# Patient Record
Sex: Male | Born: 1940 | Race: Black or African American | Hispanic: No | Marital: Married | Smoking: Former smoker
Health system: Southern US, Community
[De-identification: ages and names within clinical notes are randomized; demographics above are authoritative.]

## PROBLEM LIST (undated history)

## (undated) DIAGNOSIS — G8929 Other chronic pain: Secondary | ICD-10-CM

## (undated) DIAGNOSIS — Z972 Presence of dental prosthetic device (complete) (partial): Secondary | ICD-10-CM

## (undated) DIAGNOSIS — R42 Dizziness and giddiness: Secondary | ICD-10-CM

## (undated) DIAGNOSIS — N529 Male erectile dysfunction, unspecified: Secondary | ICD-10-CM

## (undated) DIAGNOSIS — M549 Dorsalgia, unspecified: Secondary | ICD-10-CM

## (undated) DIAGNOSIS — H409 Unspecified glaucoma: Secondary | ICD-10-CM

## (undated) DIAGNOSIS — Z905 Acquired absence of kidney: Secondary | ICD-10-CM

## (undated) DIAGNOSIS — C679 Malignant neoplasm of bladder, unspecified: Secondary | ICD-10-CM

## (undated) DIAGNOSIS — I10 Essential (primary) hypertension: Secondary | ICD-10-CM

## (undated) DIAGNOSIS — J309 Allergic rhinitis, unspecified: Secondary | ICD-10-CM

## (undated) DIAGNOSIS — M48061 Spinal stenosis, lumbar region without neurogenic claudication: Secondary | ICD-10-CM

## (undated) DIAGNOSIS — S36113A Laceration of liver, unspecified degree, initial encounter: Secondary | ICD-10-CM

## (undated) DIAGNOSIS — C61 Malignant neoplasm of prostate: Secondary | ICD-10-CM

## (undated) DIAGNOSIS — N400 Enlarged prostate without lower urinary tract symptoms: Secondary | ICD-10-CM

## (undated) HISTORY — PX: TRANSURETHRAL RESECTION OF BLADDER TUMOR WITH GYRUS (TURBT-GYRUS): SHX6458

## (undated) HISTORY — PX: BACK SURGERY: SHX140

## (undated) HISTORY — PX: COLONOSCOPY: SHX174

## (undated) HISTORY — PX: HERNIA REPAIR: SHX51

---

## 1976-11-16 HISTORY — PX: NEPHRECTOMY: SHX65

## 2004-09-05 ENCOUNTER — Other Ambulatory Visit: Payer: Self-pay

## 2004-09-10 ENCOUNTER — Ambulatory Visit: Payer: Self-pay | Admitting: Surgery

## 2005-02-14 DIAGNOSIS — Z86010 Personal history of colonic polyps: Secondary | ICD-10-CM | POA: Insufficient documentation

## 2005-03-13 ENCOUNTER — Ambulatory Visit: Payer: Self-pay | Admitting: Gastroenterology

## 2006-03-01 ENCOUNTER — Ambulatory Visit: Payer: Self-pay | Admitting: Urology

## 2006-03-01 ENCOUNTER — Other Ambulatory Visit: Payer: Self-pay

## 2006-03-10 ENCOUNTER — Ambulatory Visit: Payer: Self-pay | Admitting: Urology

## 2007-09-12 ENCOUNTER — Ambulatory Visit: Payer: Self-pay | Admitting: Urology

## 2008-03-27 ENCOUNTER — Other Ambulatory Visit: Payer: Self-pay

## 2008-03-27 ENCOUNTER — Ambulatory Visit: Payer: Self-pay | Admitting: Urology

## 2008-04-10 ENCOUNTER — Ambulatory Visit: Payer: Self-pay | Admitting: Urology

## 2008-08-01 ENCOUNTER — Ambulatory Visit: Payer: Self-pay | Admitting: Unknown Physician Specialty

## 2009-04-16 ENCOUNTER — Ambulatory Visit: Payer: Self-pay | Admitting: Radiation Oncology

## 2009-05-06 ENCOUNTER — Ambulatory Visit: Payer: Self-pay | Admitting: Urology

## 2009-05-08 ENCOUNTER — Ambulatory Visit: Payer: Self-pay | Admitting: Radiation Oncology

## 2009-05-16 ENCOUNTER — Ambulatory Visit: Payer: Self-pay | Admitting: Radiation Oncology

## 2009-05-22 ENCOUNTER — Ambulatory Visit: Payer: Self-pay | Admitting: Radiation Oncology

## 2009-06-16 ENCOUNTER — Ambulatory Visit: Payer: Self-pay | Admitting: Radiation Oncology

## 2009-07-17 ENCOUNTER — Ambulatory Visit: Payer: Self-pay | Admitting: Radiation Oncology

## 2009-08-16 ENCOUNTER — Ambulatory Visit: Payer: Self-pay | Admitting: Radiation Oncology

## 2009-09-16 ENCOUNTER — Ambulatory Visit: Payer: Self-pay | Admitting: Radiation Oncology

## 2009-12-17 ENCOUNTER — Ambulatory Visit: Payer: Self-pay | Admitting: Radiation Oncology

## 2010-01-13 ENCOUNTER — Ambulatory Visit: Payer: Self-pay | Admitting: Radiation Oncology

## 2010-01-14 ENCOUNTER — Ambulatory Visit: Payer: Self-pay | Admitting: Radiation Oncology

## 2010-03-16 ENCOUNTER — Ambulatory Visit: Payer: Self-pay | Admitting: Internal Medicine

## 2010-03-23 ENCOUNTER — Ambulatory Visit: Payer: Self-pay | Admitting: Family Medicine

## 2010-06-16 ENCOUNTER — Ambulatory Visit: Payer: Self-pay | Admitting: Radiation Oncology

## 2010-07-14 ENCOUNTER — Ambulatory Visit: Payer: Self-pay | Admitting: Radiation Oncology

## 2010-07-17 ENCOUNTER — Ambulatory Visit: Payer: Self-pay | Admitting: Radiation Oncology

## 2011-07-15 ENCOUNTER — Ambulatory Visit: Payer: Self-pay | Admitting: Radiation Oncology

## 2011-07-16 LAB — PSA: PSA: 0.4 ng/mL (ref 0.0–4.0)

## 2011-07-18 ENCOUNTER — Ambulatory Visit: Payer: Self-pay | Admitting: Radiation Oncology

## 2012-06-22 ENCOUNTER — Ambulatory Visit: Payer: Self-pay | Admitting: Unknown Physician Specialty

## 2012-07-15 ENCOUNTER — Ambulatory Visit: Payer: Self-pay | Admitting: Radiation Oncology

## 2012-07-17 ENCOUNTER — Ambulatory Visit: Payer: Self-pay | Admitting: Radiation Oncology

## 2012-08-03 DIAGNOSIS — M5126 Other intervertebral disc displacement, lumbar region: Secondary | ICD-10-CM | POA: Insufficient documentation

## 2012-09-26 DIAGNOSIS — N529 Male erectile dysfunction, unspecified: Secondary | ICD-10-CM | POA: Insufficient documentation

## 2012-09-26 DIAGNOSIS — Z8551 Personal history of malignant neoplasm of bladder: Secondary | ICD-10-CM | POA: Insufficient documentation

## 2013-07-14 ENCOUNTER — Ambulatory Visit: Payer: Self-pay | Admitting: Radiation Oncology

## 2013-07-15 LAB — PSA: PSA: 1.3 ng/mL (ref 0.0–4.0)

## 2013-07-17 ENCOUNTER — Ambulatory Visit: Payer: Self-pay | Admitting: Radiation Oncology

## 2013-09-27 ENCOUNTER — Ambulatory Visit: Payer: Self-pay | Admitting: Gastroenterology

## 2014-05-23 ENCOUNTER — Ambulatory Visit: Payer: Self-pay | Admitting: Urology

## 2014-06-20 DIAGNOSIS — E785 Hyperlipidemia, unspecified: Secondary | ICD-10-CM | POA: Insufficient documentation

## 2014-07-11 DIAGNOSIS — I10 Essential (primary) hypertension: Secondary | ICD-10-CM | POA: Insufficient documentation

## 2014-07-11 DIAGNOSIS — J309 Allergic rhinitis, unspecified: Secondary | ICD-10-CM | POA: Insufficient documentation

## 2014-07-11 DIAGNOSIS — N400 Enlarged prostate without lower urinary tract symptoms: Secondary | ICD-10-CM | POA: Insufficient documentation

## 2014-07-12 ENCOUNTER — Ambulatory Visit: Payer: Self-pay | Admitting: Radiation Oncology

## 2014-07-12 DIAGNOSIS — G8929 Other chronic pain: Secondary | ICD-10-CM | POA: Insufficient documentation

## 2014-07-12 DIAGNOSIS — M549 Dorsalgia, unspecified: Secondary | ICD-10-CM | POA: Insufficient documentation

## 2014-07-19 ENCOUNTER — Ambulatory Visit: Payer: Self-pay | Admitting: Radiation Oncology

## 2014-08-16 ENCOUNTER — Ambulatory Visit: Payer: Self-pay | Admitting: Radiation Oncology

## 2015-01-11 ENCOUNTER — Ambulatory Visit: Payer: Self-pay | Admitting: Radiation Oncology

## 2015-07-15 ENCOUNTER — Ambulatory Visit: Payer: Medicare Other | Attending: Radiation Oncology | Admitting: Radiation Oncology

## 2015-07-15 ENCOUNTER — Inpatient Hospital Stay: Payer: Medicare Other | Attending: Radiation Oncology

## 2015-07-15 ENCOUNTER — Other Ambulatory Visit: Payer: Medicare Other | Admitting: *Deleted

## 2015-07-15 DIAGNOSIS — C61 Malignant neoplasm of prostate: Secondary | ICD-10-CM

## 2015-07-24 DIAGNOSIS — Z79899 Other long term (current) drug therapy: Secondary | ICD-10-CM | POA: Insufficient documentation

## 2016-02-10 ENCOUNTER — Encounter: Payer: Self-pay | Admitting: Radiation Oncology

## 2016-02-10 ENCOUNTER — Ambulatory Visit
Admission: RE | Admit: 2016-02-10 | Discharge: 2016-02-10 | Disposition: A | Discharge status: Home / Self Care | Payer: Medicare Other | Source: Ambulatory Visit | Attending: Radiation Oncology | Admitting: Radiation Oncology

## 2016-02-10 ENCOUNTER — Inpatient Hospital Stay: Payer: Medicare Other | Attending: Radiation Oncology

## 2016-02-10 ENCOUNTER — Other Ambulatory Visit: Payer: Self-pay | Admitting: *Deleted

## 2016-02-10 VITALS — BP 125/73 | HR 59 | Temp 98.0°F | Wt 222.4 lb

## 2016-02-10 DIAGNOSIS — Z923 Personal history of irradiation: Secondary | ICD-10-CM | POA: Diagnosis not present

## 2016-02-10 DIAGNOSIS — C775 Secondary and unspecified malignant neoplasm of intrapelvic lymph nodes: Secondary | ICD-10-CM | POA: Diagnosis not present

## 2016-02-10 DIAGNOSIS — Z79818 Long term (current) use of other agents affecting estrogen receptors and estrogen levels: Secondary | ICD-10-CM | POA: Insufficient documentation

## 2016-02-10 DIAGNOSIS — C61 Malignant neoplasm of prostate: Secondary | ICD-10-CM | POA: Insufficient documentation

## 2016-02-10 HISTORY — DX: Essential (primary) hypertension: I10

## 2016-02-10 HISTORY — DX: Allergic rhinitis, unspecified: J30.9

## 2016-02-10 HISTORY — DX: Other chronic pain: G89.29

## 2016-02-10 HISTORY — DX: Malignant neoplasm of prostate: C61

## 2016-02-10 HISTORY — DX: Spinal stenosis, lumbar region without neurogenic claudication: M48.061

## 2016-02-10 HISTORY — DX: Dorsalgia, unspecified: M54.9

## 2016-02-10 HISTORY — DX: Male erectile dysfunction, unspecified: N52.9

## 2016-02-10 HISTORY — DX: Malignant neoplasm of bladder, unspecified: C67.9

## 2016-02-10 HISTORY — DX: Benign prostatic hyperplasia without lower urinary tract symptoms: N40.0

## 2016-02-10 LAB — PSA: PSA: 3.64 ng/mL (ref 0.00–4.00)

## 2016-02-10 MED ORDER — TAMSULOSIN HCL 0.4 MG PO CAPS: 0.4000 mg | ORAL_CAPSULE | Freq: Every day | ORAL | Status: DC

## 2016-02-10 NOTE — Progress Notes (Signed)
Radiation Oncology Follow up Note  Name: James Lucas   Date:   02/10/2016 MRN:  [id number] DOB: [birth year]    This 75 y.o. 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 He is seen today in routine follow-up. About a year ago he started having progression of his PSA we started him on Lupron 4 month Depo. Bone scan at that time showed no evidence of metastatic disease. Patient's PSA has slowly climbed last was 3.71 in January 2017. He also having some urinary frequency urgency and nocturia 2-3.  COMPLICATIONS OF TREATMENT: none  FOLLOW UP COMPLIANCE: keeps appointments   PHYSICAL EXAM:  BP 125/73 mmHg  Pulse 59  Temp(Src) 98 F (36.7 C)  Wt 222 lb 7.1 oz (100.9 kg) On rectal exam rectal sphincter tone is good. Prostate is smooth contracted without evidence of nodularity or mass. Sulcus is preserved bilaterally. No discrete nodularity is identified. No other rectal abnormalities are noted. Well-developed well-nourished patient in NAD. HEENT reveals PERLA, EOMI, discs not visualized.  Oral cavity is clear. No oral mucosal lesions are identified. Neck is clear without evidence of cervical or supraclavicular adenopathy. Lungs are clear to A&P. Cardiac examination is essentially unremarkable with regular rate and rhythm without murmur rub or thrill. Abdomen is benign with no organomegaly or masses noted. Motor sensory and DTR levels are equal and symmetric in the upper and lower extremities. Cranial nerves II through XII are grossly intact. Proprioception is intact. No peripheral adenopathy or edema is identified. No motor or sensory levels are noted. Crude visual fields are within normal  range.  RADIOLOGY RESULTS: No current films for review  PLAN: At this time patient is not on Lupron therapy is being followed by urologists in [place]. I am concerned about his tripling of PSA over the past year. Would opt to pulse him with Lupron therapy although he is under the care of urologists in [place] and they are following this. He has another PSA schedule in 3 months. I've asked to see him shortly thereafter that for the consideration of his treatment plan. He's having no bony pain at this time. I'm starting him on Flomax to help release some of his nocturia and frequency issues. Patient is to call sooner with any concerns.  I would like to take this opportunity for allowing me to participate in the care of your patient.Armstead Peaks., MD

## 2016-06-12 ENCOUNTER — Ambulatory Visit
Admission: RE | Admit: 2016-06-12 | Discharge: 2016-06-12 | Disposition: A | Discharge status: Home / Self Care | Payer: Medicare Other | Source: Ambulatory Visit | Attending: Radiation Oncology | Admitting: Radiation Oncology

## 2016-06-12 ENCOUNTER — Encounter: Payer: Self-pay | Admitting: Radiation Oncology

## 2016-06-12 VITALS — BP 141/75 | HR 55 | Temp 96.6°F | Resp 20 | Ht 72.0 in | Wt 220.0 lb

## 2016-06-12 DIAGNOSIS — C779 Secondary and unspecified malignant neoplasm of lymph node, unspecified: Secondary | ICD-10-CM | POA: Insufficient documentation

## 2016-06-12 DIAGNOSIS — K5900 Constipation, unspecified: Secondary | ICD-10-CM | POA: Diagnosis not present

## 2016-06-12 DIAGNOSIS — R351 Nocturia: Secondary | ICD-10-CM | POA: Diagnosis not present

## 2016-06-12 DIAGNOSIS — C61 Malignant neoplasm of prostate: Secondary | ICD-10-CM | POA: Diagnosis present

## 2016-06-12 DIAGNOSIS — R9721 Rising PSA following treatment for malignant neoplasm of prostate: Secondary | ICD-10-CM | POA: Insufficient documentation

## 2016-06-12 NOTE — Progress Notes (Signed)
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 In 2016 he was noted to have progression of PSA and was started on Lupron. Bone scan was negative. PSA has continued to climb (see below) and most recently ws 5.5 on 04/22/16. At his last visit he was started on flomax for urgency and nocturia, he took this for about a month but stopped because he did not notice much improvement. He saw his urologist in [place] about a month ago and was urged to consider medonc referral but has not made an appointment yet. He denies new abdominal or bony pain, change in his urinary or bowel symptoms. He is occasionally constipated, denies BRBPR, has occasinoal urgency, nocturia x 3, and denies hematuria.  COMPLICATIONS OF TREATMENT: none  FOLLOW UP COMPLIANCE: keeps appointments   MEDS: reviewed as documented  EXAM: Vitals:   06/12/16 1035  BP: (!) 141/75  Pulse: (!) 55  Resp: 20  Temp: (!) 96.6 F (35.9 C)   Awake, alert, NAD. PERLA, EOMI, Oral cavity clear. Neck supple with LAD. Lungs clear. abd s/nt/nd/nabs. No pain with axial percussion. On rectal exam - sphincter tone is good. Prostate is smooth without nodularity or mass. No abnormalities noted. MEDICAL DECISION MAKING LABS: PSA history: 04/22/16  5.48 02/10/16  3.64 07/12/14 2.32 07/14/13 1.3 07/15/12 0.6 07/15/11 0.4  ASSESSMENT: 1. Prostate CA - continuing rise in PSA c/w biochemical recurrence. Not on active therapy currently.   PLAN:  1. Prostate CA - patient is amenable to seeing medical oncology to discuss current treatment options. Age and other comorbidities may limit options. Paitent has not heard back from [facility] and would prefer to make appointment here. Will make referral to medical oncology. 2. followup - followup  in 6 months  James Darting, MD PhD

## 2016-07-08 ENCOUNTER — Ambulatory Visit: Payer: Medicare Other | Admitting: Oncology

## 2016-07-09 DIAGNOSIS — C61 Malignant neoplasm of prostate: Secondary | ICD-10-CM | POA: Insufficient documentation

## 2016-07-09 NOTE — Progress Notes (Signed)
[facility]  [phone] Fax:(336) [phone]  ID: James Lucas OB: 1941-09-30  MR#: [id number]  [id number]  Patient Care Team: Ezequiel Kayser, MD as PCP - General (Internal Medicine)  CHIEF COMPLAINT: Prostate Cancer  INTERVAL HISTORY: Patient is a 75 year old male with a long-standing history of prostate cancer status post XRT was noted to have a slowly increasing PSA. Patient had been on Lupron before, but discontinued secondary to hot flashes. He currently feels well and is asymptomatic. He has no neurologic complaints. He denies any recent fevers or illnesses. He has a good appetite and denies weight loss. He denies any pain. He has no chest pain or shortness of breath. He denies any nausea, vomiting, constipation, or diarrhea. He has no urinary complaints. Patient feels at his baseline and offers no specific complaints today.  REVIEW OF SYSTEMS:   Review of Systems  Constitutional: Negative.  Negative for fever, malaise/fatigue and weight loss.  Respiratory: Negative.  Negative for cough and shortness of breath.   Cardiovascular: Negative.  Negative for chest pain.  Gastrointestinal: Negative.  Negative for abdominal pain.  Genitourinary: Negative.   Musculoskeletal: Negative.   Neurological: Negative.  Negative for weakness.  Psychiatric/Behavioral: Negative.     As per HPI. Otherwise, a complete review of systems is negatve.  PAST MEDICAL HISTORY: Past Medical History:  Diagnosis Date  . Allergic rhinitis   . Bladder cancer ([place])   . BPH (benign prostatic hyperplasia)   . Chronic back pain   . Essential hypertension   . Impotence of organic origin   . Prostate cancer ([place])   . Stenosis, spinal, lumbar     PAST SURGICAL HISTORY: No past surgical history on file.  FAMILY HISTORY: Reviewed and unchanged. No reported history of malignancy or chronic disease.     ADVANCED DIRECTIVES (Y/N):  N   HEALTH MAINTENANCE: Social History    Substance Use Topics  . Smoking status: Former Research scientist (life sciences)  . Smokeless tobacco: Never Used  . Alcohol use No     Colonoscopy:  PAP:  Bone density:  Lipid panel:  Allergies  Allergen Reactions  . Ace Inhibitors Cough    Current Outpatient Prescriptions  Medication Sig Dispense Refill  . aspirin EC 81 MG tablet Take 81 mg by mouth daily.     . bimatoprost (LUMIGAN) 0.01 % SOLN Apply 1 drop to eye at bedtime.     . fluticasone (FLONASE) 50 MCG/ACT nasal spray Place 1 spray into both nostrils daily as needed.     . hydrochlorothiazide (HYDRODIURIL) 25 MG tablet Take 25 mg by mouth daily.     . montelukast (SINGULAIR) 10 MG tablet Take 10 mg by mouth at bedtime.      No current facility-administered medications for this visit.     OBJECTIVE: Vitals:   07/10/16 1148  BP: (!) 148/79  Pulse: 62  Resp: 18  Temp: 97.2 F (36.2 C)     Body mass index is 29.82 kg/m.    ECOG FS:0 - Asymptomatic  General: Well-developed, well-nourished, no acute distress. Eyes: Pink conjunctiva, anicteric sclera. HEENT: Normocephalic, moist mucous membranes, clear oropharnyx. Lungs: Clear to auscultation bilaterally. Heart: Regular rate and rhythm. No rubs, murmurs, or gallops. Abdomen: Soft, nontender, nondistended. No organomegaly noted, normoactive bowel sounds. Musculoskeletal: No edema, cyanosis, or clubbing. Neuro: Alert, answering all questions appropriately. Cranial nerves grossly intact. Skin: No rashes or petechiae noted. Psych: Normal affect. Lymphatics: No cervical, calvicular, axillary or inguinal LAD.   LAB RESULTS:  No results  found for: NA, K, CL, CO2, GLUCOSE, BUN, CREATININE, CALCIUM, PROT, ALBUMIN, AST, ALT, ALKPHOS, BILITOT, GFRNONAA, GFRAA  No results found for: WBC, NEUTROABS, HGB, HCT, MCV, PLT  Lab Results  Component Value Date   PSA 3.64 02/10/2016     STUDIES: No results found.  ASSESSMENT: Prostate Cancer  PLAN:    1. Prostate Cancer: Patient initially  received cryotherapy followed by adjuvant XRT for his Gleason's 9 adenocarcinoma in approximately 2010. In 2016 he was noted to have increasing PSA and was initiated on Lupron. This was discontinued secondary to hot flashes and significant side effects. Patient now noted to have an increasing PSA again and his most recent result was 3.64. Patient had a nuclear medicine bone scan as well as CT data and pelvis in June 2017 at [facility] that did not report any distinct pathology. Since patient has a chemical recurrence of his prostate cancer, he has agreed to reinitiate Lupron. Patient had significant hot flashes previously therefore will give him a 3 month injection. If his hot flashes are intolerable, we will consider switching treatment to either Xtandi or Zytiga. Return to clinic next week for Lupron and then 3 months for Lupron and further evaluation.  Approximately 45 minutes was spent in discussion of which greater than 50% was consultation.   Patient expressed understanding and was in agreement with this plan. He also understands that He can call clinic at any time with any questions, concerns, or complaints.   No matching staging information was found for the patient.  Lloyd Huger, MD   07/13/2016 2:35 PM

## 2016-07-10 ENCOUNTER — Inpatient Hospital Stay: Payer: Medicare Other | Attending: Oncology | Admitting: Oncology

## 2016-07-10 DIAGNOSIS — Z87891 Personal history of nicotine dependence: Secondary | ICD-10-CM | POA: Diagnosis not present

## 2016-07-10 DIAGNOSIS — Z923 Personal history of irradiation: Secondary | ICD-10-CM

## 2016-07-10 DIAGNOSIS — Z8551 Personal history of malignant neoplasm of bladder: Secondary | ICD-10-CM | POA: Diagnosis not present

## 2016-07-10 DIAGNOSIS — M4806 Spinal stenosis, lumbar region: Secondary | ICD-10-CM

## 2016-07-10 DIAGNOSIS — I10 Essential (primary) hypertension: Secondary | ICD-10-CM

## 2016-07-10 DIAGNOSIS — G8929 Other chronic pain: Secondary | ICD-10-CM

## 2016-07-10 DIAGNOSIS — M549 Dorsalgia, unspecified: Secondary | ICD-10-CM

## 2016-07-10 DIAGNOSIS — Z79899 Other long term (current) drug therapy: Secondary | ICD-10-CM

## 2016-07-10 DIAGNOSIS — C61 Malignant neoplasm of prostate: Secondary | ICD-10-CM | POA: Diagnosis present

## 2016-07-10 DIAGNOSIS — R9721 Rising PSA following treatment for malignant neoplasm of prostate: Secondary | ICD-10-CM

## 2016-07-10 DIAGNOSIS — Z7982 Long term (current) use of aspirin: Secondary | ICD-10-CM | POA: Diagnosis not present

## 2016-07-10 DIAGNOSIS — N529 Male erectile dysfunction, unspecified: Secondary | ICD-10-CM

## 2016-07-10 NOTE — Progress Notes (Signed)
New referral from Dr. Baruch Gouty for rising PSA. Offers no complaints.

## 2016-07-17 ENCOUNTER — Inpatient Hospital Stay: Payer: Medicare Other | Attending: Oncology

## 2016-07-17 ENCOUNTER — Inpatient Hospital Stay: Payer: Medicare Other

## 2016-07-17 VITALS — BP 142/68 | HR 73 | Temp 96.8°F | Resp 18

## 2016-07-17 DIAGNOSIS — Z79818 Long term (current) use of other agents affecting estrogen receptors and estrogen levels: Secondary | ICD-10-CM | POA: Diagnosis not present

## 2016-07-17 DIAGNOSIS — C61 Malignant neoplasm of prostate: Secondary | ICD-10-CM | POA: Insufficient documentation

## 2016-07-17 LAB — BASIC METABOLIC PANEL
Anion gap: 6 (ref 5–15)
BUN: 10 mg/dL (ref 6–20)
CALCIUM: 9.1 mg/dL (ref 8.9–10.3)
CHLORIDE: 103 mmol/L (ref 101–111)
CO2: 32 mmol/L (ref 22–32)
CREATININE: 1.05 mg/dL (ref 0.61–1.24)
GFR calc non Af Amer: >60 mL/min (ref >60)
Glucose, Bld: 94 mg/dL (ref 65–99)
Potassium: 4.3 mmol/L (ref 3.5–5.1)
SODIUM: 141 mmol/L (ref 135–145)

## 2016-07-17 MED ORDER — LEUPROLIDE ACETATE (3 MONTH) 22.5 MG IM KIT
22.5000 mg | PACK | Freq: Once | INTRAMUSCULAR | Status: AC
Start: 2016-07-17 — End: 2016-07-17
  Administered 2016-07-17: 22.5 mg via INTRAMUSCULAR
  Filled 2016-07-17: qty 22.5

## 2016-09-22 ENCOUNTER — Inpatient Hospital Stay: Payer: Medicare Other | Attending: Oncology

## 2016-09-22 DIAGNOSIS — Z8551 Personal history of malignant neoplasm of bladder: Secondary | ICD-10-CM | POA: Diagnosis not present

## 2016-09-22 DIAGNOSIS — C61 Malignant neoplasm of prostate: Secondary | ICD-10-CM | POA: Insufficient documentation

## 2016-09-22 DIAGNOSIS — I10 Essential (primary) hypertension: Secondary | ICD-10-CM | POA: Diagnosis not present

## 2016-09-22 DIAGNOSIS — Z79899 Other long term (current) drug therapy: Secondary | ICD-10-CM | POA: Insufficient documentation

## 2016-09-22 DIAGNOSIS — R232 Flushing: Secondary | ICD-10-CM | POA: Insufficient documentation

## 2016-09-22 DIAGNOSIS — Z923 Personal history of irradiation: Secondary | ICD-10-CM | POA: Diagnosis not present

## 2016-09-22 DIAGNOSIS — M549 Dorsalgia, unspecified: Secondary | ICD-10-CM | POA: Insufficient documentation

## 2016-09-22 DIAGNOSIS — Z87891 Personal history of nicotine dependence: Secondary | ICD-10-CM | POA: Insufficient documentation

## 2016-09-22 DIAGNOSIS — M48061 Spinal stenosis, lumbar region without neurogenic claudication: Secondary | ICD-10-CM | POA: Diagnosis not present

## 2016-09-22 DIAGNOSIS — N400 Enlarged prostate without lower urinary tract symptoms: Secondary | ICD-10-CM | POA: Insufficient documentation

## 2016-09-22 DIAGNOSIS — G8929 Other chronic pain: Secondary | ICD-10-CM | POA: Insufficient documentation

## 2016-09-22 DIAGNOSIS — Z79818 Long term (current) use of other agents affecting estrogen receptors and estrogen levels: Secondary | ICD-10-CM | POA: Insufficient documentation

## 2016-09-22 DIAGNOSIS — Z7982 Long term (current) use of aspirin: Secondary | ICD-10-CM | POA: Diagnosis not present

## 2016-09-22 LAB — BASIC METABOLIC PANEL
ANION GAP: 7 (ref 5–15)
BUN: 12 mg/dL (ref 6–20)
CHLORIDE: 100 mmol/L — AB (ref 101–111)
CO2: 31 mmol/L (ref 22–32)
Calcium: 9.1 mg/dL (ref 8.9–10.3)
Creatinine, Ser: 0.94 mg/dL (ref 0.61–1.24)
GFR calc Af Amer: >60 mL/min (ref >60)
GFR calc non Af Amer: >60 mL/min (ref >60)
GLUCOSE: 148 mg/dL — AB (ref 65–99)
POTASSIUM: 3.8 mmol/L (ref 3.5–5.1)
Sodium: 138 mmol/L (ref 135–145)

## 2016-09-22 LAB — PSA: PSA: 0.55 ng/mL (ref 0.00–4.00)

## 2016-09-24 ENCOUNTER — Inpatient Hospital Stay: Payer: Medicare Other

## 2016-09-24 ENCOUNTER — Inpatient Hospital Stay (HOSPITAL_BASED_OUTPATIENT_CLINIC_OR_DEPARTMENT_OTHER): Payer: Medicare Other | Admitting: Oncology

## 2016-09-24 VITALS — BP 160/71 | HR 63 | Temp 96.9°F | Resp 18 | Wt 216.8 lb

## 2016-09-24 DIAGNOSIS — Z79818 Long term (current) use of other agents affecting estrogen receptors and estrogen levels: Secondary | ICD-10-CM

## 2016-09-24 DIAGNOSIS — C61 Malignant neoplasm of prostate: Secondary | ICD-10-CM

## 2016-09-24 DIAGNOSIS — N400 Enlarged prostate without lower urinary tract symptoms: Secondary | ICD-10-CM

## 2016-09-24 DIAGNOSIS — R232 Flushing: Secondary | ICD-10-CM | POA: Diagnosis not present

## 2016-09-24 DIAGNOSIS — Z87891 Personal history of nicotine dependence: Secondary | ICD-10-CM

## 2016-09-24 DIAGNOSIS — M549 Dorsalgia, unspecified: Secondary | ICD-10-CM

## 2016-09-24 DIAGNOSIS — Z7982 Long term (current) use of aspirin: Secondary | ICD-10-CM

## 2016-09-24 DIAGNOSIS — I10 Essential (primary) hypertension: Secondary | ICD-10-CM

## 2016-09-24 DIAGNOSIS — Z923 Personal history of irradiation: Secondary | ICD-10-CM | POA: Diagnosis not present

## 2016-09-24 DIAGNOSIS — G8929 Other chronic pain: Secondary | ICD-10-CM

## 2016-09-24 DIAGNOSIS — Z79899 Other long term (current) drug therapy: Secondary | ICD-10-CM

## 2016-09-24 DIAGNOSIS — M48061 Spinal stenosis, lumbar region without neurogenic claudication: Secondary | ICD-10-CM

## 2016-09-24 DIAGNOSIS — Z8551 Personal history of malignant neoplasm of bladder: Secondary | ICD-10-CM

## 2016-09-24 NOTE — Progress Notes (Signed)
States is feeling well. Offers no complaints. 

## 2016-09-24 NOTE — Progress Notes (Addendum)
[facility]  Telephone:(336) [phone] Fax:([phone]) [phone]  ID: James Lucas OB: June 06, 1941  MR#: [id number]  [id number]  Patient Care Team: Ezequiel Kayser, MD as PCP - General (Internal Medicine)  CHIEF COMPLAINT: Prostate Cancer  INTERVAL HISTORY: Patient returns to clinic for further evaluation and continuation of Lupron.  He continues to have hot flashes, but states they are tolerable. He currently feels well and is asymptomatic. He has no neurologic complaints. He denies any recent fevers or illnesses. He has a good appetite and denies weight loss. He denies any pain. He has no chest pain or shortness of breath. He denies any nausea, vomiting, constipation, or diarrhea. He has no urinary complaints. Patient offers no further specific complaints today.  REVIEW OF SYSTEMS:   Review of Systems  Constitutional: Negative.  Negative for fever, malaise/fatigue and weight loss.  Respiratory: Negative.  Negative for cough and shortness of breath.   Cardiovascular: Negative.  Negative for chest pain and leg swelling.  Gastrointestinal: Negative.  Negative for abdominal pain.  Genitourinary: Negative.   Musculoskeletal: Negative.   Neurological: Positive for sensory change. Negative for weakness.  Psychiatric/Behavioral: Negative.  The patient is not nervous/anxious.     As per HPI. Otherwise, a complete review of systems is negative.  PAST MEDICAL HISTORY: Past Medical History:  Diagnosis Date  . Allergic rhinitis   . Bladder cancer ([place])   . BPH (benign prostatic hyperplasia)   . Chronic back pain   . Essential hypertension   . Impotence of organic origin   . Prostate cancer ([place])   . Stenosis, spinal, lumbar     PAST SURGICAL HISTORY: No past surgical history on file.  FAMILY HISTORY: Reviewed and unchanged. No reported history of malignancy or chronic disease.     ADVANCED DIRECTIVES (Y/N):  N   HEALTH MAINTENANCE: Social History  Substance Use  Topics  . Smoking status: Former Research scientist (life sciences)  . Smokeless tobacco: Never Used  . Alcohol use No     Colonoscopy:  PAP:  Bone density:  Lipid panel:  Allergies  Allergen Reactions  . Ace Inhibitors Cough    Current Outpatient Prescriptions  Medication Sig Dispense Refill  . aspirin EC 81 MG tablet Take 81 mg by mouth daily.     . bimatoprost (LUMIGAN) 0.01 % SOLN Apply 1 drop to eye at bedtime.     . fluticasone (FLONASE) 50 MCG/ACT nasal spray Place 1 spray into both nostrils daily as needed.     . hydrochlorothiazide (HYDRODIURIL) 25 MG tablet Take 25 mg by mouth daily.     . montelukast (SINGULAIR) 10 MG tablet Take 10 mg by mouth at bedtime.      No current facility-administered medications for this visit.     OBJECTIVE: Vitals:   09/24/16 0945  BP: (!) 160/71  Pulse: 63  Resp: 18  Temp: (!) 96.9 F (36.1 C)     Body mass index is 29.41 kg/m.    ECOG FS:0 - Asymptomatic  General: Well-developed, well-nourished, no acute distress. Eyes: Pink conjunctiva, anicteric sclera. Lungs: Clear to auscultation bilaterally. Heart: Regular rate and rhythm. No rubs, murmurs, or gallops. Abdomen: Soft, nontender, nondistended. No organomegaly noted, normoactive bowel sounds. Musculoskeletal: No edema, cyanosis, or clubbing. Neuro: Alert, answering all questions appropriately. Cranial nerves grossly intact. Skin: No rashes or petechiae noted. Psych: Normal affect.   LAB RESULTS:  Lab Results  Component Value Date   NA 138 09/22/2016   K 3.8 09/22/2016   CL 100 (L) 09/22/2016  CO2 31 09/22/2016   GLUCOSE 148 (H) 09/22/2016   BUN 12 09/22/2016   CREATININE 0.94 09/22/2016   CALCIUM 9.1 09/22/2016   GFRNONAA >60 09/22/2016   GFRAA >60 09/22/2016    No results found for: WBC, NEUTROABS, HGB, HCT, MCV, PLT  Lab Results  Component Value Date   PSA 0.55 09/22/2016     STUDIES: No results found.  ASSESSMENT: Prostate Cancer  PLAN:    1. Prostate Cancer: Patient  initially received cryotherapy followed by adjuvant XRT for his Gleason's 9 adenocarcinoma in approximately 2010. In 2016 he was noted to have increasing PSA and was initiated on Lupron. This was discontinued secondary to hot flashes and significant side effects. He had a nuclear medicine bone scan as well as CT data and pelvis in June 2017 at [facility] that did not report any distinct pathology. Continue with Lupron. He will return to clinic on October 16, 2016 for his next injection.  He will then return to clinic four months later for further evaluation and Lupron. If his hot flashes are intolerable, we will consider switching treatment to either Xtandi or Zytiga.  2. Hot flashes: Secondary to Lupron. Monitor. 3. Hypertension: Continue with Lupron. Further evaluation and treatment per PCP.   Patient expressed understanding and was in agreement with this plan. He also understands that He can call clinic at any time with any questions, concerns, or complaints.   Patient was seen and evaluated by Dr. Beryle Lathe at [facility] on October 26, 2016. He has recommended to discontinue Lupron since his PSA is less than 4.0 and pursue intermittent therapy when PSA increases to about 10. He also recommend checking testosterone and PSA every 2 months. Finally, consider PET/CT with F-18-fluciclovine to assess for oligometastatic disease if PSA begins to increase.  No matching staging information was found for the patient.  Lloyd Huger, MD   09/27/2016 10:27 PM

## 2016-09-25 ENCOUNTER — Ambulatory Visit: Payer: Medicare Other

## 2016-09-25 ENCOUNTER — Ambulatory Visit: Payer: Medicare Other | Admitting: Oncology

## 2016-10-16 ENCOUNTER — Ambulatory Visit: Payer: Medicare Other

## 2016-12-10 ENCOUNTER — Ambulatory Visit
Admission: RE | Admit: 2016-12-10 | Discharge: 2016-12-10 | Disposition: A | Discharge status: Home / Self Care | Payer: Medicare Other | Source: Ambulatory Visit | Attending: Radiation Oncology | Admitting: Radiation Oncology

## 2016-12-10 DIAGNOSIS — C61 Malignant neoplasm of prostate: Secondary | ICD-10-CM

## 2016-12-10 NOTE — Progress Notes (Signed)
Radiation Oncology Follow up Note  Name: James Lucas   Date:   12/10/2016 MRN:  [id number] DOB: Aug 04, 1941    This 76 y.o. male presents to the clinic today for a year follow-up for prostate cancer currently on pulse Lupron therapy.  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 He had developed approximate year ago rise in his PSA was started on Lupron therapy. He's been seen at for second opinion at [facility] and they have recommended pulse Lupron therapy his last PSA was 0.55 in November 2017. He specifically denies diarrhea dysuria or any other GI/GU complaints. He is having no bone pain..  COMPLICATIONS OF TREATMENT: none  FOLLOW UP COMPLIANCE: keeps appointments   PHYSICAL EXAM:  There were no vitals taken for this visit. Well-developed well-nourished patient in NAD. HEENT reveals PERLA, EOMI, discs not visualized.  Oral cavity is clear. No oral mucosal lesions are identified. Neck is clear without evidence of cervical or supraclavicular adenopathy. Lungs are clear to A&P. Cardiac examination is essentially unremarkable with regular rate and rhythm without murmur rub or thrill. Abdomen is benign with no organomegaly or masses noted. Motor sensory and DTR levels are equal and symmetric in the upper and lower extremities. Cranial nerves II through XII are grossly intact. Proprioception is intact. No peripheral adenopathy or edema is identified. No motor or sensory levels are noted. Crude visual fields are within normal range.  RADIOLOGY RESULTS: No current films for review  PLAN: At this time is under medical oncology's direction receiving pulse Lupron therapy. I'm please was overall progress. I'm going to discontinue follow-up care and turn follow-up care over to medical oncology. We'll be happy to reevaluate the  patient any time should further treatment be indicated.  I would like to take this opportunity to thank you for allowing me to participate in the care of your patient.James Lucas., MD

## 2016-12-11 ENCOUNTER — Inpatient Hospital Stay: Payer: Medicare Other | Attending: Oncology

## 2016-12-11 VITALS — BP 150/75 | HR 72 | Temp 96.3°F

## 2016-12-11 DIAGNOSIS — Z79818 Long term (current) use of other agents affecting estrogen receptors and estrogen levels: Secondary | ICD-10-CM | POA: Diagnosis not present

## 2016-12-11 DIAGNOSIS — C61 Malignant neoplasm of prostate: Secondary | ICD-10-CM | POA: Diagnosis present

## 2016-12-11 MED ORDER — LEUPROLIDE ACETATE (4 MONTH) 30 MG IM KIT
30.0000 mg | PACK | Freq: Once | INTRAMUSCULAR | Status: AC
  Administered 2016-12-11: 30 mg via INTRAMUSCULAR
  Filled 2016-12-11: qty 30

## 2017-01-21 DIAGNOSIS — K5904 Chronic idiopathic constipation: Secondary | ICD-10-CM | POA: Insufficient documentation

## 2017-01-25 ENCOUNTER — Other Ambulatory Visit: Payer: Medicare Other

## 2017-01-27 ENCOUNTER — Other Ambulatory Visit: Payer: Medicare Other

## 2017-01-29 ENCOUNTER — Ambulatory Visit: Payer: Medicare Other

## 2017-01-29 ENCOUNTER — Ambulatory Visit: Payer: Medicare Other | Admitting: Oncology

## 2017-02-16 ENCOUNTER — Other Ambulatory Visit: Payer: Self-pay | Admitting: *Deleted

## 2017-02-16 ENCOUNTER — Ambulatory Visit
Admission: EM | Admit: 2017-02-16 | Discharge: 2017-02-16 | Disposition: A | Discharge status: Home / Self Care | Payer: Medicare Other | Attending: Family Medicine | Admitting: Family Medicine

## 2017-02-16 DIAGNOSIS — M25511 Pain in right shoulder: Secondary | ICD-10-CM

## 2017-02-16 DIAGNOSIS — C61 Malignant neoplasm of prostate: Secondary | ICD-10-CM

## 2017-02-16 MED ORDER — CYCLOBENZAPRINE HCL 10 MG PO TABS: 10.0000 mg | ORAL_TABLET | Freq: Every day | ORAL | 0 refills | Status: DC

## 2017-02-16 MED ORDER — PREDNISONE 20 MG PO TABS: 20.0000 mg | ORAL_TABLET | Freq: Every day | ORAL | 0 refills | Status: DC

## 2017-02-16 MED ORDER — HYDROCODONE-ACETAMINOPHEN 5-325 MG PO TABS: ORAL_TABLET | ORAL | 0 refills | Status: DC

## 2017-02-16 NOTE — ED Provider Notes (Signed)
[facility]    CSN: [id number] Arrival date & time: 02/16/17  1000     History   Chief Complaint Chief Complaint  Patient presents with  . Shoulder Pain    Right    HPI James Lucas is a 76 y.o. male.    Shoulder Pain  Location:  Shoulder Shoulder location:  R shoulder Injury: no   Pain details:    Quality:  Aching   Severity:  Moderate   Onset quality:  Sudden   Duration:  5 days   Timing:  Constant   Progression:  Unchanged Handedness:  Right-handed Dislocation: no   Foreign body present:  No foreign bodies Prior injury to area:  No Relieved by:  Nothing Ineffective treatments:  Heat Associated symptoms: no back pain, no decreased range of motion, no fatigue, no fever, no muscle weakness, no neck pain, no numbness, no swelling and no tingling   Risk factors: no concern for non-accidental trauma, no known bone disorder, no frequent fractures and no recent illness     Past Medical History:  Diagnosis Date  . Allergic rhinitis   . Bladder cancer ([place])   . BPH (benign prostatic hyperplasia)   . Chronic back pain   . Essential hypertension   . Impotence of organic origin   . Prostate cancer ([place])   . Stenosis, spinal, lumbar     Patient Active Problem List   Diagnosis Date Noted  . Prostate cancer ([place]) 07/09/2016    History reviewed. No pertinent surgical history.     Home Medications    Prior to Admission medications   Medication Sig Start Date End Date Taking? Authorizing Provider  aspirin EC 81 MG tablet Take 81 mg by mouth daily.    Yes Historical Provider, MD  bimatoprost (LUMIGAN) 0.01 % SOLN Apply 1 drop to eye at bedtime.    Yes Historical Provider, MD  fluticasone (FLONASE) 50 MCG/ACT nasal spray Place 1 spray into both nostrils daily as needed.  01/21/16  Yes Historical Provider, MD  hydrochlorothiazide (HYDRODIURIL) 25 MG tablet Take 25 mg by mouth daily.  07/24/15  Yes Historical Provider, MD  montelukast (SINGULAIR) 10 MG  tablet Take 10 mg by mouth at bedtime.  01/21/16  Yes Historical Provider, MD  cyclobenzaprine (FLEXERIL) 10 MG tablet Take 1 tablet (10 mg total) by mouth at bedtime. 02/16/17   Norval Gable, MD  HYDROcodone-acetaminophen (NORCO/VICODIN) 5-325 MG tablet 1-2 tabs po q 8 hours as needed for severe pain 02/16/17   Norval Gable, MD  predniSONE (DELTASONE) 20 MG tablet Take 1 tablet (20 mg total) by mouth daily. 02/16/17   Norval Gable, MD  VENTOLIN HFA 108 [phone] Base) MCG/ACT inhaler  10/05/16   Historical Provider, MD    Family History Family History  Problem Relation Age of Onset  . Cancer Father     Social History Social History  Substance Use Topics  . Smoking status: Former Research scientist (life sciences)  . Smokeless tobacco: Never Used  . Alcohol use No     Allergies   Ace inhibitors   Review of Systems Review of Systems  Constitutional: Negative for fatigue and fever.  Musculoskeletal: Negative for back pain and neck pain.     Physical Exam Triage Vital Signs ED Triage Vitals  Enc Vitals Group     BP 02/16/17 1019 (!) 146/70     Pulse Rate 02/16/17 1019 71     Resp 02/16/17 1019 18     Temp 02/16/17 1019 97.7 F (36.5  C)     Temp Source 02/16/17 1019 Oral     SpO2 02/16/17 1019 99 %     Weight 02/16/17 1017 223 lb (101.2 kg)     Height 02/16/17 1017 6\' 1"  (1.854 m)     Head Circumference --      Peak Flow --      Pain Score 02/16/17 1018 8     Pain Loc --      Pain Edu? --      Excl. in [place]? --    No data found.   Updated Vital Signs BP (!) 146/70 (BP Location: Right Arm)   Pulse 71   Temp 97.7 F (36.5 C) (Oral)   Resp 18   Ht 6\' 1"  (1.854 m)   Wt 223 lb (101.2 kg)   SpO2 99%   BMI 29.42 kg/m   Visual Acuity Right Eye Distance:   Left Eye Distance:   Bilateral Distance:    Right Eye Near:   Left Eye Near:    Bilateral Near:     Physical Exam  Constitutional: He appears well-developed and well-nourished. No distress.  Musculoskeletal:       Right shoulder: He  exhibits spasm. He exhibits normal range of motion, no tenderness, no bony tenderness, no swelling, no effusion, no crepitus, no deformity, no laceration, no pain, normal pulse and normal strength.  Skin: He is not diaphoretic.  Nursing note and vitals reviewed.    UC Treatments / Results  Labs (all labs ordered are listed, but only abnormal results are displayed) Labs Reviewed - No data to display  EKG  EKG Interpretation None       Radiology No results found.  Procedures Procedures (including critical care time)  Medications Ordered in UC Medications - No data to display   Initial Impression / Assessment and Plan / UC Course  I have reviewed the triage vital signs and the nursing notes.  Pertinent labs & imaging results that were available during my care of the patient were reviewed by me and considered in my medical decision making (see chart for details).       Final Clinical Impressions(s) / UC Diagnoses   Final diagnoses:  Right shoulder pain, unspecified chronicity    New Prescriptions Discharge Medication List as of 02/16/2017 11:22 AM    START taking these medications   Details  cyclobenzaprine (FLEXERIL) 10 MG tablet Take 1 tablet (10 mg total) by mouth at bedtime., Starting Tue 02/16/2017, Normal    HYDROcodone-acetaminophen (NORCO/VICODIN) 5-325 MG tablet 1-2 tabs po q 8 hours as needed for severe pain, Print    predniSONE (DELTASONE) 20 MG tablet Take 1 tablet (20 mg total) by mouth daily., Starting Tue 02/16/2017, Normal       1. diagnosis reviewed with patient 2. rx as per orders above; reviewed possible side effects, interactions, risks and benefits  3. Recommend supportive treatment with heat, range of motion exercises 4. Follow-up prn if symptoms worsen or don't improve   Norval Gable, MD 02/16/17 1214

## 2017-02-16 NOTE — ED Triage Notes (Signed)
Pt c/o right shoulder pain, he says the pain is in his neck and in the shoulder blade and radiates down his arm. He mentions that his hand and fingers swell a little sometimes. He had a catch in his neck a few days ago. He isn't able to sleep in the bed. He is more comfortable on the couch.

## 2017-02-17 ENCOUNTER — Inpatient Hospital Stay: Payer: Medicare Other | Attending: Oncology

## 2017-02-17 ENCOUNTER — Other Ambulatory Visit: Payer: Medicare Other

## 2017-02-17 DIAGNOSIS — Z87891 Personal history of nicotine dependence: Secondary | ICD-10-CM | POA: Insufficient documentation

## 2017-02-17 DIAGNOSIS — R232 Flushing: Secondary | ICD-10-CM | POA: Insufficient documentation

## 2017-02-17 DIAGNOSIS — N521 Erectile dysfunction due to diseases classified elsewhere: Secondary | ICD-10-CM | POA: Insufficient documentation

## 2017-02-17 DIAGNOSIS — I10 Essential (primary) hypertension: Secondary | ICD-10-CM | POA: Insufficient documentation

## 2017-02-17 DIAGNOSIS — Z7982 Long term (current) use of aspirin: Secondary | ICD-10-CM | POA: Insufficient documentation

## 2017-02-17 DIAGNOSIS — C61 Malignant neoplasm of prostate: Secondary | ICD-10-CM | POA: Diagnosis not present

## 2017-02-17 DIAGNOSIS — M48061 Spinal stenosis, lumbar region without neurogenic claudication: Secondary | ICD-10-CM | POA: Insufficient documentation

## 2017-02-17 DIAGNOSIS — M549 Dorsalgia, unspecified: Secondary | ICD-10-CM | POA: Diagnosis not present

## 2017-02-17 DIAGNOSIS — Z8551 Personal history of malignant neoplasm of bladder: Secondary | ICD-10-CM | POA: Diagnosis not present

## 2017-02-17 DIAGNOSIS — Z79899 Other long term (current) drug therapy: Secondary | ICD-10-CM | POA: Insufficient documentation

## 2017-02-17 DIAGNOSIS — G8929 Other chronic pain: Secondary | ICD-10-CM | POA: Insufficient documentation

## 2017-02-18 LAB — TESTOSTERONE: Testosterone: <3 ng/dL — ABNORMAL LOW (ref 264–916)

## 2017-02-18 LAB — PSA: PSA: 0.18 ng/mL (ref 0.00–4.00)

## 2017-02-18 NOTE — Progress Notes (Signed)
[place]  Telephone:(336) [phone] Fax:(336) [phone]  ID: Elvis Laufer OB: 12-20-1940  MR#: [id number]  DGL#:[id number]  Patient Care Team: Ezequiel Kayser, MD as PCP - General (Internal Medicine)  CHIEF COMPLAINT: Prostate Cancer  INTERVAL HISTORY: Patient returns to clinic for further evaluation and continuation of Lupron.  He continues to have intermittent hot flashes, but states they are tolerable. He currently feels well and is asymptomatic. He has no neurologic complaints. He denies any recent fevers or illnesses. He has a good appetite and denies weight loss. He denies any pain. He has no chest pain or shortness of breath. He denies any nausea, vomiting, constipation, or diarrhea. He has no urinary complaints. Patient offers no further specific complaints today.  REVIEW OF SYSTEMS:   Review of Systems  Constitutional: Negative.  Negative for fever, malaise/fatigue and weight loss.  Respiratory: Negative.  Negative for cough and shortness of breath.   Cardiovascular: Negative.  Negative for chest pain and leg swelling.  Gastrointestinal: Negative.  Negative for abdominal pain.  Genitourinary: Negative.   Musculoskeletal: Negative.   Neurological: Positive for sensory change. Negative for weakness.  Psychiatric/Behavioral: Negative.  The patient is not nervous/anxious.     As per HPI. Otherwise, a complete review of systems is negative.  PAST MEDICAL HISTORY: Past Medical History:  Diagnosis Date  . Allergic rhinitis   . Bladder cancer ([place])   . BPH (benign prostatic hyperplasia)   . Chronic back pain   . Essential hypertension   . Impotence of organic origin   . Prostate cancer ([place])   . Stenosis, spinal, lumbar     PAST SURGICAL HISTORY: No past surgical history on file.  FAMILY HISTORY: Reviewed and unchanged. No reported history of malignancy or chronic disease.     ADVANCED DIRECTIVES (Y/N):  N   HEALTH MAINTENANCE: Social History    Substance Use Topics  . Smoking status: Former Research scientist (life sciences)  . Smokeless tobacco: Never Used  . Alcohol use No     Colonoscopy:  PAP:  Bone density:  Lipid panel:  Allergies  Allergen Reactions  . Ace Inhibitors Cough    Current Outpatient Prescriptions  Medication Sig Dispense Refill  . aspirin EC 81 MG tablet Take 81 mg by mouth daily.     . bimatoprost (LUMIGAN) 0.01 % SOLN Apply 1 drop to eye at bedtime.     . cyclobenzaprine (FLEXERIL) 10 MG tablet Take 1 tablet (10 mg total) by mouth at bedtime. 30 tablet 0  . fluticasone (FLONASE) 50 MCG/ACT nasal spray Place 1 spray into both nostrils daily as needed.     . hydrochlorothiazide (HYDRODIURIL) 25 MG tablet Take 25 mg by mouth daily.     Marland Kitchen HYDROcodone-acetaminophen (NORCO/VICODIN) 5-325 MG tablet 1-2 tabs po q 8 hours as needed for severe pain 10 tablet 0  . montelukast (SINGULAIR) 10 MG tablet Take 10 mg by mouth daily.     . predniSONE (DELTASONE) 20 MG tablet Take 1 tablet (20 mg total) by mouth daily. 10 tablet 0  . VENTOLIN HFA 108 (90 Base) MCG/ACT inhaler      No current facility-administered medications for this visit.     OBJECTIVE: Vitals:   02/19/17 1030  BP: (!) 152/81  Pulse: 68  Resp: 18  Temp: (!) 96.3 F (35.7 C)     Body mass index is 29.68 kg/m.    ECOG FS:0 - Asymptomatic  General: Well-developed, well-nourished, no acute distress. Eyes: Pink conjunctiva, anicteric sclera. Lungs: Clear to auscultation  bilaterally. Heart: Regular rate and rhythm. No rubs, murmurs, or gallops. Abdomen: Soft, nontender, nondistended. No organomegaly noted, normoactive bowel sounds. Musculoskeletal: No edema, cyanosis, or clubbing. Neuro: Alert, answering all questions appropriately. Cranial nerves grossly intact. Skin: No rashes or petechiae noted. Psych: Normal affect.   LAB RESULTS:  Lab Results  Component Value Date   NA 138 09/22/2016   K 3.8 09/22/2016   CL 100 (L) 09/22/2016   CO2 31 09/22/2016    GLUCOSE 148 (H) 09/22/2016   BUN 12 09/22/2016   CREATININE 0.94 09/22/2016   CALCIUM 9.1 09/22/2016   GFRNONAA >60 09/22/2016   GFRAA >60 09/22/2016    No results found for: WBC, NEUTROABS, HGB, HCT, MCV, PLT  Lab Results  Component Value Date   PSA 0.18 02/17/2017     STUDIES: No results found.  ASSESSMENT: Prostate Cancer  PLAN:    1. Prostate Cancer: Patient initially received cryotherapy followed by adjuvant XRT for his Gleason's 9 adenocarcinoma in approximately 2010. In 2016 he was noted to have increasing PSA and was initiated on Lupron. This was discontinued secondary to hot flashes and significant side effects. He had a nuclear medicine bone scan as well as CT of the abdomen and pelvis in June 2017 at [facility] that did not report any distinct pathology. See recommendation from Dr. Eliane Decree below. Patient's testosterone is less than 3. Will discontinue Lupron at this time. Return to clinic in 3 months with repeat laboratory work and further evaluation. If PSA begins to rise, can either re-initiate Lupron or consider switching treatment to either Xtandi or Zytiga.  2. Hot flashes: Secondary to Lupron. Discontinue treatment as above. 3. Hypertension: Continue evaluation and treatment per PCP.   Patient expressed understanding and was in agreement with this plan. He also understands that He can call clinic at any time with any questions, concerns, or complaints.   Approximately 30 minutes was spent in discussion of which greater than 50% was consultation.  Patient was seen and evaluated by Dr. Beryle Lathe at [facility] on October 26, 2016. He has recommended to discontinue Lupron since his PSA is less than 4.0 and pursue intermittent therapy when PSA increases to about 10. He also recommend checking testosterone and PSA every 2 months. Finally, consider PET/CT with F-18-fluciclovine to assess for oligometastatic disease if PSA begins to increase.   Lloyd Huger, MD   02/25/2017 7:45  PM

## 2017-02-19 ENCOUNTER — Inpatient Hospital Stay (HOSPITAL_BASED_OUTPATIENT_CLINIC_OR_DEPARTMENT_OTHER): Payer: Medicare Other | Admitting: Oncology

## 2017-02-19 ENCOUNTER — Inpatient Hospital Stay: Payer: Medicare Other

## 2017-02-19 VITALS — BP 152/81 | HR 68 | Temp 96.3°F | Resp 18 | Wt 225.0 lb

## 2017-02-19 DIAGNOSIS — Z8551 Personal history of malignant neoplasm of bladder: Secondary | ICD-10-CM

## 2017-02-19 DIAGNOSIS — I10 Essential (primary) hypertension: Secondary | ICD-10-CM

## 2017-02-19 DIAGNOSIS — N521 Erectile dysfunction due to diseases classified elsewhere: Secondary | ICD-10-CM | POA: Diagnosis not present

## 2017-02-19 DIAGNOSIS — G8929 Other chronic pain: Secondary | ICD-10-CM

## 2017-02-19 DIAGNOSIS — M48061 Spinal stenosis, lumbar region without neurogenic claudication: Secondary | ICD-10-CM

## 2017-02-19 DIAGNOSIS — C61 Malignant neoplasm of prostate: Secondary | ICD-10-CM | POA: Diagnosis not present

## 2017-02-19 DIAGNOSIS — R232 Flushing: Secondary | ICD-10-CM

## 2017-02-19 DIAGNOSIS — Z79899 Other long term (current) drug therapy: Secondary | ICD-10-CM

## 2017-02-19 DIAGNOSIS — Z7982 Long term (current) use of aspirin: Secondary | ICD-10-CM

## 2017-02-19 DIAGNOSIS — Z87891 Personal history of nicotine dependence: Secondary | ICD-10-CM | POA: Diagnosis not present

## 2017-02-19 DIAGNOSIS — M549 Dorsalgia, unspecified: Secondary | ICD-10-CM | POA: Diagnosis not present

## 2017-03-03 ENCOUNTER — Other Ambulatory Visit: Payer: Medicare Other

## 2017-03-05 ENCOUNTER — Ambulatory Visit: Payer: Medicare Other | Admitting: Oncology

## 2017-03-05 ENCOUNTER — Ambulatory Visit: Payer: Medicare Other

## 2017-05-18 ENCOUNTER — Inpatient Hospital Stay: Payer: Medicare Other | Attending: Oncology

## 2017-05-18 DIAGNOSIS — Z8551 Personal history of malignant neoplasm of bladder: Secondary | ICD-10-CM | POA: Diagnosis not present

## 2017-05-18 DIAGNOSIS — C61 Malignant neoplasm of prostate: Secondary | ICD-10-CM | POA: Insufficient documentation

## 2017-05-18 DIAGNOSIS — Z87891 Personal history of nicotine dependence: Secondary | ICD-10-CM | POA: Diagnosis not present

## 2017-05-18 DIAGNOSIS — M48061 Spinal stenosis, lumbar region without neurogenic claudication: Secondary | ICD-10-CM | POA: Diagnosis not present

## 2017-05-18 DIAGNOSIS — G8929 Other chronic pain: Secondary | ICD-10-CM | POA: Insufficient documentation

## 2017-05-18 DIAGNOSIS — R232 Flushing: Secondary | ICD-10-CM | POA: Insufficient documentation

## 2017-05-18 DIAGNOSIS — Z7982 Long term (current) use of aspirin: Secondary | ICD-10-CM | POA: Insufficient documentation

## 2017-05-18 DIAGNOSIS — N400 Enlarged prostate without lower urinary tract symptoms: Secondary | ICD-10-CM | POA: Insufficient documentation

## 2017-05-18 DIAGNOSIS — I10 Essential (primary) hypertension: Secondary | ICD-10-CM | POA: Diagnosis not present

## 2017-05-18 DIAGNOSIS — Z79899 Other long term (current) drug therapy: Secondary | ICD-10-CM | POA: Diagnosis not present

## 2017-05-18 LAB — BASIC METABOLIC PANEL
ANION GAP: 8 (ref 5–15)
BUN: 12 mg/dL (ref 6–20)
CHLORIDE: 98 mmol/L — AB (ref 101–111)
CO2: 31 mmol/L (ref 22–32)
Calcium: 9.2 mg/dL (ref 8.9–10.3)
Creatinine, Ser: 0.92 mg/dL (ref 0.61–1.24)
GFR calc Af Amer: >60 mL/min (ref >60)
GLUCOSE: 95 mg/dL (ref 65–99)
Potassium: 4.4 mmol/L (ref 3.5–5.1)
Sodium: 137 mmol/L (ref 135–145)

## 2017-05-18 LAB — PSA: Prostatic Specific Antigen: 0.16 ng/mL (ref 0.00–4.00)

## 2017-05-18 NOTE — Progress Notes (Signed)
[place]  Telephone:(336) [phone] Fax:(336) [phone]  ID: James Lucas OB: 1941/03/06  MR#: [id number]  NIO#:[id number]  Patient Care Team: Ezequiel Kayser, MD as PCP - General (Internal Medicine)  CHIEF COMPLAINT: Prostate Cancer  INTERVAL HISTORY: Patient returns to clinic for further evaluation and discussion of his laboratory work.  He continues to have intermittent hot flashes, but states they are tolerable. He currently feels well and is asymptomatic. He has no neurologic complaints. He denies any recent fevers or illnesses. He has a good appetite and denies weight loss. He denies any pain. He has no chest pain or shortness of breath. He denies any nausea, vomiting, constipation, or diarrhea. He has no urinary complaints. Patient offers no further specific complaints today.  REVIEW OF SYSTEMS:   Review of Systems  Constitutional: Negative.  Negative for fever, malaise/fatigue and weight loss.  Respiratory: Negative.  Negative for cough and shortness of breath.   Cardiovascular: Negative.  Negative for chest pain and leg swelling.  Gastrointestinal: Negative.  Negative for abdominal pain.  Genitourinary: Negative.   Musculoskeletal: Negative.   Neurological: Positive for sensory change. Negative for weakness.  Psychiatric/Behavioral: Negative.  The patient is not nervous/anxious.     As per HPI. Otherwise, a complete review of systems is negative.  PAST MEDICAL HISTORY: Past Medical History:  Diagnosis Date  . Allergic rhinitis   . Bladder cancer ([place])   . BPH (benign prostatic hyperplasia)   . Chronic back pain   . Essential hypertension   . Impotence of organic origin   . Prostate cancer ([place])   . Stenosis, spinal, lumbar     PAST SURGICAL HISTORY: No past surgical history on file.  FAMILY HISTORY: Reviewed and unchanged. No reported history of malignancy or chronic disease.     ADVANCED DIRECTIVES (Y/N):  N   HEALTH MAINTENANCE: Social  History  Substance Use Topics  . Smoking status: Former Research scientist (life sciences)  . Smokeless tobacco: Never Used  . Alcohol use No     Colonoscopy:  PAP:  Bone density:  Lipid panel:  Allergies  Allergen Reactions  . Ace Inhibitors Cough    Current Outpatient Prescriptions  Medication Sig Dispense Refill  . aspirin EC 81 MG tablet Take 81 mg by mouth daily.     . benzonatate (TESSALON PERLES) 100 MG capsule Take by mouth 3 (three) times daily as needed for cough.    . bimatoprost (LUMIGAN) 0.01 % SOLN Apply 1 drop to eye at bedtime.     . fluticasone (FLONASE) 50 MCG/ACT nasal spray Place 1 spray into both nostrils daily as needed.     . hydrochlorothiazide (HYDRODIURIL) 25 MG tablet Take 25 mg by mouth daily.     . montelukast (SINGULAIR) 10 MG tablet Take 10 mg by mouth daily.     . VENTOLIN HFA 108 (90 Base) MCG/ACT inhaler     . cyclobenzaprine (FLEXERIL) 10 MG tablet Take 1 tablet (10 mg total) by mouth at bedtime. (Patient not taking: Reported on 05/21/2017) 30 tablet 0  . HYDROcodone-acetaminophen (NORCO/VICODIN) 5-325 MG tablet 1-2 tabs po q 8 hours as needed for severe pain (Patient not taking: Reported on 05/21/2017) 10 tablet 0  . predniSONE (DELTASONE) 20 MG tablet Take 1 tablet (20 mg total) by mouth daily. (Patient not taking: Reported on 05/21/2017) 10 tablet 0   No current facility-administered medications for this visit.     OBJECTIVE: Vitals:   05/21/17 1108  BP: (!) 153/67  Pulse: 80  Resp: 20  Temp: 98.1 F (36.7 C)     Body mass index is 29.52 kg/m.    ECOG FS:0 - Asymptomatic  General: Well-developed, well-nourished, no acute distress. Eyes: Pink conjunctiva, anicteric sclera. Lungs: Clear to auscultation bilaterally. Heart: Regular rate and rhythm. No rubs, murmurs, or gallops. Abdomen: Soft, nontender, nondistended. No organomegaly noted, normoactive bowel sounds. Musculoskeletal: No edema, cyanosis, or clubbing. Neuro: Alert, answering all questions appropriately.  Cranial nerves grossly intact. Skin: No rashes or petechiae noted. Psych: Normal affect.   LAB RESULTS:  Lab Results  Component Value Date   NA 137 05/18/2017   K 4.4 05/18/2017   CL 98 (L) 05/18/2017   CO2 31 05/18/2017   GLUCOSE 95 05/18/2017   BUN 12 05/18/2017   CREATININE 0.92 05/18/2017   CALCIUM 9.2 05/18/2017   GFRNONAA >60 05/18/2017   GFRAA >60 05/18/2017    No results found for: WBC, NEUTROABS, HGB, HCT, MCV, PLT    STUDIES: No results found.  ASSESSMENT: Prostate Cancer  PLAN:    1. Prostate Cancer: Patient initially received cryotherapy followed by adjuvant XRT for his Gleason's 9 adenocarcinoma in approximately 2010. In 2016 he was noted to have increasing PSA and was initiated on Lupron. This was discontinued secondary to hot flashes and significant side effects. He had a nuclear medicine bone scan as well as CT of the abdomen and pelvis in June 2017 at [facility] that did not report any distinct pathology. See recommendation from Dr. Eliane Decree below. Patient's testosterone continues to be less than 3. His PSA remains decreased at 0.16. He does not require additional Lupron at this time. Return to clinic in 3 months with repeat laboratory work and then in 6 months with repeat laboratory work and further evaluation. If PSA begins to rise, can either re-initiate Lupron or consider switching treatment to either Xtandi or Zytiga.  2. Hot flashes: Likely secondary to decreased testosterone. Monitor. 3. Hypertension: Continue evaluation and treatment per PCP.   Patient expressed understanding and was in agreement with this plan. He also understands that He can call clinic at any time with any questions, concerns, or complaints.   Approximately 20 minutes was spent in discussion of which greater than 50% was consultation.  Patient was seen and evaluated by Dr. Beryle Lathe at [facility] on October 26, 2016. He has recommended to discontinue Lupron since his PSA is less than 4.0 and  pursue intermittent therapy when PSA increases to about 10. He also recommend checking testosterone and PSA every 2 months. Finally, consider PET/CT with F-18-fluciclovine to assess for oligometastatic disease if PSA begins to increase.   Lloyd Huger, MD   05/21/2017 2:55 PM

## 2017-05-19 LAB — TESTOSTERONE: Testosterone: <3 ng/dL — ABNORMAL LOW (ref 264–916)

## 2017-05-21 ENCOUNTER — Inpatient Hospital Stay: Payer: Medicare Other

## 2017-05-21 ENCOUNTER — Inpatient Hospital Stay (HOSPITAL_BASED_OUTPATIENT_CLINIC_OR_DEPARTMENT_OTHER): Payer: Medicare Other | Admitting: Oncology

## 2017-05-21 VITALS — BP 153/67 | HR 80 | Temp 98.1°F | Resp 20 | Wt 223.8 lb

## 2017-05-21 DIAGNOSIS — C61 Malignant neoplasm of prostate: Secondary | ICD-10-CM

## 2017-05-21 DIAGNOSIS — R232 Flushing: Secondary | ICD-10-CM

## 2017-05-21 DIAGNOSIS — G8929 Other chronic pain: Secondary | ICD-10-CM

## 2017-05-21 DIAGNOSIS — I10 Essential (primary) hypertension: Secondary | ICD-10-CM | POA: Diagnosis not present

## 2017-05-21 DIAGNOSIS — Z79899 Other long term (current) drug therapy: Secondary | ICD-10-CM

## 2017-05-21 DIAGNOSIS — Z8551 Personal history of malignant neoplasm of bladder: Secondary | ICD-10-CM

## 2017-05-21 DIAGNOSIS — Z7982 Long term (current) use of aspirin: Secondary | ICD-10-CM | POA: Diagnosis not present

## 2017-05-21 DIAGNOSIS — M48061 Spinal stenosis, lumbar region without neurogenic claudication: Secondary | ICD-10-CM | POA: Diagnosis not present

## 2017-05-21 DIAGNOSIS — N400 Enlarged prostate without lower urinary tract symptoms: Secondary | ICD-10-CM

## 2017-05-21 DIAGNOSIS — Z87891 Personal history of nicotine dependence: Secondary | ICD-10-CM

## 2017-05-21 NOTE — Progress Notes (Signed)
Patient denies any concerns today.  

## 2017-08-20 ENCOUNTER — Other Ambulatory Visit: Payer: Self-pay

## 2017-08-20 ENCOUNTER — Inpatient Hospital Stay: Payer: Medicare Other | Attending: Oncology

## 2017-08-20 DIAGNOSIS — C61 Malignant neoplasm of prostate: Secondary | ICD-10-CM

## 2017-08-20 LAB — PSA: PROSTATIC SPECIFIC ANTIGEN: 0.74 ng/mL (ref 0.00–4.00)

## 2017-08-21 LAB — TESTOSTERONE: Testosterone: 96 ng/dL — ABNORMAL LOW (ref 264–916)

## 2017-11-17 ENCOUNTER — Inpatient Hospital Stay: Payer: Medicare Other | Attending: Oncology

## 2017-11-17 DIAGNOSIS — Z7982 Long term (current) use of aspirin: Secondary | ICD-10-CM | POA: Insufficient documentation

## 2017-11-17 DIAGNOSIS — G8929 Other chronic pain: Secondary | ICD-10-CM | POA: Insufficient documentation

## 2017-11-17 DIAGNOSIS — Z79899 Other long term (current) drug therapy: Secondary | ICD-10-CM | POA: Insufficient documentation

## 2017-11-17 DIAGNOSIS — Z9223 Personal history of estrogen therapy: Secondary | ICD-10-CM | POA: Insufficient documentation

## 2017-11-17 DIAGNOSIS — I10 Essential (primary) hypertension: Secondary | ICD-10-CM | POA: Insufficient documentation

## 2017-11-17 DIAGNOSIS — N400 Enlarged prostate without lower urinary tract symptoms: Secondary | ICD-10-CM | POA: Diagnosis not present

## 2017-11-17 DIAGNOSIS — C61 Malignant neoplasm of prostate: Secondary | ICD-10-CM | POA: Diagnosis present

## 2017-11-17 DIAGNOSIS — Z87891 Personal history of nicotine dependence: Secondary | ICD-10-CM | POA: Insufficient documentation

## 2017-11-17 DIAGNOSIS — M549 Dorsalgia, unspecified: Secondary | ICD-10-CM | POA: Diagnosis not present

## 2017-11-17 DIAGNOSIS — Z8551 Personal history of malignant neoplasm of bladder: Secondary | ICD-10-CM | POA: Insufficient documentation

## 2017-11-17 DIAGNOSIS — Z7952 Long term (current) use of systemic steroids: Secondary | ICD-10-CM | POA: Insufficient documentation

## 2017-11-17 NOTE — Progress Notes (Signed)
[place]  Telephone:(336) [phone] Fax:(336) [phone]  ID: Labib Cwynar OB: [birth year]  MR#: [id number]  AOZ#:[id number]  Patient Care Team: Ezequiel Kayser, MD as PCP - General (Internal Medicine)  CHIEF COMPLAINT: Prostate Cancer  INTERVAL HISTORY: Patient returns to clinic for further evaluation and discussion of his laboratory work. He currently feels well and is asymptomatic. He has no neurologic complaints.  He denies any bony pain. He denies any recent fevers or illnesses. He has a good appetite and denies weight loss. He has no chest pain or shortness of breath. He denies any nausea, vomiting, constipation, or diarrhea. He has no urinary complaints. Patient offers no specific complaints today.  REVIEW OF SYSTEMS:   Review of Systems  Constitutional: Negative.  Negative for fever, malaise/fatigue and weight loss.  Respiratory: Negative.  Negative for cough and shortness of breath.   Cardiovascular: Negative.  Negative for chest pain and leg swelling.  Gastrointestinal: Negative.  Negative for abdominal pain.  Genitourinary: Negative.   Musculoskeletal: Negative.  Negative for back pain and joint pain.  Skin: Negative.  Negative for rash.  Neurological: Negative.  Negative for sensory change and weakness.  Psychiatric/Behavioral: Negative.  The patient is not nervous/anxious.     As per HPI. Otherwise, a complete review of systems is negative.  PAST MEDICAL HISTORY: Past Medical History:  Diagnosis Date  . Allergic rhinitis   . Bladder cancer ([place])   . BPH (benign prostatic hyperplasia)   . Chronic back pain   . Essential hypertension   . Impotence of organic origin   . Prostate cancer ([place])   . Stenosis, spinal, lumbar     PAST SURGICAL HISTORY: No past surgical history on file.  FAMILY HISTORY: Reviewed and unchanged. No reported history of malignancy or chronic disease.     ADVANCED DIRECTIVES (Y/N):  N   HEALTH MAINTENANCE: Social  History   Tobacco Use  . Smoking status: Former Research scientist (life sciences)  . Smokeless tobacco: Never Used  Substance Use Topics  . Alcohol use: No  . Drug use: No     Colonoscopy:  PAP:  Bone density:  Lipid panel:  Allergies  Allergen Reactions  . Ace Inhibitors Cough    Current Outpatient Medications  Medication Sig Dispense Refill  . aspirin EC 81 MG tablet Take 81 mg by mouth daily.     . benzonatate (TESSALON PERLES) 100 MG capsule Take by mouth 3 (three) times daily as needed for cough.    . bimatoprost (LUMIGAN) 0.01 % SOLN Apply 1 drop to eye at bedtime.     . fluticasone (FLONASE) 50 MCG/ACT nasal spray Place 1 spray into both nostrils daily as needed.     . hydrochlorothiazide (HYDRODIURIL) 25 MG tablet Take 25 mg by mouth daily.     . montelukast (SINGULAIR) 10 MG tablet Take 10 mg by mouth daily.     . VENTOLIN HFA 108 (90 Base) MCG/ACT inhaler     . cyclobenzaprine (FLEXERIL) 10 MG tablet Take 1 tablet (10 mg total) by mouth at bedtime. (Patient not taking: Reported on 05/21/2017) 30 tablet 0  . HYDROcodone-acetaminophen (NORCO/VICODIN) 5-325 MG tablet 1-2 tabs po q 8 hours as needed for severe pain (Patient not taking: Reported on 05/21/2017) 10 tablet 0  . predniSONE (DELTASONE) 20 MG tablet Take 1 tablet (20 mg total) by mouth daily. (Patient not taking: Reported on 05/21/2017) 10 tablet 0   No current facility-administered medications for this visit.     OBJECTIVE: Vitals:  11/19/17 1042  BP: (!) 156/75  Pulse: 90  Resp: 20  Temp: 98.7 F (37.1 C)     Body mass index is 29.09 kg/m.    ECOG FS:0 - Asymptomatic  General: Well-developed, well-nourished, no acute distress. Eyes: Pink conjunctiva, anicteric sclera. Lungs: Clear to auscultation bilaterally. Heart: Regular rate and rhythm. No rubs, murmurs, or gallops. Abdomen: Soft, nontender, nondistended. No organomegaly noted, normoactive bowel sounds. Musculoskeletal: No edema, cyanosis, or clubbing. Neuro: Alert,  answering all questions appropriately. Cranial nerves grossly intact. Skin: No rashes or petechiae noted. Psych: Normal affect.   LAB RESULTS:  Lab Results  Component Value Date   NA 137 05/18/2017   K 4.4 05/18/2017   CL 98 (L) 05/18/2017   CO2 31 05/18/2017   GLUCOSE 95 05/18/2017   BUN 12 05/18/2017   CREATININE 0.92 05/18/2017   CALCIUM 9.2 05/18/2017   GFRNONAA >60 05/18/2017   GFRAA >60 05/18/2017    No results found for: WBC, NEUTROABS, HGB, HCT, MCV, PLT    STUDIES: No results found.  ASSESSMENT: Prostate Cancer  PLAN:    1. Prostate Cancer: Patient initially received cryotherapy followed by adjuvant XRT for his Gleason's 9 adenocarcinoma in approximately 2010. In 2016 he was noted to have increasing PSA and was initiated on Lupron. This was discontinued secondary to hot flashes and significant side effects. He had a nuclear medicine bone scan as well as CT of the abdomen and pelvis in June 2017 at [facility] that did not report any distinct pathology. See recommendation from Dr. Eliane Decree below.  Patient's testosterone has increased significantly to 155 and his PSA is now 2.32.  After lengthy discussion with the patient, plan is to pursue PET scan to assess for any obvious disease.  If PET scan is negative, patient wishes to defer treatment until his PSA is greater than 4.0 and will return to clinic in 6 months with repeat laboratory work and further evaluation.  If PET scan is positive, patient will return to clinic 1-2 days after his scan reinitiation of treatment.   2. Hot flashes: Patient does not complain of this today.  Likely secondary to decreased testosterone. Monitor. 3. Hypertension: Continue evaluation and treatment per PCP.   Patient expressed understanding and was in agreement with this plan. He also understands that He can call clinic at any time with any questions, concerns, or complaints.   Approximately 30 minutes was spent in discussion of which greater than 50%  was consultation.  Patient was seen and evaluated by Dr. Beryle Lathe at [facility] on October 26, 2016. He has recommended to discontinue Lupron since his PSA is less than 4.0 and pursue intermittent therapy when PSA increases to about 10. He also recommend checking testosterone and PSA every 2 months. Finally, consider PET/CT with F-18-fluciclovine to assess for oligometastatic disease if PSA begins to increase.   Lloyd Huger, MD   11/19/2017 12:57 PM

## 2017-11-18 LAB — TESTOSTERONE: TESTOSTERONE: 155 ng/dL — AB (ref 264–916)

## 2017-11-18 LAB — PSA: Prostatic Specific Antigen: 2.32 ng/mL (ref 0.00–4.00)

## 2017-11-19 ENCOUNTER — Inpatient Hospital Stay (HOSPITAL_BASED_OUTPATIENT_CLINIC_OR_DEPARTMENT_OTHER): Payer: Medicare Other | Admitting: Oncology

## 2017-11-19 ENCOUNTER — Other Ambulatory Visit: Payer: Self-pay

## 2017-11-19 VITALS — BP 156/75 | HR 90 | Temp 98.7°F | Resp 20 | Wt 220.5 lb

## 2017-11-19 DIAGNOSIS — Z9223 Personal history of estrogen therapy: Secondary | ICD-10-CM | POA: Diagnosis not present

## 2017-11-19 DIAGNOSIS — Z8551 Personal history of malignant neoplasm of bladder: Secondary | ICD-10-CM | POA: Diagnosis not present

## 2017-11-19 DIAGNOSIS — Z7982 Long term (current) use of aspirin: Secondary | ICD-10-CM | POA: Diagnosis not present

## 2017-11-19 DIAGNOSIS — M549 Dorsalgia, unspecified: Secondary | ICD-10-CM | POA: Diagnosis not present

## 2017-11-19 DIAGNOSIS — Z79899 Other long term (current) drug therapy: Secondary | ICD-10-CM | POA: Diagnosis not present

## 2017-11-19 DIAGNOSIS — N400 Enlarged prostate without lower urinary tract symptoms: Secondary | ICD-10-CM

## 2017-11-19 DIAGNOSIS — C61 Malignant neoplasm of prostate: Secondary | ICD-10-CM

## 2017-11-19 DIAGNOSIS — Z7952 Long term (current) use of systemic steroids: Secondary | ICD-10-CM | POA: Diagnosis not present

## 2017-11-19 DIAGNOSIS — Z87891 Personal history of nicotine dependence: Secondary | ICD-10-CM | POA: Diagnosis not present

## 2017-11-19 DIAGNOSIS — I10 Essential (primary) hypertension: Secondary | ICD-10-CM | POA: Diagnosis not present

## 2017-11-19 DIAGNOSIS — G8929 Other chronic pain: Secondary | ICD-10-CM

## 2017-11-19 NOTE — Progress Notes (Signed)
Patient denies any concerns today.  

## 2017-12-02 ENCOUNTER — Encounter
Admission: RE | Admit: 2017-12-02 | Discharge: 2017-12-02 | Disposition: A | Discharge status: Home / Self Care | Payer: Medicare Other | Source: Ambulatory Visit | Attending: Oncology | Admitting: Oncology

## 2017-12-02 DIAGNOSIS — C61 Malignant neoplasm of prostate: Secondary | ICD-10-CM | POA: Diagnosis not present

## 2017-12-02 MED ORDER — AXUMIN (FLUCICLOVINE F 18) INJECTION
10.4200 | Freq: Once | INTRAVENOUS | Status: AC
  Administered 2017-12-02: 10.42 via INTRAVENOUS

## 2018-02-28 NOTE — Progress Notes (Signed)
[place]  Telephone:(336) [phone] Fax:(336) [phone]  ID: James Lucas OB: 03-Aug-1941  MR#: [id number]  PIR#:[id number]  Patient Care Team: Ezequiel Kayser, MD as PCP - General (Internal Medicine)  CHIEF COMPLAINT: Prostate Cancer  INTERVAL HISTORY: Patient returns to clinic today for repeat laboratory work and further evaluation.  He continues to feel well and remains asymptomatic.  He denies any pain. He has no neurologic complaints. He denies any recent fevers or illnesses. He has a good appetite and denies weight loss. He has no chest pain or shortness of breath. He denies any nausea, vomiting, constipation, or diarrhea. He has no urinary complaints. Patient feels at his baseline and offers no specific complaints today.    REVIEW OF SYSTEMS:   Review of Systems  Constitutional: Negative.  Negative for fever, malaise/fatigue and weight loss.  Respiratory: Negative.  Negative for cough and shortness of breath.   Cardiovascular: Negative.  Negative for chest pain and leg swelling.  Gastrointestinal: Negative.  Negative for abdominal pain.  Genitourinary: Negative.   Musculoskeletal: Negative.  Negative for back pain and joint pain.  Skin: Negative.  Negative for rash.  Neurological: Negative.  Negative for sensory change, focal weakness and weakness.  Psychiatric/Behavioral: Negative.  The patient is not nervous/anxious.     As per HPI. Otherwise, a complete review of systems is negative.  PAST MEDICAL HISTORY: Past Medical History:  Diagnosis Date  . Allergic rhinitis   . Bladder cancer ([place])   . BPH (benign prostatic hyperplasia)   . Chronic back pain   . Essential hypertension   . Impotence of organic origin   . Prostate cancer ([place])   . Stenosis, spinal, lumbar     PAST SURGICAL HISTORY: History reviewed. No pertinent surgical history.  FAMILY HISTORY: Reviewed and unchanged. No reported history of malignancy or chronic disease.     ADVANCED  DIRECTIVES (Y/N):  N   HEALTH MAINTENANCE: Social History   Tobacco Use  . Smoking status: Former Research scientist (life sciences)  . Smokeless tobacco: Never Used  Substance Use Topics  . Alcohol use: No  . Drug use: No     Colonoscopy:  PAP:  Bone density:  Lipid panel:  Allergies  Allergen Reactions  . Ace Inhibitors Cough    Current Outpatient Medications  Medication Sig Dispense Refill  . aspirin EC 81 MG tablet Take 81 mg by mouth daily.     . bimatoprost (LUMIGAN) 0.01 % SOLN Apply 1 drop to eye at bedtime.     . fluticasone (FLONASE) 50 MCG/ACT nasal spray Place 1 spray into both nostrils daily as needed.     . hydrochlorothiazide (HYDRODIURIL) 25 MG tablet Take 25 mg by mouth daily.     . montelukast (SINGULAIR) 10 MG tablet Take 10 mg by mouth daily.      No current facility-administered medications for this visit.     OBJECTIVE: Vitals:   03/04/18 0959  BP: 128/68  Pulse: 68  Resp: 20  Temp: 98.7 F (37.1 C)     Body mass index is 27.84 kg/m.    ECOG FS:0 - Asymptomatic  General: Well-developed, well-nourished, no acute distress. Eyes: Pink conjunctiva, anicteric sclera. Lungs: Clear to auscultation bilaterally. Heart: Regular rate and rhythm. No rubs, murmurs, or gallops. Abdomen: Soft, nontender, nondistended. No organomegaly noted, normoactive bowel sounds. Musculoskeletal: No edema, cyanosis, or clubbing. Neuro: Alert, answering all questions appropriately. Cranial nerves grossly intact. Skin: No rashes or petechiae noted. Psych: Normal affect.   LAB RESULTS:  Lab  Results  Component Value Date   NA 137 05/18/2017   K 4.4 05/18/2017   CL 98 (L) 05/18/2017   CO2 31 05/18/2017   GLUCOSE 95 05/18/2017   BUN 12 05/18/2017   CREATININE 0.92 05/18/2017   CALCIUM 9.2 05/18/2017   GFRNONAA >60 05/18/2017   GFRAA >60 05/18/2017    No results found for: WBC, NEUTROABS, HGB, HCT, MCV, PLT    STUDIES: No results found.  ASSESSMENT: Prostate Cancer  PLAN:     1. Prostate Cancer: Patient initially received cryotherapy followed by adjuvant XRT for his Gleason's 9 adenocarcinoma in approximately 2010. In 2016 he was noted to have increasing PSA and was initiated on Lupron. This was discontinued secondary to hot flashes and significant side effects. Axumin PET scan completed on December 02, 2017 did not reveal any evidence of metastatic disease.  See recommendation from Dr. Eliane Decree below.  Patient's testosterone and PSA continued to trend up, today's results are pending.  He expressed interest in not pursuing treatment until it is absolutely necessary.  Return to clinic in 3 months for laboratory work only and then in 6 months for laboratory work and further evaluation.   2. Hot flashes: Essentially resolved.  Likely improved secondary to his slowly increasing testosterone levels. 3. Hypertension: Patient blood pressure was within normal limits today.  Patient expressed understanding and was in agreement with this plan. He also understands that He can call clinic at any time with any questions, concerns, or complaints.     Patient was seen and evaluated by Dr. Beryle Lathe at [facility] on October 26, 2016. He has recommended to discontinue Lupron since his PSA is less than 4.0 and pursue intermittent therapy when PSA increases to about 10. He also recommend checking testosterone and PSA every 2 months. Finally, consider PET/CT with F-18-fluciclovine to assess for oligometastatic disease if PSA begins to increase.   Lloyd Huger, MD   03/04/2018 1:35 PM

## 2018-03-01 ENCOUNTER — Other Ambulatory Visit: Payer: Self-pay | Admitting: Oncology

## 2018-03-01 ENCOUNTER — Other Ambulatory Visit: Payer: Self-pay | Admitting: *Deleted

## 2018-03-01 DIAGNOSIS — C61 Malignant neoplasm of prostate: Secondary | ICD-10-CM

## 2018-03-04 ENCOUNTER — Inpatient Hospital Stay: Payer: Medicare Other

## 2018-03-04 ENCOUNTER — Inpatient Hospital Stay: Payer: Medicare Other | Attending: Oncology | Admitting: Oncology

## 2018-03-04 ENCOUNTER — Other Ambulatory Visit: Payer: Self-pay

## 2018-03-04 ENCOUNTER — Encounter: Payer: Self-pay | Admitting: Oncology

## 2018-03-04 VITALS — BP 128/68 | HR 68 | Temp 98.7°F | Resp 20 | Ht 73.0 in | Wt 211.0 lb

## 2018-03-04 DIAGNOSIS — Z79899 Other long term (current) drug therapy: Secondary | ICD-10-CM

## 2018-03-04 DIAGNOSIS — Z87891 Personal history of nicotine dependence: Secondary | ICD-10-CM

## 2018-03-04 DIAGNOSIS — I10 Essential (primary) hypertension: Secondary | ICD-10-CM | POA: Diagnosis not present

## 2018-03-04 DIAGNOSIS — C61 Malignant neoplasm of prostate: Secondary | ICD-10-CM

## 2018-03-04 DIAGNOSIS — M48061 Spinal stenosis, lumbar region without neurogenic claudication: Secondary | ICD-10-CM

## 2018-03-04 DIAGNOSIS — Z923 Personal history of irradiation: Secondary | ICD-10-CM | POA: Diagnosis not present

## 2018-03-04 DIAGNOSIS — Z8551 Personal history of malignant neoplasm of bladder: Secondary | ICD-10-CM | POA: Diagnosis not present

## 2018-03-04 DIAGNOSIS — Z8546 Personal history of malignant neoplasm of prostate: Secondary | ICD-10-CM

## 2018-03-04 DIAGNOSIS — N400 Enlarged prostate without lower urinary tract symptoms: Secondary | ICD-10-CM | POA: Diagnosis not present

## 2018-03-04 DIAGNOSIS — R9721 Rising PSA following treatment for malignant neoplasm of prostate: Secondary | ICD-10-CM

## 2018-03-04 DIAGNOSIS — M549 Dorsalgia, unspecified: Secondary | ICD-10-CM

## 2018-03-04 DIAGNOSIS — Z7982 Long term (current) use of aspirin: Secondary | ICD-10-CM | POA: Diagnosis not present

## 2018-03-04 LAB — PSA: Prostatic Specific Antigen: 4.64 ng/mL — ABNORMAL HIGH (ref 0.00–4.00)

## 2018-03-05 LAB — TESTOSTERONE: Testosterone: 231 ng/dL — ABNORMAL LOW (ref 264–916)

## 2018-05-18 ENCOUNTER — Other Ambulatory Visit: Payer: Medicare Other

## 2018-05-20 ENCOUNTER — Ambulatory Visit: Payer: Medicare Other | Admitting: Oncology

## 2018-06-03 ENCOUNTER — Inpatient Hospital Stay: Payer: Medicare Other | Attending: Oncology

## 2018-06-03 DIAGNOSIS — C61 Malignant neoplasm of prostate: Secondary | ICD-10-CM

## 2018-06-03 DIAGNOSIS — Z8546 Personal history of malignant neoplasm of prostate: Secondary | ICD-10-CM | POA: Insufficient documentation

## 2018-06-03 DIAGNOSIS — Z923 Personal history of irradiation: Secondary | ICD-10-CM | POA: Insufficient documentation

## 2018-06-03 LAB — PSA: PROSTATIC SPECIFIC ANTIGEN: 5.31 ng/mL — AB (ref 0.00–4.00)

## 2018-06-04 LAB — TESTOSTERONE: Testosterone: 208 ng/dL — ABNORMAL LOW (ref 264–916)

## 2018-08-28 NOTE — Progress Notes (Signed)
[place]  [phone] [phone]  ID: James Lucas OB: [birth year]  MR#: [id number]  OIZ#:[id number]  Patient Care Team: Ezequiel Kayser, MD as PCP - General (Internal Medicine)  CHIEF COMPLAINT: Prostate Cancer  INTERVAL HISTORY: Patient returns to clinic today for repeat laboratory work, further evaluation, and discussion of reinitiating Lupron.  He continues to feel well and remains asymptomatic. He denies any pain. He has no neurologic complaints. He denies any recent fevers or illnesses. He has a good appetite and denies weight loss. He has no chest pain or shortness of breath. He denies any nausea, vomiting, constipation, or diarrhea. He has no urinary complaints.  Patient feels at his baseline offers no specific complaints today.  REVIEW OF SYSTEMS:   Review of Systems  Constitutional: Negative.  Negative for fever, malaise/fatigue and weight loss.  Respiratory: Negative.  Negative for cough and shortness of breath.   Cardiovascular: Negative.  Negative for chest pain and leg swelling.  Gastrointestinal: Negative.  Negative for abdominal pain.  Genitourinary: Negative.  Negative for dysuria, flank pain and hematuria.  Musculoskeletal: Negative.  Negative for back pain and joint pain.  Skin: Negative.  Negative for rash.  Neurological: Negative.  Negative for sensory change, focal weakness and weakness.  Psychiatric/Behavioral: Negative.  The patient is not nervous/anxious.     As per HPI. Otherwise, a complete review of systems is negative.  PAST MEDICAL HISTORY: Past Medical History:  Diagnosis Date  . Allergic rhinitis   . Bladder cancer ([place])   . BPH (benign prostatic hyperplasia)   . Chronic back pain   . Essential hypertension   . Impotence of organic origin   . Prostate cancer ([place])   . Stenosis, spinal, lumbar     PAST SURGICAL HISTORY: History reviewed. No pertinent surgical history.  FAMILY HISTORY: Reviewed and  unchanged. No reported history of malignancy or chronic disease.     ADVANCED DIRECTIVES (Y/N):  N   HEALTH MAINTENANCE: Social History   Tobacco Use  . Smoking status: Former Research scientist (life sciences)  . Smokeless tobacco: Never Used  Substance Use Topics  . Alcohol use: No  . Drug use: No     Colonoscopy:  PAP:  Bone density:  Lipid panel:  Allergies  Allergen Reactions  . Ace Inhibitors Cough    Current Outpatient Medications  Medication Sig Dispense Refill  . aspirin EC 81 MG tablet Take 81 mg by mouth daily.     . bimatoprost (LUMIGAN) 0.01 % SOLN Apply 1 drop to eye at bedtime.     . fluticasone (FLONASE) 50 MCG/ACT nasal spray Place 1 spray into both nostrils daily as needed.     . hydrochlorothiazide (HYDRODIURIL) 25 MG tablet Take 25 mg by mouth daily.     . montelukast (SINGULAIR) 10 MG tablet Take 10 mg by mouth daily.      No current facility-administered medications for this visit.     OBJECTIVE: Vitals:   09/02/18 1004  BP: 138/71  Pulse: 71  Resp: 18  Temp: 97.6 F (36.4 C)     Body mass index is 28.1 kg/m.    ECOG FS:0 - Asymptomatic  General: Well-developed, well-nourished, no acute distress. Eyes: Pink conjunctiva, anicteric sclera. HEENT: Normocephalic, moist mucous membranes, clear oropharnyx. Lungs: Clear to auscultation bilaterally. Heart: Regular rate and rhythm. No rubs, murmurs, or gallops. Abdomen: Soft, nontender, nondistended. No organomegaly noted, normoactive bowel sounds. Musculoskeletal: No edema, cyanosis, or clubbing. Neuro: Alert, answering all questions appropriately. Cranial nerves grossly intact.  Skin: No rashes or petechiae noted. Psych: Normal affect.  LAB RESULTS:  Lab Results  Component Value Date   NA 140 09/01/2018   K 4.3 09/01/2018   CL 100 09/01/2018   CO2 30 09/01/2018   GLUCOSE 93 09/01/2018   BUN 8 09/01/2018   CREATININE 0.85 09/01/2018   CALCIUM 9.1 09/01/2018   PROT 7.5 09/01/2018   ALBUMIN 4.4 09/01/2018    AST 19 09/01/2018   ALT 12 09/01/2018   ALKPHOS 52 09/01/2018   BILITOT 0.8 09/01/2018   GFRNONAA >60 09/01/2018   GFRAA >60 09/01/2018    Lab Results  Component Value Date   WBC 6.9 09/01/2018   NEUTROABS 4.0 09/01/2018   HGB 13.0 09/01/2018   HCT 40.0 09/01/2018   MCV 95.9 09/01/2018   PLT 193 09/01/2018    STUDIES: No results found.  ASSESSMENT: Prostate Cancer  PLAN:    1. Prostate Cancer: Patient initially received cryotherapy followed by adjuvant XRT for his Gleason's 9 adenocarcinoma in approximately 2010. In 2016 he was noted to have increasing PSA and was initiated on Lupron. This was discontinued secondary to hot flashes and significant side effects. Axumin PET scan completed on December 02, 2017 did not reveal any evidence of metastatic disease.  See recommendation from Dr. Eliane Decree below.  Patient's PSA continues to trend up and is now 5.98.  After discussion with the patient, he has agreed to reinitiate Lupron treatment at this time.  Prior to initiating treatment we will repeat imaging.  Return to clinic in 1 week for Lupron and then in 6 months for further evaluation and continuation of Lupron.  2. Hot flashes: Resolved.  Continue to monitoring now that Lupron is being reinitiated.    I spent a total of 30 minutes face-to-face with the patient of which greater than 50% of the visit was spent in counseling and coordination of care as detailed above.   Patient expressed understanding and was in agreement with this plan. He also understands that He can call clinic at any time with any questions, concerns, or complaints.     Patient was seen and evaluated by Dr. Beryle Lathe at [facility] on October 26, 2016. He has recommended to discontinue Lupron since his PSA is less than 4.0 and pursue intermittent therapy when PSA increases to about 10. He also recommend checking testosterone and PSA every 2 months. Finally, consider PET/CT with F-18-fluciclovine to assess for oligometastatic  disease if PSA begins to increase.   Lloyd Huger, MD   09/03/2018 10:04 AM

## 2018-09-01 ENCOUNTER — Inpatient Hospital Stay: Payer: Medicare Other | Attending: Oncology

## 2018-09-01 DIAGNOSIS — Z923 Personal history of irradiation: Secondary | ICD-10-CM | POA: Diagnosis not present

## 2018-09-01 DIAGNOSIS — I10 Essential (primary) hypertension: Secondary | ICD-10-CM | POA: Diagnosis not present

## 2018-09-01 DIAGNOSIS — Z8551 Personal history of malignant neoplasm of bladder: Secondary | ICD-10-CM | POA: Insufficient documentation

## 2018-09-01 DIAGNOSIS — Z7982 Long term (current) use of aspirin: Secondary | ICD-10-CM | POA: Diagnosis not present

## 2018-09-01 DIAGNOSIS — C61 Malignant neoplasm of prostate: Secondary | ICD-10-CM

## 2018-09-01 DIAGNOSIS — Z87891 Personal history of nicotine dependence: Secondary | ICD-10-CM | POA: Diagnosis not present

## 2018-09-01 DIAGNOSIS — Z79818 Long term (current) use of other agents affecting estrogen receptors and estrogen levels: Secondary | ICD-10-CM | POA: Diagnosis not present

## 2018-09-01 DIAGNOSIS — Z79899 Other long term (current) drug therapy: Secondary | ICD-10-CM | POA: Diagnosis not present

## 2018-09-01 LAB — CBC WITH DIFFERENTIAL/PLATELET
Abs Immature Granulocytes: 0.01 10*3/uL (ref 0.00–0.07)
BASOS PCT: 1 %
Basophils Absolute: 0.1 10*3/uL (ref 0.0–0.1)
EOS PCT: 5 %
Eosinophils Absolute: 0.4 10*3/uL (ref 0.0–0.5)
HCT: 40 % (ref 39.0–52.0)
Hemoglobin: 13 g/dL (ref 13.0–17.0)
Immature Granulocytes: 0 %
Lymphocytes Relative: 28 %
Lymphs Abs: 1.9 10*3/uL (ref 0.7–4.0)
MCH: 31.2 pg (ref 26.0–34.0)
MCHC: 32.5 g/dL (ref 30.0–36.0)
MCV: 95.9 fL (ref 80.0–100.0)
MONO ABS: 0.5 10*3/uL (ref 0.1–1.0)
Monocytes Relative: 8 %
NEUTROS ABS: 4 10*3/uL (ref 1.7–7.7)
Neutrophils Relative %: 58 %
PLATELETS: 193 10*3/uL (ref 150–400)
RBC: 4.17 MIL/uL — AB (ref 4.22–5.81)
RDW: 12.2 % (ref 11.5–15.5)
WBC: 6.9 10*3/uL (ref 4.0–10.5)
nRBC: 0 % (ref 0.0–0.2)

## 2018-09-01 LAB — COMPREHENSIVE METABOLIC PANEL
ALT: 12 U/L (ref 0–44)
ANION GAP: 10 (ref 5–15)
AST: 19 U/L (ref 15–41)
Albumin: 4.4 g/dL (ref 3.5–5.0)
Alkaline Phosphatase: 52 U/L (ref 38–126)
BILIRUBIN TOTAL: 0.8 mg/dL (ref 0.3–1.2)
BUN: 8 mg/dL (ref 8–23)
CO2: 30 mmol/L (ref 22–32)
Calcium: 9.1 mg/dL (ref 8.9–10.3)
Chloride: 100 mmol/L (ref 98–111)
Creatinine, Ser: 0.85 mg/dL (ref 0.61–1.24)
GFR calc Af Amer: >60 mL/min (ref >60)
Glucose, Bld: 93 mg/dL (ref 70–99)
POTASSIUM: 4.3 mmol/L (ref 3.5–5.1)
Sodium: 140 mmol/L (ref 135–145)
TOTAL PROTEIN: 7.5 g/dL (ref 6.5–8.1)

## 2018-09-01 LAB — PSA: Prostatic Specific Antigen: 5.98 ng/mL — ABNORMAL HIGH (ref 0.00–4.00)

## 2018-09-02 ENCOUNTER — Encounter: Payer: Self-pay | Admitting: Oncology

## 2018-09-02 ENCOUNTER — Inpatient Hospital Stay (HOSPITAL_BASED_OUTPATIENT_CLINIC_OR_DEPARTMENT_OTHER): Payer: Medicare Other | Admitting: Oncology

## 2018-09-02 VITALS — BP 138/71 | HR 71 | Temp 97.6°F | Resp 18 | Wt 213.0 lb

## 2018-09-02 DIAGNOSIS — Z7982 Long term (current) use of aspirin: Secondary | ICD-10-CM

## 2018-09-02 DIAGNOSIS — I10 Essential (primary) hypertension: Secondary | ICD-10-CM | POA: Diagnosis not present

## 2018-09-02 DIAGNOSIS — Z79899 Other long term (current) drug therapy: Secondary | ICD-10-CM

## 2018-09-02 DIAGNOSIS — Z923 Personal history of irradiation: Secondary | ICD-10-CM

## 2018-09-02 DIAGNOSIS — Z79818 Long term (current) use of other agents affecting estrogen receptors and estrogen levels: Secondary | ICD-10-CM

## 2018-09-02 DIAGNOSIS — C61 Malignant neoplasm of prostate: Secondary | ICD-10-CM | POA: Diagnosis not present

## 2018-09-02 DIAGNOSIS — Z8551 Personal history of malignant neoplasm of bladder: Secondary | ICD-10-CM

## 2018-09-02 DIAGNOSIS — Z87891 Personal history of nicotine dependence: Secondary | ICD-10-CM

## 2018-09-02 NOTE — Progress Notes (Signed)
Pt in for follow up, reports "various small bumps on lower back and other body parts at times that itch."

## 2018-09-09 ENCOUNTER — Ambulatory Visit
Admission: RE | Admit: 2018-09-09 | Discharge: 2018-09-09 | Disposition: A | Discharge status: Home / Self Care | Payer: Medicare Other | Source: Ambulatory Visit | Attending: Oncology | Admitting: Oncology

## 2018-09-09 DIAGNOSIS — C61 Malignant neoplasm of prostate: Secondary | ICD-10-CM | POA: Insufficient documentation

## 2018-09-09 MED ORDER — AXUMIN (FLUCICLOVINE F 18) INJECTION
10.0000 | Freq: Once | INTRAVENOUS | Status: AC
  Administered 2018-09-09: 11 via INTRAVENOUS

## 2018-09-12 ENCOUNTER — Inpatient Hospital Stay: Payer: Medicare Other

## 2018-09-12 VITALS — BP 146/72 | HR 60 | Temp 96.8°F | Resp 18

## 2018-09-12 DIAGNOSIS — C61 Malignant neoplasm of prostate: Secondary | ICD-10-CM

## 2018-09-12 MED ORDER — LEUPROLIDE ACETATE (6 MONTH) 45 MG IM KIT
45.0000 mg | PACK | Freq: Once | INTRAMUSCULAR | Status: AC
  Administered 2018-09-12: 45 mg via INTRAMUSCULAR
  Filled 2018-09-12: qty 45

## 2018-09-12 NOTE — Patient Instructions (Signed)

## 2018-09-13 DIAGNOSIS — R351 Nocturia: Secondary | ICD-10-CM | POA: Insufficient documentation

## 2018-09-30 ENCOUNTER — Telehealth: Payer: Self-pay | Admitting: *Deleted

## 2018-09-30 NOTE — Telephone Encounter (Signed)
Call returned to James Lucas regarding rectal pain and burning. Per Dr. Grayland Ormond pain most likely not related to lupron. James Lucas accepted appt in [facility] next Monday, 11/18 at 9:45. PET scan also reviewed with James Lucas per his request. James Lucas verbalized understanding and is in agreement with plan.

## 2018-10-03 ENCOUNTER — Encounter: Payer: Self-pay | Admitting: Nurse Practitioner

## 2018-10-03 ENCOUNTER — Inpatient Hospital Stay: Payer: Medicare Other | Attending: Oncology | Admitting: Nurse Practitioner

## 2018-10-03 VITALS — BP 149/73 | HR 68 | Temp 97.3°F | Resp 18 | Wt 213.0 lb

## 2018-10-03 DIAGNOSIS — Z79899 Other long term (current) drug therapy: Secondary | ICD-10-CM | POA: Insufficient documentation

## 2018-10-03 DIAGNOSIS — Z8551 Personal history of malignant neoplasm of bladder: Secondary | ICD-10-CM | POA: Insufficient documentation

## 2018-10-03 DIAGNOSIS — Z923 Personal history of irradiation: Secondary | ICD-10-CM | POA: Insufficient documentation

## 2018-10-03 DIAGNOSIS — Z7982 Long term (current) use of aspirin: Secondary | ICD-10-CM | POA: Diagnosis not present

## 2018-10-03 DIAGNOSIS — R9721 Rising PSA following treatment for malignant neoplasm of prostate: Secondary | ICD-10-CM | POA: Diagnosis not present

## 2018-10-03 DIAGNOSIS — C61 Malignant neoplasm of prostate: Secondary | ICD-10-CM

## 2018-10-03 DIAGNOSIS — I10 Essential (primary) hypertension: Secondary | ICD-10-CM

## 2018-10-03 DIAGNOSIS — L290 Pruritus ani: Secondary | ICD-10-CM | POA: Insufficient documentation

## 2018-10-03 DIAGNOSIS — Z79818 Long term (current) use of other agents affecting estrogen receptors and estrogen levels: Secondary | ICD-10-CM | POA: Diagnosis not present

## 2018-10-03 DIAGNOSIS — Z87891 Personal history of nicotine dependence: Secondary | ICD-10-CM | POA: Insufficient documentation

## 2018-10-03 DIAGNOSIS — K649 Unspecified hemorrhoids: Secondary | ICD-10-CM | POA: Insufficient documentation

## 2018-10-03 DIAGNOSIS — M48061 Spinal stenosis, lumbar region without neurogenic claudication: Secondary | ICD-10-CM | POA: Insufficient documentation

## 2018-10-03 DIAGNOSIS — N400 Enlarged prostate without lower urinary tract symptoms: Secondary | ICD-10-CM | POA: Insufficient documentation

## 2018-10-03 DIAGNOSIS — R232 Flushing: Secondary | ICD-10-CM | POA: Insufficient documentation

## 2018-10-03 MED ORDER — HYDROCORTISONE 2.5 % RE CREA: 1.0000 "application " | TOPICAL_CREAM | Freq: Two times a day (BID) | RECTAL | 0 refills | Status: DC

## 2018-10-03 NOTE — Patient Instructions (Addendum)
Hemorrhoids Hemorrhoids are swollen veins in and around the rectum or anus. There are two types of hemorrhoids:  Internal hemorrhoids. These occur in the veins that are just inside the rectum. They may poke through to the outside and become irritated and painful.  External hemorrhoids. These occur in the veins that are outside of the anus and can be felt as a painful swelling or hard lump near the anus.  Most hemorrhoids do not cause serious problems, and they can be managed with home treatments such as diet and lifestyle changes. If home treatments do not help your symptoms, procedures can be done to shrink or remove the hemorrhoids. What are the causes? This condition is caused by increased pressure in the anal area. This pressure may result from various things, including:  Constipation.  Straining to have a bowel movement.  Diarrhea.  Pregnancy.  Obesity.  Sitting for long periods of time.  Heavy lifting or other activity that causes you to strain.  Anal sex.  What are the signs or symptoms? Symptoms of this condition include:  Pain.  Anal itching or irritation.  Rectal bleeding.  Leakage of stool (feces).  Anal swelling.  One or more lumps around the anus.  How is this diagnosed? This condition can often be diagnosed through a visual exam. Other exams or tests may also be done, such as:  Examination of the rectal area with a gloved hand (digital rectal exam).  Examination of the anal canal using a small tube (anoscope).  A blood test, if you have lost a significant amount of blood.  A test to look inside the colon (sigmoidoscopy or colonoscopy).  How is this treated? This condition can usually be treated at home. However, various procedures may be done if dietary changes, lifestyle changes, and other home treatments do not help your symptoms. These procedures can help make the hemorrhoids smaller or remove them completely. Some of these procedures involve  surgery, and others do not. Common procedures include:  Rubber band ligation. Rubber bands are placed at the base of the hemorrhoids to cut off the blood supply to them.  Sclerotherapy. Medicine is injected into the hemorrhoids to shrink them.  Infrared coagulation. A type of light energy is used to get rid of the hemorrhoids.  Hemorrhoidectomy surgery. The hemorrhoids are surgically removed, and the veins that supply them are tied off.  Stapled hemorrhoidopexy surgery. A circular stapling device is used to remove the hemorrhoids and use staples to cut off the blood supply to them.  Follow these instructions at home: Eating and drinking  Eat foods that have a lot of fiber in them, such as whole grains, beans, nuts, fruits, and vegetables. Ask your health care provider about taking products that have added fiber (fiber supplements).  Drink enough fluid to keep your urine clear or pale yellow. Managing pain and swelling  Take warm sitz baths for 20 minutes, 3-4 times a day to ease pain and discomfort.  If directed, apply ice to the affected area. Using ice packs between sitz baths may be helpful. ? Put ice in a plastic bag. ? Place a towel between your skin and the bag. ? Leave the ice on for 20 minutes, 2-3 times a day. General instructions  Take over-the-counter and prescription medicines only as told by your health care provider.  Use medicated creams or suppositories as told.  Exercise regularly.  Go to the bathroom when you have the urge to have a bowel movement. Do not wait.    Avoid straining to have bowel movements.  Keep the anal area dry and clean. Use wet toilet paper or moist towelettes after a bowel movement.  Do not sit on the toilet for long periods of time. This increases blood pooling and pain. Contact a health care provider if:  You have increasing pain and swelling that are not controlled by treatment or medicine.  You have uncontrolled bleeding.  You  have difficulty having a bowel movement, or you are unable to have a bowel movement.  You have pain or inflammation outside the area of the hemorrhoids. This information is not intended to replace advice given to you by your health care provider. Make sure you discuss any questions you have with your health care provider. Document Released: 10/30/2000 Document Revised: 04/01/2016 Document Reviewed: 07/17/2015 Elsevier Interactive Patient Education  2018 [employer]. Hydrocortisone suppositories What is this medicine? HYDROCORTISONE (hye droe KOR ti sone) is a corticosteroid. It is used to decrease swelling, itching, and pain that is caused by minor skin irritations or by hemorrhoids. This medicine may be used for other purposes; ask your health care provider or pharmacist if you have questions. COMMON BRAND NAME(S): Anucort-HC, Anumed-HC, Anusol HC, Encort, GRx HiCort, Hemmorex-HC, Hemorrhoidal-HC, Hemril, Proctocort, Proctosert HC, Proctosol-HC, Rectacort HC, Rectasol-HC What should I tell my health care provider before I take this medicine? They need to know if you have any of these conditions: -an unusual or allergic reaction to hydrocortisone, corticosteroids, other medicines, foods, dyes, or preservatives -pregnant or trying to get pregnant -breast-feeding How should I use this medicine? This medicine is for rectal use only. Do not take by mouth. Wash your hands before and after use. Take off the foil wrapping. Wet the tip of the suppository with cold tap water to make it easier to use. Lie on your side with your lower leg straightened out and your upper leg bent forward toward your stomach. Lift upper buttock to expose the rectal area. Apply gentle pressure to insert the suppository completely into the rectum, pointed end first. Hold buttocks together for a few seconds. Remain lying down for about 15 minutes to avoid having the suppository come out. Do not use more often than directed. Talk  to your pediatrician regarding the use of this medicine in children. Special care may be needed. Overdosage: If you think you have taken too much of this medicine contact a poison control center or emergency room at once. NOTE: This medicine is only for you. Do not share this medicine with others. What if I miss a dose? If you miss a dose, use it as soon as you can. If it is almost time for your next dose, use only that dose. Do not use double or extra doses. What may interact with this medicine? Interactions are not expected. Do not use any other rectal products on the affected area without telling your doctor or health care professional. This list may not describe all possible interactions. Give your health care provider a list of all the medicines, herbs, non-prescription drugs, or dietary supplements you use. Also tell them if you smoke, drink alcohol, or use illegal drugs. Some items may interact with your medicine. What should I watch for while using this medicine? Visit your doctor or health care professional for regular checks on your progress. Tell your doctor or health care professional if your symptoms do not improve after a few days of use. Do not use if there is blood in your stools. If you get any  type of infection while using this medicine, you may need to stop using this medicine until our infections clears up. Ask your doctor or health care professional for advice. What side effects may I notice from receiving this medicine? Side effects that you should report to your doctor or health care professional as soon as possible: -bloody or black, tarry stools -painful, red, pus filled blisters in hair follicles -rectal pain, burning or bleeding after use of medicine Side effects that usually do not require medical attention (report to your doctor or health care professional if they continue or are bothersome): -changes in skin color -dry skin -itching or irritation This list may not  describe all possible side effects. Call your doctor for medical advice about side effects. You may report side effects to FDA at 1-800-FDA-1088. Where should I keep my medicine? Keep out of the reach of children. Store at room temperature between 20 and 25 degrees C (68 and 77 degrees F). Protect from heat and freezing. Throw away any unused medicine after the expiration date. NOTE: This sheet is a summary. It may not cover all possible information. If you have questions about this medicine, talk to your doctor, pharmacist, or health care provider.  2018 Elsevier/Gold Standard (2008-03-16 16:07:24)

## 2018-10-03 NOTE — Progress Notes (Signed)
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 Jacqlyn Larsen.  He had a rising PSA post treatment and repeat prostate biopsy was performed in June 2010 for PSA of 1.4 which showed Gleason score 5+4 adenocarcinoma.  He subsequently underwent IMRT with adjuvant hormonal therapy.  PSA nadir was less than 0.1.   PSA 0.3  03/2011 0.6 06/2012 1.4 03/2013 1.7 12/2013 2.0 04/2014 A prostate MRI was performed on 05/23/2014 which showed areas suspicious for extracapsular extension in the right posterior apex and along the posterior aspect of the anterior urethra.  Hormonal therapy was discussed but due to history of side effects he elected to continue surveillance. PSA 3.71 01/2016 5.48 04/2016  - Bone scan and CT performed with no evidence of metastatic disease, no adenopathy or areas suspicious for extracapsular extension.  He agreed to starting hormonal therapy. Initiated Lupron on 07/17/2016.  Suffered hot flashes but 'tolerable'. PSA on 09/22/2016 was 0.55. Dr. Eliane Decree at [facility] on 10/26/16 recommended proceeding with Lupron in 11/2016 and if PSA low in 02/2017 consider stopping Lupron with regular monitoring of PSA and testosterone every 2 months as part of intermittent hormonal therapy approach.   Patient evaluated by Azucena Fallen at [facility] on 10/26/2016 who recommended discontinuing Lupron since PSA < 4.0 and pursue intermittent therapy when PSA increases to about 10.   Recommended checking testosterone and PSA every 2 months, and PET/CT with F-18 fluciclovine to assess for oligomenorrhea static disease if PSA begins to rise.  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  Surveillance cystoscopy on 12/23/2017 performed by Dr. Alto [facility]) with recommendation to repeat in 12/2018.    No history exists.    Interval history- James Lucas, 77 year old male with above history of prostate cancer and bladder cancer s/p cyrotherapy and EBRT, presents to [facility] for evaluation of rectal itching.  Onset of symptoms was abrupt starting 1 week ago ago with unchanged course since that time.   He describes symptoms as anorectal itching and pain with sitting which he rates as mild but bothersome.  Treatment to date has been none.  Evaluation to date: None. Patient denies family hx of colorectal CA, history of previous STDs, known or suspected STD exposure, maroon colored stools, melena, receptive anal intercourse and weight loss.  ECOG FS:1 - Symptomatic but completely ambulatory  Review of systems- Review of Systems  Constitutional: Negative for chills, fever, malaise/fatigue and weight loss.  HENT: Negative for congestion, ear discharge, ear pain, sinus pain, sore throat and tinnitus.   Eyes: Negative.   Respiratory: Negative.  Negative for cough, sputum production and shortness of breath.   Cardiovascular: Negative for chest pain, palpitations, orthopnea, claudication and leg swelling.  Gastrointestinal: Negative for abdominal pain, blood in stool, constipation, diarrhea, heartburn, nausea and vomiting.  Genitourinary: Negative.   Musculoskeletal: Negative.   Skin: Negative.   Neurological: Negative for dizziness, tingling, weakness and headaches.  Endo/Heme/Allergies: Negative.   Psychiatric/Behavioral: Negative.  Current treatment- Lupron (last 09/12/2018)  Allergies    Allergen Reactions  . Ace Inhibitors Cough    Past Medical History:  Diagnosis Date  . Allergic rhinitis   . Bladder cancer ([place])   . BPH (benign prostatic hyperplasia)   . Chronic back pain   . Essential hypertension   . Impotence of organic origin   . Prostate cancer ([place])   . Stenosis, spinal, lumbar     History reviewed. No pertinent surgical history.  Social History   Socioeconomic History  . Marital status: Married    Spouse name: Not on file  . Number of children: Not on file  . Years of education: Not on file  . Highest education level: Not on file  Occupational History  . Not on file  Social Needs  . Financial resource strain: Not on file  . Food insecurity:    Worry: Not on file    Inability: Not on file  . Transportation needs:    Medical: Not on file    Non-medical: Not on file  Tobacco Use  . Smoking status: Former Research scientist (life sciences)  . Smokeless tobacco: Never Used  Substance and Sexual Activity  . Alcohol use: No  . Drug use: No  . Sexual activity: Not on file  Lifestyle  . Physical activity:    Days per week: Not on file    Minutes per session: Not on file  . Stress: Not on file  Relationships  . Social connections:    Talks on phone: Not on file    Gets together: Not on file    Attends religious service: Not on file    Active member of club or organization: Not on file    Attends meetings of clubs or organizations: Not on file    Relationship status: Not on file  . Intimate partner violence:    Fear of current or ex partner: Not on file    Emotionally abused: Not on file    Physically abused: Not on file    Forced sexual activity: Not on file  Other Topics Concern  . Not on file  Social History Narrative  . Not on file    Family History  Problem Relation Age of Onset  . Cancer Father     Current Outpatient Medications:  .  aspirin EC 81 MG tablet, Take 81 mg by mouth daily. , Disp: , Rfl:  .  bimatoprost (LUMIGAN) 0.01 % SOLN, Apply 1  drop to eye at bedtime. , Disp: , Rfl:  .  fluticasone (FLONASE) 50 MCG/ACT nasal spray, Place 1 spray into both nostrils daily as needed. , Disp: , Rfl:  .  hydrochlorothiazide (HYDRODIURIL) 25 MG tablet, Take 25 mg by mouth daily. , Disp: , Rfl:  .  montelukast (SINGULAIR) 10 MG tablet, Take 10 mg by mouth daily. , Disp: , Rfl:  .  hydrocortisone (ANUSOL-HC) 2.5 % rectal cream, Place 1 application rectally 2 (two) times daily., Disp: 30 g, Rfl: 0  Physical exam:  Vitals:   10/03/18 0947  BP: (!) 149/73  Pulse: 68  Resp: 18  Temp: (!) 97.3 F (36.3 C)  TempSrc: Tympanic  Weight: 213 lb (96.6 kg)   Physical Exam  Constitutional: He is oriented to person, place, and time. He appears well-developed and well-nourished.  unaccompanied  Eyes: Conjunctivae are normal. No scleral icterus.  Neck: Normal range of motion. Neck supple.  Cardiovascular: Normal rate, regular rhythm and normal heart sounds.  Pulmonary/Chest: Effort normal and  breath sounds normal. No respiratory distress.  Abdominal: Soft. Bowel sounds are normal. He exhibits no distension. There is no tenderness.  Genitourinary: Rectal exam shows external hemorrhoid. Rectal exam shows no fissure.  Genitourinary Comments: Exam chaperoned by nursing  Lymphadenopathy:    He has no cervical adenopathy.  Neurological: He is alert and oriented to person, place, and time.  Skin: Skin is warm and dry.  Psychiatric: He has a normal mood and affect. His behavior is normal.     CMP Latest Ref Rng & Units 09/01/2018  Glucose 70 - 99 mg/dL 93  BUN 8 - 23 mg/dL 8  Creatinine 0.61 - 1.24 mg/dL 0.85  Sodium 135 - 145 mmol/L 140  Potassium 3.5 - 5.1 mmol/L 4.3  Chloride 98 - 111 mmol/L 100  CO2 22 - 32 mmol/L 30  Calcium 8.9 - 10.3 mg/dL 9.1  Total Protein 6.5 - 8.1 g/dL 7.5  Total Bilirubin 0.3 - 1.2 mg/dL 0.8  Alkaline Phos 38 - 126 U/L 52  AST 15 - 41 U/L 19  ALT 0 - 44 U/L 12   CBC Latest Ref Rng & Units 09/01/2018  WBC 4.0 -  10.5 K/uL 6.9  Hemoglobin 13.0 - 17.0 g/dL 13.0  Hematocrit 39.0 - 52.0 % 40.0  Platelets 150 - 400 K/uL 193    No images are attached to the encounter.  Nm Pet (axumin) Skull Base To Mid Thigh  Result Date: 09/09/2018 CLINICAL DATA:  Prostate carcinoma with biochemical recurrence. EXAM: NUCLEAR MEDICINE PET SKULL BASE TO THIGH TECHNIQUE: 11.0 mCi F-18 Fluciclovine was injected intravenously. Full-ring PET imaging was performed from the skull base to thigh after the radiotracer. CT data was obtained and used for attenuation correction and anatomic localization. COMPARISON:  Fluciclovine PET-CT scan 12/02/2017 FINDINGS: NECK No radiotracer activity in neck lymph nodes. Incidental CT finding: None CHEST No radiotracer accumulation within mediastinal or hilar lymph nodes. No suspicious pulmonary nodules on the CT scan. Incidental CT finding: None ABDOMEN/PELVIS Prostate: Activity again noted centrally within prostate gland. There is intense activity in the bladder associated urine. Additionally there is intense activity inferior to the prostate. Query whether this activity is related to prostate carcinoma or potentially TURP defect and urine activity in the prostate gland. Favor the latter (ie urine). No significant change from prior. No abnormal radiotracer activity within pelvic or abdominal lymph nodes Lymph nodes: No abnormal radiotracer accumulation within pelvic or abdominal nodes. Liver: No evidence of liver metastasis Incidental CT finding: None SKELETON No focal  activity to suggest skeletal metastasis. IMPRESSION: 1. Indeterminate activity in the prostate bed. 2. No evidence of metastatic lymphadenopathy. 3. No evidence soft tissue metastasis or skeletal metastasis. Electronically Signed   By: Suzy Bouchard M.D.   On: 09/09/2018 15:18    Assessment and plan- Patient is a 77 y.o. male diagnosed with Prostate Cancer who presents to [facility]om Management clinic for anal itching and discomfort.    1. Prostate Cancer- s/p cryotherapy followed by adjuvant XRT for Gleason's 9 adenocarcinoma in 2010.  In 2016 PSA increased and he initiated Lupron which was previously discontinued due to hot flashes. Axumin PET on 12/02/2017 did not reveal any evidence of metastatic disease.  PSA continues to trend up and Lupron was reinitiated  2. External Symptomatic Hemorrhoids- External hemorrhoids not swollen or inflamed appearing. Declined DRE. Elected for conservative measures at this time. Prescription for hydrocortisone cream sent to pharmacy (suppositories not approved by insurance). OTCs and prevention including increased intake of high-fiber diet discussed  at length. Also discussed next steps such as banding, referral to GI, etc which patient declined today.  He states that DRE was previously recommended by his urologist and he will follow-up.   3. Hot Flashes - likely secondary to Lupron. Discussed management options but patient declines intervention at this time. Can reconsider in the future as needed.   4. Screening Colonoscopy- per history, performed in 2014 and patient may be due for screening colonoscopy but prior report not available. Recommend discussing with PCP and scheduling.   Follow up with PCP and Dr. Grayland Ormond as scheduled. Patient advised to notify the clinic if there is no improvement in symptoms or if symptoms worsen for referral to GI for evaluation and colonoscopy.     Visit Diagnosis 1. Prostate cancer ([place])   2. Hemorrhoids, unspecified hemorrhoid type     Patient expressed understanding and was in agreement with this plan. He also understands that He can call clinic at any time with any questions, concerns, or complaints.   Thank you for allowing me to participate in the care of this very pleasant patient.   Beckey Rutter, DNP, AGNP-C [facility] [place] (work cell) [phone] (office)  CC: Dr. Raechel Ache, Dr. Grayland Ormond

## 2018-10-04 ENCOUNTER — Telehealth: Payer: Self-pay | Admitting: *Deleted

## 2018-10-04 NOTE — Telephone Encounter (Signed)
Patient called asking that Alease Medina, NP return his call to discuss the medicine she ordered for him yesterday. [phone]

## 2019-03-02 ENCOUNTER — Inpatient Hospital Stay: Payer: Medicare Other | Attending: Hematology and Oncology

## 2019-03-02 ENCOUNTER — Other Ambulatory Visit: Payer: Self-pay

## 2019-03-02 DIAGNOSIS — C61 Malignant neoplasm of prostate: Secondary | ICD-10-CM | POA: Diagnosis present

## 2019-03-02 DIAGNOSIS — Z923 Personal history of irradiation: Secondary | ICD-10-CM | POA: Diagnosis not present

## 2019-03-02 DIAGNOSIS — Z79818 Long term (current) use of other agents affecting estrogen receptors and estrogen levels: Secondary | ICD-10-CM | POA: Diagnosis not present

## 2019-03-02 LAB — COMPREHENSIVE METABOLIC PANEL
ALT: 13 U/L (ref 0–44)
AST: 19 U/L (ref 15–41)
Albumin: 4.1 g/dL (ref 3.5–5.0)
Alkaline Phosphatase: 51 U/L (ref 38–126)
Anion gap: 6 (ref 5–15)
BUN: 13 mg/dL (ref 8–23)
CO2: 30 mmol/L (ref 22–32)
Calcium: 9 mg/dL (ref 8.9–10.3)
Chloride: 100 mmol/L (ref 98–111)
Creatinine, Ser: 0.87 mg/dL (ref 0.61–1.24)
GFR calc Af Amer: >60 mL/min (ref >60)
GFR calc non Af Amer: >60 mL/min (ref >60)
Glucose, Bld: 139 mg/dL — ABNORMAL HIGH (ref 70–99)
Potassium: 3.7 mmol/L (ref 3.5–5.1)
Sodium: 136 mmol/L (ref 135–145)
Total Bilirubin: 0.6 mg/dL (ref 0.3–1.2)
Total Protein: 6.9 g/dL (ref 6.5–8.1)

## 2019-03-02 LAB — CBC WITH DIFFERENTIAL/PLATELET
Abs Immature Granulocytes: 0.01 10*3/uL (ref 0.00–0.07)
Basophils Absolute: 0.1 10*3/uL (ref 0.0–0.1)
Basophils Relative: 1 %
Eosinophils Absolute: 0.6 10*3/uL — ABNORMAL HIGH (ref 0.0–0.5)
Eosinophils Relative: 10 %
HCT: 37.1 % — ABNORMAL LOW (ref 39.0–52.0)
Hemoglobin: 12.2 g/dL — ABNORMAL LOW (ref 13.0–17.0)
Immature Granulocytes: 0 %
Lymphocytes Relative: 25 %
Lymphs Abs: 1.5 10*3/uL (ref 0.7–4.0)
MCH: 32 pg (ref 26.0–34.0)
MCHC: 32.9 g/dL (ref 30.0–36.0)
MCV: 97.4 fL (ref 80.0–100.0)
Monocytes Absolute: 0.3 10*3/uL (ref 0.1–1.0)
Monocytes Relative: 5 %
Neutro Abs: 3.4 10*3/uL (ref 1.7–7.7)
Neutrophils Relative %: 59 %
Platelets: 200 10*3/uL (ref 150–400)
RBC: 3.81 MIL/uL — ABNORMAL LOW (ref 4.22–5.81)
RDW: 11.9 % (ref 11.5–15.5)
WBC: 5.8 10*3/uL (ref 4.0–10.5)
nRBC: 0 % (ref 0.0–0.2)

## 2019-03-02 LAB — PSA: Prostatic Specific Antigen: 0.53 ng/mL (ref 0.00–4.00)

## 2019-03-03 LAB — TESTOSTERONE: Testosterone: 10 ng/dL — ABNORMAL LOW (ref 264–916)

## 2019-03-15 NOTE — Progress Notes (Signed)
[facility]  [address] [place], [place] [address] Phone: [phone]  Fax: [phone]   Clinic Day:  03/16/2019     Referring physician: Ezequiel Kayser, MD  Chief Complaint: James Lucas is a 78 y.o. male with a long-standing history of prostate cancer who is seen for new patient assessment.  HPI:  The patient was diagnosed with prostate cancer in 2009.  Gleason score was 4+3 in 2009.  He received cryoablation by Dr Jacqlyn Larsen.  He then developed a rising PSA (1.4) post treatment.  Repeat prostate biopsy in 04/2009 revealed Gleason 5+4 adenocarcinoma.  He received IMRT with adjuvant hormonal therapy.  PSA nadir was < 0.1.  Prostate MRI on 05/23/2014 revealed areas suspicious for extracapsular extension in the right posterior apex and along the posterior aspect of the anterior urethra.  Hormonal therapy was discussed but due to history of side effects he elected to continue surveillance.  PSA increased and was started on Lupron in 2016.  Lupron was discontinued secondary to hot flashes and significant side effects. PSA increased to 3.64 in 01/2016.   Abdomen and pelvic CT on 05/01/2016 revealed no evidence of recurrent or metastatic disease. Bone scan on 05/01/2016 showed no evidence of osseous metastatic disease.  Patient was reinitiated on Lupron, a 17-month injection, on 07/10/2016 by Dr. Grayland Ormond at his initial evaluation.   He was evaluated by Dr. Beryle Lathe at [facility] on 10/26/2016.  He recommended discontinuation of Lupron as his PSA was < 4.0. He was to pursue intermittent therapy when PSA increased to about 10.  Testosterone and PSA checks were recommended every 2 months and PET/CT scan with F-18 fluciclovine to assess for oligomenorrhea static disease if PSA began to rise.  Axumin PET scan on 12/02/2017 revealed potential local recurrence in the residual left prostate gland, with the region of activity relatively small at approximately 1.5 cm. No  evidence of metastatic disease in the abdomen pelvis or skeleton.   He was last seen in the medical oncology clinic on 09/02/2018 by Dr. Grayland Ormond.  At that time, PSA was 5.98.  He agreed to reinitiate Lupron (received 09/12/2018).  Axumin PET scan on 09/09/2018 revealed indeterminate activity in the prostate bed and no evidence of metastasis.  PSA has been followed: 0.6 on 07/15/2012, 1.3 on 07/14/2013, 3.64 on 02/10/2016, 0.55 on 09/22/2016, 0.18 on 02/17/2017, 2.32 on 11/17/2017, 4.64 on 03/04/2018, 5.31 on 06/03/2018, 5.98 on 09/01/2018, and 0.53 on 03/02/2019.  He has a history of low-grade urothelial carcinoma of the bladder.  He was diagnosed in 2004 with recurrences in 2005 and 2007.  Surveillance cystoscopy on 12/23/2017 was performed by Dr. Alto Denver [facility]) with recommendation to repeat in 12/2018.   In the interim, the patient is doing well. He reports hot flashes with fatigue, several times daily, following Lupron treatments. He has noticed that hot flashes dissipate when his PSA rises. He had hemorrhoids following his last 81-month Lupron injection and requested future injection to be the lower 18-month dose.  He follows a vegan diet, and has for 5-6 years, and eats green, leafy vegetables occasionally. He denies blood in his stool or urine, except following a cystoscopy on 01/12/2019, for which results were normal.   He is unaware of a family history of prostate cancer. Father died of heart failure.    Past Medical History:  Diagnosis Date  . Allergic rhinitis   . Bladder cancer ([place])   . BPH (benign prostatic hyperplasia)   . Chronic back pain   .  Essential hypertension   . Impotence of organic origin   . Prostate cancer ([place])   . Stenosis, spinal, lumbar     History reviewed. No pertinent surgical history.  Family History  Problem Relation Age of Onset  . Cancer Father     Social History:  reports that he has quit smoking. He has never used smokeless tobacco. He reports  that he does not drink alcohol or use drugs. He is a former smoker and smoked several packs per day in his early 45s but has not smoked in over 1 years. He does not drink alcohol. He is retired but used to work for [employer]. He denies known exposure to radiation or toxins. The patient is alone today.  Allergies:  Allergies  Allergen Reactions  . Ace Inhibitors Cough    Current Medications: Current Outpatient Medications  Medication Sig Dispense Refill  . aspirin EC 81 MG tablet Take 81 mg by mouth daily.     . hydrochlorothiazide (HYDRODIURIL) 25 MG tablet Take 25 mg by mouth daily.     Marland Kitchen latanoprost (XALATAN) 0.005 % ophthalmic solution     . montelukast (SINGULAIR) 10 MG tablet Take 10 mg by mouth daily.     . fluticasone (FLONASE) 50 MCG/ACT nasal spray Place 1 spray into both nostrils daily as needed.      No current facility-administered medications for this visit.     Review of Systems  Constitutional: Positive for diaphoresis (due to hot flashes) and malaise/fatigue. Negative for chills, fever and weight loss.       Doing well.   HENT: Negative.  Negative for congestion, hearing loss, nosebleeds, sinus pain and sore throat.   Eyes: Negative.  Negative for blurred vision, double vision and redness.  Respiratory: Negative.  Negative for cough, shortness of breath and wheezing.   Cardiovascular: Negative.  Negative for chest pain, palpitations, leg swelling and PND.  Gastrointestinal: Positive for blood in stool (resolved). Negative for abdominal pain, constipation, diarrhea, nausea and vomiting.       Vegan diet.  Genitourinary: Negative for dysuria, frequency, hematuria (resolved) and urgency.  Musculoskeletal: Negative.  Negative for back pain, joint pain and myalgias.  Skin: Negative.  Negative for itching and rash.  Neurological: Negative for dizziness, tingling, sensory change, speech change, focal weakness, weakness and headaches.  Endo/Heme/Allergies: Negative.  Does not  bruise/bleed easily.  Psychiatric/Behavioral: Negative.  Negative for memory loss. The patient is not nervous/anxious and does not have insomnia.    Performance status (ECOG): 1  Vitals: Blood pressure 130/62, pulse 72, temperature 97.8 F (36.6 C), temperature source Tympanic, resp. rate 18, height 6' (1.829 m), weight 224 lb 13.9 oz (102 kg).   Physical Exam  Constitutional: He is oriented to person, place, and time. He appears well-developed and well-nourished. No distress.  HENT:  Head: Normocephalic and atraumatic.  Mouth/Throat: Oropharynx is clear and moist.  White hair and beard. Wearing a mask, hat, gloves.  Eyes: Pupils are equal, round, and reactive to light. Conjunctivae and EOM are normal. No scleral icterus.  Glasses.  Brown eyes.  Neck: Normal range of motion. Neck supple. No JVD present.  Cardiovascular: Normal rate, regular rhythm and normal heart sounds. Exam reveals no gallop and no friction rub.  No murmur heard. Pulmonary/Chest: Effort normal and breath sounds normal. No respiratory distress. He has no wheezes. He has no rales.  Abdominal: Soft. Bowel sounds are normal. He exhibits no distension and no mass. There is no abdominal tenderness. There is  no rebound and no guarding.  Musculoskeletal: Normal range of motion.        General: No edema.  Lymphadenopathy:    He has no cervical adenopathy.    He has no axillary adenopathy.       Right: No supraclavicular adenopathy present.       Left: No supraclavicular adenopathy present.  Neurological: He is alert and oriented to person, place, and time. He has normal reflexes.  Skin: Skin is warm. No rash noted. He is diaphoretic. No erythema. No pallor.  Psychiatric: He has a normal mood and affect. His behavior is normal. Judgment and thought content normal.  Nursing note and vitals reviewed.   Imaging studies: 05/23/2014:  Prostate MRI revealed areas suspicious for extracapsular extension in the right posterior  apex and along the posterior aspect of the anterior urethra.  05/01/2016:  Abdomen and pelvis CT revealed no evidence of recurrent or metastatic disease. Bone scan on 05/01/2016 showed no evidence of osseous metastatic disease. 12/02/2017:  Axumin PET scan revealed potential local recurrence in the residual left prostate gland, with the region of activity relatively small at approximately 1.5 cm. No evidence of metastatic disease in the abdomen pelvis or skeleton.  09/09/2018:  Axumin PET scan revealed indeterminate activity in the prostate bed and no evidence of metastasis.   No visits with results within 3 Day(s) from this visit.  Latest known visit with results is:  Appointment on 03/02/2019  Component Date Value Ref Range Status  . Prostatic Specific Antigen 03/02/2019 0.53  0.00 - 4.00 ng/mL Final   Comment: (NOTE) While PSA levels of <=4.0 ng/ml are reported as reference range, some men with levels below 4.0 ng/ml can have prostate cancer and many men with PSA above 4.0 ng/ml do not have prostate cancer.  Other tests such as free PSA, age specific reference ranges, PSA velocity and PSA doubling time may be helpful especially in men less than 27 years old. Performed at [facility], [place] [address], [place], [place] [address]   . Testosterone 03/02/2019 10* 264 - 916 ng/dL Final   Comment: (NOTE) Adult male reference interval is based on a population of healthy nonobese males (BMI <30) between 23 and 60 years old. [place], [place] [phone]. PMID: [id number]. Performed At: [facility] [place], [place] [id number] Rush Farmer MD [id number]   . Sodium 03/02/2019 136  135 - 145 mmol/L Final  . Potassium 03/02/2019 3.7  3.5 - 5.1 mmol/L Final  . Chloride 03/02/2019 100  98 - 111 mmol/L Final  . CO2 03/02/2019 30  22 - 32 mmol/L Final  . Glucose, Bld 03/02/2019 139* 70 - 99 mg/dL Final  . BUN 03/02/2019 13  8 - 23 mg/dL Final  .  Creatinine, Ser 03/02/2019 0.87  0.61 - 1.24 mg/dL Final  . Calcium 03/02/2019 9.0  8.9 - 10.3 mg/dL Final  . Total Protein 03/02/2019 6.9  6.5 - 8.1 g/dL Final  . Albumin 03/02/2019 4.1  3.5 - 5.0 g/dL Final  . AST 03/02/2019 19  15 - 41 U/L Final  . ALT 03/02/2019 13  0 - 44 U/L Final  . Alkaline Phosphatase 03/02/2019 51  38 - 126 U/L Final  . Total Bilirubin 03/02/2019 0.6  0.3 - 1.2 mg/dL Final  . GFR calc non Af Amer 03/02/2019 >60  >60 mL/min Final  . GFR calc Af Amer 03/02/2019 >60  >60 mL/min Final  . Anion gap 03/02/2019 6  5 -  15 Final   Performed at [facility], [address] [place], [place] [address]  . WBC 03/02/2019 5.8  4.0 - 10.5 K/uL Final  . RBC 03/02/2019 3.81* 4.22 - 5.81 MIL/uL Final  . Hemoglobin 03/02/2019 12.2* 13.0 - 17.0 g/dL Final  . HCT 03/02/2019 37.1* 39.0 - 52.0 % Final  . MCV 03/02/2019 97.4  80.0 - 100.0 fL Final  . MCH 03/02/2019 32.0  26.0 - 34.0 pg Final  . MCHC 03/02/2019 32.9  30.0 - 36.0 g/dL Final  . RDW 03/02/2019 11.9  11.5 - 15.5 % Final  . Platelets 03/02/2019 200  150 - 400 K/uL Final  . nRBC 03/02/2019 0.0  0.0 - 0.2 % Final  . Neutrophils Relative % 03/02/2019 59  % Final  . Neutro Abs 03/02/2019 3.4  1.7 - 7.7 K/uL Final  . Lymphocytes Relative 03/02/2019 25  % Final  . Lymphs Abs 03/02/2019 1.5  0.7 - 4.0 K/uL Final  . Monocytes Relative 03/02/2019 5  % Final  . Monocytes Absolute 03/02/2019 0.3  0.1 - 1.0 K/uL Final  . Eosinophils Relative 03/02/2019 10  % Final  . Eosinophils Absolute 03/02/2019 0.6* 0.0 - 0.5 K/uL Final  . Basophils Relative 03/02/2019 1  % Final  . Basophils Absolute 03/02/2019 0.1  0.0 - 0.1 K/uL Final  . Immature Granulocytes 03/02/2019 0  % Final  . Abs Immature Granulocytes 03/02/2019 0.01  0.00 - 0.07 K/uL Final   Performed at [facility], [address] [place] [address] [place], [place] [address]    Assessment:  Jakobe Blau is a 78 y.o. male with prostate cancer diagnosed in 2009.   Gleason score was 4+3 in 2009.  He received cryoablation.   He developed a biochemical recurrence.  Prostate biopsy in 04/2009 revealed Gleason 5+4 adenocarcinoma.  He received IMRT with adjuvant hormonal therapy.  PSA nadir was < 0.1.  He has been on Lupron intermittently secondary to side effects. He received Lupron (45 mg; 6 month) on 09/12/2018.  He wishes to receive only 3 month Lupron injections.  Axumin PET scan on 09/09/2018 revealed indeterminate activity in the prostate bed and no evidence of metastasis.  PSA has been followed: 0.6 on 07/15/2012, 1.3 on 07/14/2013, 3.64 on 02/10/2016, 0.55 on 09/22/2016, 0.18 on 02/17/2017, 2.32 on 11/17/2017, 4.64 on 03/04/2018, 5.31 on 06/03/2018, 5.98 on 09/01/2018, and 0.53 on 03/02/2019.  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  He undergoes surveillance cystoscopy by Dr. Alto Denver [facility]).   Last cystoscopy was normal on 01/12/2019.  He has a normocytic anemia.  He follows a vegan diet.  He denies any melena or hematochezia.  Symptomatically, he is doing well.  He reports hot flashes with fatigue, several times daily, following Lupron treatments.   Plan: 1.   Reviewed labs from 03/02/2019. 2.   Review entire medical history, diagnosis and management of prostate cancer. 3.   Prostate cancer  Patient initially diagnosed in 2009.  He has received cryotherapy and IMRT.  With biochemical recurrence, he has received Lupron intermittently.  He is due for Lupron today but declines.  Discuss prior plan for intermittent Lupron when PSA > 10.  Request for Lupron 64-month injections when needed secondary to side effects 4.   Normocytic anemia  Patient denies any bleeding except for mild hematuria post cystoscopy (resolved).  Labs today: ferritin, iron studies, B12, folate, retic 5.   RTC every 2  months x 2 for labs (CBC with diff, LFTs, PSA, testosterone) 6.   RTC in 6 months  for MD assessment, labs (CBC with diff, CMP, PSA, testosterone), and +/- Lupron.  I discussed the assessment and treatment plan with the patient.  The patient was provided an opportunity to ask questions and all were answered.  The patient agreed with the plan and demonstrated an understanding of the instructions.  The patient was advised to call back if the symptoms worsen or if the condition fails to improve as anticipated.  I provided 30 minutes of face-to-face time during this this encounter and > 50% was spent counseling as documented under my assessment and plan.   Nolon Stalls, MD, PhD    03/16/2019, 2:02 PM  I, Molly Dorshimer, am acting as Education administrator for [employer]. Mike Gip, MD, PhD.  I, Melissa C. Mike Gip, MD, have reviewed the above documentation for accuracy and completeness, and I agree with the above.

## 2019-03-16 ENCOUNTER — Other Ambulatory Visit: Payer: Self-pay

## 2019-03-16 ENCOUNTER — Encounter: Payer: Self-pay | Admitting: Hematology and Oncology

## 2019-03-16 ENCOUNTER — Inpatient Hospital Stay: Payer: Medicare Other

## 2019-03-16 ENCOUNTER — Inpatient Hospital Stay (HOSPITAL_BASED_OUTPATIENT_CLINIC_OR_DEPARTMENT_OTHER): Payer: Medicare Other | Admitting: Hematology and Oncology

## 2019-03-16 ENCOUNTER — Ambulatory Visit: Payer: Medicare Other

## 2019-03-16 VITALS — BP 130/62 | HR 72 | Temp 97.8°F | Resp 18 | Ht 72.0 in | Wt 224.9 lb

## 2019-03-16 DIAGNOSIS — C61 Malignant neoplasm of prostate: Secondary | ICD-10-CM

## 2019-03-16 DIAGNOSIS — D649 Anemia, unspecified: Secondary | ICD-10-CM

## 2019-03-16 DIAGNOSIS — Z79818 Long term (current) use of other agents affecting estrogen receptors and estrogen levels: Secondary | ICD-10-CM

## 2019-03-16 DIAGNOSIS — Z923 Personal history of irradiation: Secondary | ICD-10-CM | POA: Diagnosis not present

## 2019-03-16 LAB — IRON AND TIBC
Iron: 74 ug/dL (ref 45–182)
Saturation Ratios: 22 % (ref 17.9–39.5)
TIBC: 335 ug/dL (ref 250–450)
UIBC: 261 ug/dL

## 2019-03-16 LAB — RETICULOCYTES
Immature Retic Fract: 7.8 % (ref 2.3–15.9)
RBC.: 3.89 MIL/uL — ABNORMAL LOW (ref 4.22–5.81)
Retic Count, Absolute: 38.9 10*3/uL (ref 19.0–186.0)
Retic Ct Pct: 1 % (ref 0.4–3.1)

## 2019-03-16 LAB — FOLATE: Folate: 20.9 ng/mL (ref >5.9)

## 2019-03-16 LAB — FERRITIN: Ferritin: 43 ng/mL (ref 24–336)

## 2019-03-16 LAB — VITAMIN B12: Vitamin B-12: 96 pg/mL — ABNORMAL LOW (ref 180–914)

## 2019-03-16 NOTE — Progress Notes (Signed)
No new changes noted today 

## 2019-03-17 ENCOUNTER — Ambulatory Visit: Payer: Medicare Other | Admitting: Oncology

## 2019-03-17 ENCOUNTER — Ambulatory Visit: Payer: Medicare Other | Admitting: Hematology and Oncology

## 2019-03-17 ENCOUNTER — Ambulatory Visit: Payer: Medicare Other

## 2019-03-20 ENCOUNTER — Ambulatory Visit: Admit: 2019-03-20 | Payer: Medicare Other | Admitting: Gastroenterology

## 2019-03-20 SURGERY — COLONOSCOPY WITH PROPOFOL
Anesthesia: General

## 2019-03-21 ENCOUNTER — Ambulatory Visit: Payer: Medicare Other

## 2019-03-21 ENCOUNTER — Ambulatory Visit: Payer: Medicare Other | Admitting: Hematology and Oncology

## 2019-03-21 ENCOUNTER — Telehealth: Payer: Self-pay

## 2019-03-21 NOTE — Telephone Encounter (Signed)
-----   Message from Lequita Asal, MD sent at 03/21/2019  1:04 PM EDT ----- Regarding: Did he receive a call?  B12 was low.    He can start on B12 weekly x 6 then monthly or begin oral B12 1000 mcg a day with recheck of B12 in 1 month.  M ----- Message ----- From: Buel Ream, [facility] [place] Sent: 03/16/2019   7:09 PM EDT To: Lequita Asal, MD

## 2019-03-21 NOTE — Telephone Encounter (Signed)
Unable to leave a message  / no answer

## 2019-05-11 ENCOUNTER — Other Ambulatory Visit: Payer: Self-pay

## 2019-05-11 ENCOUNTER — Inpatient Hospital Stay: Payer: Medicare Other | Attending: Hematology and Oncology

## 2019-05-11 DIAGNOSIS — Z923 Personal history of irradiation: Secondary | ICD-10-CM | POA: Diagnosis not present

## 2019-05-11 DIAGNOSIS — Z79818 Long term (current) use of other agents affecting estrogen receptors and estrogen levels: Secondary | ICD-10-CM | POA: Diagnosis not present

## 2019-05-11 DIAGNOSIS — C61 Malignant neoplasm of prostate: Secondary | ICD-10-CM | POA: Diagnosis not present

## 2019-05-11 LAB — CBC WITH DIFFERENTIAL/PLATELET
Abs Immature Granulocytes: 0.02 10*3/uL (ref 0.00–0.07)
Basophils Absolute: 0.1 10*3/uL (ref 0.0–0.1)
Basophils Relative: 1 %
Eosinophils Absolute: 0.3 10*3/uL (ref 0.0–0.5)
Eosinophils Relative: 4 %
HCT: 37.9 % — ABNORMAL LOW (ref 39.0–52.0)
Hemoglobin: 12.5 g/dL — ABNORMAL LOW (ref 13.0–17.0)
Immature Granulocytes: 0 %
Lymphocytes Relative: 30 %
Lymphs Abs: 2.1 10*3/uL (ref 0.7–4.0)
MCH: 31.6 pg (ref 26.0–34.0)
MCHC: 33 g/dL (ref 30.0–36.0)
MCV: 95.9 fL (ref 80.0–100.0)
Monocytes Absolute: 0.5 10*3/uL (ref 0.1–1.0)
Monocytes Relative: 8 %
Neutro Abs: 3.8 10*3/uL (ref 1.7–7.7)
Neutrophils Relative %: 57 %
Platelets: 195 10*3/uL (ref 150–400)
RBC: 3.95 MIL/uL — ABNORMAL LOW (ref 4.22–5.81)
RDW: 12.2 % (ref 11.5–15.5)
WBC: 6.8 10*3/uL (ref 4.0–10.5)
nRBC: 0 % (ref 0.0–0.2)

## 2019-05-11 LAB — HEPATIC FUNCTION PANEL
ALT: 11 U/L (ref 0–44)
AST: 19 U/L (ref 15–41)
Albumin: 4.2 g/dL (ref 3.5–5.0)
Alkaline Phosphatase: 54 U/L (ref 38–126)
Bilirubin, Direct: <0.1 mg/dL (ref 0.0–0.2)
Total Bilirubin: 0.5 mg/dL (ref 0.3–1.2)
Total Protein: 6.9 g/dL (ref 6.5–8.1)

## 2019-05-11 LAB — PSA: Prostatic Specific Antigen: 0.34 ng/mL (ref 0.00–4.00)

## 2019-05-12 LAB — TESTOSTERONE: Testosterone: <3 ng/dL — ABNORMAL LOW (ref 264–916)

## 2019-07-06 ENCOUNTER — Other Ambulatory Visit: Payer: Self-pay

## 2019-07-06 ENCOUNTER — Inpatient Hospital Stay: Payer: Medicare Other | Attending: Hematology and Oncology

## 2019-07-06 DIAGNOSIS — Z79818 Long term (current) use of other agents affecting estrogen receptors and estrogen levels: Secondary | ICD-10-CM | POA: Insufficient documentation

## 2019-07-06 DIAGNOSIS — C61 Malignant neoplasm of prostate: Secondary | ICD-10-CM | POA: Insufficient documentation

## 2019-07-06 DIAGNOSIS — Z923 Personal history of irradiation: Secondary | ICD-10-CM | POA: Insufficient documentation

## 2019-07-06 LAB — CBC WITH DIFFERENTIAL/PLATELET
Abs Immature Granulocytes: 0.02 10*3/uL (ref 0.00–0.07)
Basophils Absolute: 0.1 10*3/uL (ref 0.0–0.1)
Basophils Relative: 1 %
Eosinophils Absolute: 0.7 10*3/uL — ABNORMAL HIGH (ref 0.0–0.5)
Eosinophils Relative: 8 %
HCT: 37 % — ABNORMAL LOW (ref 39.0–52.0)
Hemoglobin: 12.4 g/dL — ABNORMAL LOW (ref 13.0–17.0)
Immature Granulocytes: 0 %
Lymphocytes Relative: 26 %
Lymphs Abs: 2.3 10*3/uL (ref 0.7–4.0)
MCH: 31.6 pg (ref 26.0–34.0)
MCHC: 33.5 g/dL (ref 30.0–36.0)
MCV: 94.4 fL (ref 80.0–100.0)
Monocytes Absolute: 0.7 10*3/uL (ref 0.1–1.0)
Monocytes Relative: 7 %
Neutro Abs: 5.1 10*3/uL (ref 1.7–7.7)
Neutrophils Relative %: 58 %
Platelets: 199 10*3/uL (ref 150–400)
RBC: 3.92 MIL/uL — ABNORMAL LOW (ref 4.22–5.81)
RDW: 12.6 % (ref 11.5–15.5)
WBC: 8.9 10*3/uL (ref 4.0–10.5)
nRBC: 0 % (ref 0.0–0.2)

## 2019-07-06 LAB — HEPATIC FUNCTION PANEL
ALT: 14 U/L (ref 0–44)
AST: 20 U/L (ref 15–41)
Albumin: 4.3 g/dL (ref 3.5–5.0)
Alkaline Phosphatase: 52 U/L (ref 38–126)
Bilirubin, Direct: <0.1 mg/dL (ref 0.0–0.2)
Total Bilirubin: 0.8 mg/dL (ref 0.3–1.2)
Total Protein: 7.3 g/dL (ref 6.5–8.1)

## 2019-07-06 LAB — PSA: Prostatic Specific Antigen: 0.56 ng/mL (ref 0.00–4.00)

## 2019-07-07 LAB — TESTOSTERONE: Testosterone: 34 ng/dL — ABNORMAL LOW (ref 264–916)

## 2019-08-16 ENCOUNTER — Other Ambulatory Visit: Payer: Self-pay

## 2019-08-16 DIAGNOSIS — Z20822 Contact with and (suspected) exposure to covid-19: Secondary | ICD-10-CM

## 2019-08-17 LAB — NOVEL CORONAVIRUS, NAA: SARS-CoV-2, NAA: NOT DETECTED

## 2019-08-21 ENCOUNTER — Other Ambulatory Visit
Admission: RE | Admit: 2019-08-21 | Discharge: 2019-08-21 | Disposition: A | Discharge status: Home / Self Care | Payer: Medicare Other | Source: Ambulatory Visit | Attending: Gastroenterology | Admitting: Gastroenterology

## 2019-08-21 DIAGNOSIS — Z01818 Encounter for other preprocedural examination: Secondary | ICD-10-CM | POA: Diagnosis present

## 2019-08-21 DIAGNOSIS — Z20828 Contact with and (suspected) exposure to other viral communicable diseases: Secondary | ICD-10-CM | POA: Diagnosis not present

## 2019-08-21 LAB — SARS CORONAVIRUS 2 (TAT 6-24 HRS): SARS Coronavirus 2: NEGATIVE

## 2019-08-23 ENCOUNTER — Encounter: Payer: Self-pay | Admitting: *Deleted

## 2019-08-24 ENCOUNTER — Encounter
Admission: RE | Disposition: A | Discharge status: Home / Self Care | Payer: Self-pay | Source: Home / Self Care | Attending: Gastroenterology

## 2019-08-24 ENCOUNTER — Ambulatory Visit: Payer: Medicare Other | Admitting: Certified Registered"

## 2019-08-24 ENCOUNTER — Other Ambulatory Visit: Payer: Self-pay

## 2019-08-24 ENCOUNTER — Encounter: Payer: Self-pay | Admitting: *Deleted

## 2019-08-24 ENCOUNTER — Ambulatory Visit
Admission: RE | Admit: 2019-08-24 | Discharge: 2019-08-24 | Disposition: A | Discharge status: Home / Self Care | Payer: Medicare Other | Attending: Gastroenterology | Admitting: Gastroenterology

## 2019-08-24 DIAGNOSIS — Z8551 Personal history of malignant neoplasm of bladder: Secondary | ICD-10-CM | POA: Diagnosis not present

## 2019-08-24 DIAGNOSIS — K5730 Diverticulosis of large intestine without perforation or abscess without bleeding: Secondary | ICD-10-CM | POA: Insufficient documentation

## 2019-08-24 DIAGNOSIS — Z7982 Long term (current) use of aspirin: Secondary | ICD-10-CM | POA: Insufficient documentation

## 2019-08-24 DIAGNOSIS — Z79899 Other long term (current) drug therapy: Secondary | ICD-10-CM | POA: Insufficient documentation

## 2019-08-24 DIAGNOSIS — Z905 Acquired absence of kidney: Secondary | ICD-10-CM | POA: Diagnosis not present

## 2019-08-24 DIAGNOSIS — D122 Benign neoplasm of ascending colon: Secondary | ICD-10-CM | POA: Insufficient documentation

## 2019-08-24 DIAGNOSIS — Z1211 Encounter for screening for malignant neoplasm of colon: Secondary | ICD-10-CM | POA: Insufficient documentation

## 2019-08-24 DIAGNOSIS — H409 Unspecified glaucoma: Secondary | ICD-10-CM | POA: Diagnosis not present

## 2019-08-24 DIAGNOSIS — Z8546 Personal history of malignant neoplasm of prostate: Secondary | ICD-10-CM | POA: Insufficient documentation

## 2019-08-24 DIAGNOSIS — I10 Essential (primary) hypertension: Secondary | ICD-10-CM | POA: Insufficient documentation

## 2019-08-24 DIAGNOSIS — Z86010 Personal history of colonic polyps: Secondary | ICD-10-CM | POA: Insufficient documentation

## 2019-08-24 DIAGNOSIS — K640 First degree hemorrhoids: Secondary | ICD-10-CM | POA: Diagnosis not present

## 2019-08-24 DIAGNOSIS — Z87891 Personal history of nicotine dependence: Secondary | ICD-10-CM | POA: Insufficient documentation

## 2019-08-24 DIAGNOSIS — D123 Benign neoplasm of transverse colon: Secondary | ICD-10-CM | POA: Diagnosis not present

## 2019-08-24 HISTORY — PX: COLONOSCOPY WITH PROPOFOL: SHX5780

## 2019-08-24 HISTORY — DX: Laceration of liver, unspecified degree, initial encounter: S36.113A

## 2019-08-24 HISTORY — DX: Unspecified glaucoma: H40.9

## 2019-08-24 SURGERY — COLONOSCOPY WITH PROPOFOL
Anesthesia: General

## 2019-08-24 MED ORDER — SODIUM CHLORIDE 0.9 % IV SOLN
INTRAVENOUS | Status: DC
  Administered 2019-08-24: 10:00:00 1000 mL via INTRAVENOUS

## 2019-08-24 MED ORDER — PROPOFOL 500 MG/50ML IV EMUL
INTRAVENOUS | Status: AC
  Filled 2019-08-24: qty 50

## 2019-08-24 MED ORDER — PROPOFOL 10 MG/ML IV BOLUS
INTRAVENOUS | Status: DC | PRN
  Administered 2019-08-24: 50 mg via INTRAVENOUS

## 2019-08-24 MED ORDER — PROPOFOL 500 MG/50ML IV EMUL
INTRAVENOUS | Status: DC | PRN
  Administered 2019-08-24: 150 ug/kg/min via INTRAVENOUS

## 2019-08-24 NOTE — Anesthesia Preprocedure Evaluation (Signed)
Anesthesia Evaluation  Patient identified by MRN, date of birth, ID band Patient awake    Reviewed: Allergy & Precautions, H&P , NPO status , Patient's Chart, lab work & pertinent test results, reviewed documented beta blocker date and time   Airway Mallampati: II  TM Distance: >3 FB Neck ROM: full    Dental  (+) Teeth Intact   Pulmonary neg pulmonary ROS, former smoker,    Pulmonary exam normal        Cardiovascular Exercise Tolerance: Poor hypertension, On Medications negative cardio ROS Normal cardiovascular exam Rhythm:regular Rate:Normal     Neuro/Psych negative neurological ROS  negative psych ROS   GI/Hepatic negative GI ROS, Neg liver ROS,   Endo/Other  negative endocrine ROS  Renal/GU negative Renal ROS  negative genitourinary   Musculoskeletal   Abdominal   Peds  Hematology negative hematology ROS (+)   Anesthesia Other Findings Past Medical History: No date: Allergic rhinitis No date: Bladder cancer ([facility]) No date: BPH (benign prostatic hyperplasia) No date: Chronic back pain No date: Essential hypertension No date: Glaucoma No date: Impotence of organic origin No date: Liver laceration No date: Prostate cancer ([place]) No date: Stenosis, spinal, lumbar Past Surgical History: No date: BACK SURGERY No date: COLONOSCOPY No date: HERNIA REPAIR 1978: NEPHRECTOMY; Right No date: TRANSURETHRAL RESECTION OF BLADDER TUMOR WITH GYRUS (TURBT- GYRUS)   Reproductive/Obstetrics negative OB ROS                             Anesthesia Physical Anesthesia Plan  ASA: II  Anesthesia Plan: General   Post-op Pain Management:    Induction:   PONV Risk Score and Plan:   Airway Management Planned:   Additional Equipment:   Intra-op Plan:   Post-operative Plan:   Informed Consent: I have reviewed the patients History and Physical, chart, labs and discussed the procedure  including the risks, benefits and alternatives for the proposed anesthesia with the patient or authorized representative who has indicated his/her understanding and acceptance.     Dental Advisory Given  Plan Discussed with: CRNA  Anesthesia Plan Comments:         Anesthesia Quick Evaluation

## 2019-08-24 NOTE — Anesthesia Post-op Follow-up Note (Signed)
Anesthesia QCDR form completed.        

## 2019-08-24 NOTE — Anesthesia Postprocedure Evaluation (Signed)
Anesthesia Post Note  Patient: Cogan Vanzant  Procedure(s) Performed: COLONOSCOPY WITH PROPOFOL (N/A )  Patient location during evaluation: PACU Anesthesia Type: General Level of consciousness: awake and alert Pain management: pain level controlled Vital Signs Assessment: post-procedure vital signs reviewed and stable Respiratory status: spontaneous breathing, nonlabored ventilation, respiratory function stable and patient connected to nasal cannula oxygen Cardiovascular status: blood pressure returned to baseline and stable Postop Assessment: no apparent nausea or vomiting Anesthetic complications: no     Last Vitals:  Vitals:   08/24/19 0922 08/24/19 1041  BP: (!) 160/81 113/70  Pulse: 67   Resp: 17   Temp: (!) 36.4 C 36.4 C  SpO2: 100%     Last Pain:  Vitals:   08/24/19 1041  TempSrc: Tympanic  PainSc:                  Molli Barrows

## 2019-08-24 NOTE — Op Note (Signed)
[facility] Gastroenterology Patient Name: James Lucas Procedure Date: 08/24/2019 10:00 AM MRN: [id number] Account #: [id number] Date of Birth: 1940-12-11 Admit Type: Outpatient Age: 78 Room: [facility] ENDO ROOM 1 Gender: Male Note Status: Finalized Procedure:            Colonoscopy Indications:          Personal history of colonic polyps Providers:            Lollie Sails, MD Referring MD:         Christena Flake. Raechel Ache, MD (Referring MD) Medicines:            Monitored Anesthesia Care Complications:        No immediate complications. Procedure:            Pre-Anesthesia Assessment:                       - ASA Grade Assessment: III - A patient with severe                        systemic disease.                       After obtaining informed consent, the colonoscope was                        passed under direct vision. Throughout the procedure,                        the patient's blood pressure, pulse, and oxygen                        saturations were monitored continuously. The                        Colonoscope was introduced through the anus and                        advanced to the the cecum, identified by appendiceal                        orifice and ileocecal valve. The colonoscopy was                        performed with moderate difficulty due to poor bowel                        prep. Successful completion of the procedure was aided                        by lavage. The patient tolerated the procedure well.                        The quality of the bowel preparation was fair. Findings:      Multiple medium-mouthed diverticula were found in the sigmoid colon,       descending colon, transverse colon, ascending colon and cecum.      A 8 mm polyp was found in the transverse colon. The polyp was sessile.       The polyp was removed with a cold snare. Resection and retrieval were       complete.  A 4 mm polyp was found in the proximal transverse  colon. The polyp was       sessile. The polyp was removed with a cold snare. Resection and       retrieval were complete.      Two sessile polyps were found in the ascending colon. The polyps were 4       to 5 mm in size. These polyps were removed with a cold snare. Resection       and retrieval were complete.      Non-bleeding internal hemorrhoids were found during retroflexion and       during anoscopy. The hemorrhoids were small and Grade I (internal       hemorrhoids that do not prolapse).      The exam was otherwise normal throughout the examined colon.      The digital rectal exam was normal. Impression:           - Preparation of the colon was fair.                       - Diverticulosis in the sigmoid colon, in the                        descending colon, in the transverse colon, in the                        ascending colon and in the cecum.                       - One 8 mm polyp in the transverse colon, removed with                        a cold snare. Resected and retrieved.                       - One 4 mm polyp in the proximal transverse colon,                        removed with a cold snare. Resected and retrieved.                       - Two 4 to 5 mm polyps in the ascending colon, removed                        with a cold snare. Resected and retrieved.                       - Non-bleeding internal hemorrhoids. Recommendation:       - Discharge patient to home.                       - Await pathology results.                       - Telephone GI clinic for pathology results in 5 days. Procedure Code(s):    --- Professional ---                       [phone], Colonoscopy, flexible; with removal of tumor(s),  polyp(s), or other lesion(s) by snare technique Diagnosis Code(s):    --- Professional ---                       K64.0, First degree hemorrhoids                       K63.5, Polyp of colon                       Z86.010, Personal history of colonic  polyps                       K57.30, Diverticulosis of large intestine without                        perforation or abscess without bleeding CPT copyright 2019 American Medical Association. All rights reserved. The codes documented in this report are preliminary and upon coder review may  be revised to meet current compliance requirements. Lollie Sails, MD 08/24/2019 10:38:44 AM This report has been signed electronically. Number of Addenda: 0 Note Initiated On: 08/24/2019 10:00 AM Scope Withdrawal Time: 0 hours 4 minutes 21 seconds  Total Procedure Duration: 0 hours 24 minutes 35 seconds       [facility]

## 2019-08-24 NOTE — H&P (Signed)
Outpatient short stay form Pre-procedure 08/24/2019 10:00 AM Lollie Sails MD  Primary Physician: Genene Churn, MD  Reason for visit: Colonoscopy  History of present illness: Patient is a 78 year old male presenting today for colonoscopy in regards to his personal history of adenomatous colon polyps.  Last colonoscopy was 09/27/2013.  Tolerated his prep well.  He takes no aspirin or blood thinning agent with the exception of an 81 mg aspirin.    Current Facility-Administered Medications:  .  0.9 %  sodium chloride infusion, , Intravenous, Continuous, Lollie Sails, MD, Last Rate: 20 mL/hr at 08/24/19 0942, 1,000 mL at 08/24/19 0942  Medications Prior to Admission  Medication Sig Dispense Refill Last Dose  . acetaminophen (TYLENOL) 500 MG tablet Take 500 mg by mouth every 6 (six) hours.   Past Month at Unknown time  . albuterol (VENTOLIN HFA) 108 (90 Base) MCG/ACT inhaler Inhale 2 puffs into the lungs every 4 (four) hours as needed for wheezing or shortness of breath.     Marland Kitchen aspirin EC 81 MG tablet Take 81 mg by mouth daily.    Past Week at 0800  . bimatoprost (LUMIGAN) 0.01 % SOLN Place 1 drop into both eyes at bedtime.   08/23/2019 at 2000  . fluticasone (FLONASE) 50 MCG/ACT nasal spray Place 1 spray into both nostrils daily as needed.    Past Week at Unknown time  . hydrochlorothiazide (HYDRODIURIL) 25 MG tablet Take 25 mg by mouth daily.    08/24/2019 at 0700  . latanoprost (XALATAN) 0.005 % ophthalmic solution    08/23/2019 at 2000  . montelukast (SINGULAIR) 10 MG tablet Take 10 mg by mouth daily.    08/24/2019 at 0700     Allergies  Allergen Reactions  . Ace Inhibitors Cough     Past Medical History:  Diagnosis Date  . Allergic rhinitis   . Bladder cancer ([place])   . BPH (benign prostatic hyperplasia)   . Chronic back pain   . Essential hypertension   . Glaucoma   . Impotence of organic origin   . Liver laceration   . Prostate cancer ([place])   . Stenosis, spinal,  lumbar     Review of systems:      Physical Exam    Heart and lungs: Regular rate and rhythm without rub or gallop lungs are bilaterally clear    HEENT: Normocephalic atraumatic eyes are anicteric    Other:    Pertinant exam for procedure: Soft nontender nondistended bowel sounds positive normoactive    Planned proceedures: Colonoscopy and indicated procedures. I have discussed the risks benefits and complications of procedures to include not limited to bleeding, infection, perforation and the risk of sedation and the patient wishes to proceed.     Lollie Sails, MD Gastroenterology 08/24/2019  10:00 AM

## 2019-08-24 NOTE — Transfer of Care (Signed)
Immediate Anesthesia Transfer of Care Note  Patient: James Lucas  Procedure(s) Performed: COLONOSCOPY WITH PROPOFOL (N/A )  Patient Location: Endoscopy Unit  Anesthesia Type:General  Level of Consciousness: awake, alert  and oriented  Airway & Oxygen Therapy: Patient Spontanous Breathing and Patient connected to nasal cannula oxygen  Post-op Assessment: Report given to RN and Post -op Vital signs reviewed and stable  Post vital signs: Reviewed and stable  Last Vitals:  Vitals Value Taken Time  BP    Temp    Pulse    Resp    SpO2      Last Pain:  Vitals:   08/24/19 0922  TempSrc: Tympanic  PainSc: 0-No pain         Complications: No apparent anesthesia complications

## 2019-08-25 ENCOUNTER — Encounter: Payer: Self-pay | Admitting: Gastroenterology

## 2019-08-25 LAB — SURGICAL PATHOLOGY

## 2019-08-31 ENCOUNTER — Other Ambulatory Visit: Payer: Self-pay

## 2019-08-31 ENCOUNTER — Inpatient Hospital Stay: Payer: Medicare Other

## 2019-08-31 ENCOUNTER — Inpatient Hospital Stay: Payer: Medicare Other | Attending: Hematology and Oncology | Admitting: Nurse Practitioner

## 2019-08-31 VITALS — BP 157/71 | HR 79 | Temp 97.5°F | Resp 18 | Wt 229.8 lb

## 2019-08-31 DIAGNOSIS — C61 Malignant neoplasm of prostate: Secondary | ICD-10-CM | POA: Diagnosis present

## 2019-08-31 DIAGNOSIS — Z79899 Other long term (current) drug therapy: Secondary | ICD-10-CM | POA: Diagnosis not present

## 2019-08-31 DIAGNOSIS — D649 Anemia, unspecified: Secondary | ICD-10-CM | POA: Diagnosis not present

## 2019-08-31 DIAGNOSIS — Z905 Acquired absence of kidney: Secondary | ICD-10-CM | POA: Insufficient documentation

## 2019-08-31 DIAGNOSIS — Z87891 Personal history of nicotine dependence: Secondary | ICD-10-CM | POA: Diagnosis not present

## 2019-08-31 DIAGNOSIS — Z8719 Personal history of other diseases of the digestive system: Secondary | ICD-10-CM | POA: Insufficient documentation

## 2019-08-31 DIAGNOSIS — Z923 Personal history of irradiation: Secondary | ICD-10-CM | POA: Insufficient documentation

## 2019-08-31 DIAGNOSIS — Z7982 Long term (current) use of aspirin: Secondary | ICD-10-CM | POA: Insufficient documentation

## 2019-08-31 DIAGNOSIS — Z8551 Personal history of malignant neoplasm of bladder: Secondary | ICD-10-CM | POA: Diagnosis not present

## 2019-08-31 DIAGNOSIS — I10 Essential (primary) hypertension: Secondary | ICD-10-CM | POA: Diagnosis not present

## 2019-08-31 LAB — COMPREHENSIVE METABOLIC PANEL
ALT: 13 U/L (ref 0–44)
AST: 19 U/L (ref 15–41)
Albumin: 4.2 g/dL (ref 3.5–5.0)
Alkaline Phosphatase: 56 U/L (ref 38–126)
Anion gap: 7 (ref 5–15)
BUN: 8 mg/dL (ref 8–23)
CO2: 28 mmol/L (ref 22–32)
Calcium: 8.8 mg/dL — ABNORMAL LOW (ref 8.9–10.3)
Chloride: 100 mmol/L (ref 98–111)
Creatinine, Ser: 0.84 mg/dL (ref 0.61–1.24)
GFR calc Af Amer: >60 mL/min (ref >60)
GFR calc non Af Amer: >60 mL/min (ref >60)
Glucose, Bld: 98 mg/dL (ref 70–99)
Potassium: 4 mmol/L (ref 3.5–5.1)
Sodium: 135 mmol/L (ref 135–145)
Total Bilirubin: 0.5 mg/dL (ref 0.3–1.2)
Total Protein: 7.1 g/dL (ref 6.5–8.1)

## 2019-08-31 LAB — CBC WITH DIFFERENTIAL/PLATELET
Abs Immature Granulocytes: 0.03 10*3/uL (ref 0.00–0.07)
Basophils Absolute: 0.1 10*3/uL (ref 0.0–0.1)
Basophils Relative: 1 %
Eosinophils Absolute: 0.8 10*3/uL — ABNORMAL HIGH (ref 0.0–0.5)
Eosinophils Relative: 10 %
HCT: 37 % — ABNORMAL LOW (ref 39.0–52.0)
Hemoglobin: 12.3 g/dL — ABNORMAL LOW (ref 13.0–17.0)
Immature Granulocytes: 0 %
Lymphocytes Relative: 28 %
Lymphs Abs: 2.3 10*3/uL (ref 0.7–4.0)
MCH: 31.7 pg (ref 26.0–34.0)
MCHC: 33.2 g/dL (ref 30.0–36.0)
MCV: 95.4 fL (ref 80.0–100.0)
Monocytes Absolute: 0.7 10*3/uL (ref 0.1–1.0)
Monocytes Relative: 8 %
Neutro Abs: 4.4 10*3/uL (ref 1.7–7.7)
Neutrophils Relative %: 53 %
Platelets: 194 10*3/uL (ref 150–400)
RBC: 3.88 MIL/uL — ABNORMAL LOW (ref 4.22–5.81)
RDW: 12.2 % (ref 11.5–15.5)
WBC: 8.3 10*3/uL (ref 4.0–10.5)
nRBC: 0 % (ref 0.0–0.2)

## 2019-08-31 LAB — HEPATIC FUNCTION PANEL
ALT: 12 U/L (ref 0–44)
AST: 19 U/L (ref 15–41)
Albumin: 4.2 g/dL (ref 3.5–5.0)
Alkaline Phosphatase: 55 U/L (ref 38–126)
Bilirubin, Direct: <0.1 mg/dL (ref 0.0–0.2)
Total Bilirubin: 0.5 mg/dL (ref 0.3–1.2)
Total Protein: 7.2 g/dL (ref 6.5–8.1)

## 2019-08-31 LAB — PSA: Prostatic Specific Antigen: 1.33 ng/mL (ref 0.00–4.00)

## 2019-08-31 NOTE — Progress Notes (Signed)
Patient here for follow up. Denies any concerns at this time.  

## 2019-08-31 NOTE — Progress Notes (Signed)
[facility]  [address], [address] [place], [place] [address] Phone: [phone]  Fax: [phone]   Clinic Day:  08/31/2019     Referring physician: Ezequiel Kayser, MD  Chief Complaint: James Lucas is a 78 y.o. male with a long-standing history of prostate cancer who is seen for new patient assessment.  HPI:  The patient was diagnosed with prostate cancer in 2009.  Gleason score was 4+3 in 2009.  He received cryoablation by Dr Jacqlyn Larsen.  He then developed a rising PSA (1.4) post treatment.  Repeat prostate biopsy in 04/2009 revealed Gleason 5+4 adenocarcinoma.  He received IMRT with adjuvant hormonal therapy.  PSA nadir was < 0.1.  Prostate MRI on 05/23/2014 revealed areas suspicious for extracapsular extension in the right posterior apex and along the posterior aspect of the anterior urethra.  Hormonal therapy was discussed but due to history of side effects he elected to continue surveillance.  PSA increased and was started on Lupron in 2016.  Lupron was discontinued secondary to hot flashes and significant side effects. PSA increased to 3.64 in 01/2016.   Abdomen and pelvic CT on 05/01/2016 revealed no evidence of recurrent or metastatic disease. Bone scan on 05/01/2016 showed no evidence of osseous metastatic disease.  Patient was reinitiated on Lupron, a 23-month injection, on 07/10/2016 by Dr. Grayland Ormond at his initial evaluation.   He was evaluated by Dr. Beryle Lathe at [facility] on 10/26/2016.  He recommended discontinuation of Lupron as his PSA was < 4.0. He was to pursue intermittent therapy when PSA increased to about 10.  Testosterone and PSA checks were recommended every 2 months and PET/CT scan with F-18 fluciclovine to assess for oligomenorrhea static disease if PSA began to rise.  Axumin PET scan on 12/02/2017 revealed potential local recurrence in the residual left prostate gland, with the region of activity relatively small at approximately 1.5 cm. No  evidence of metastatic disease in the abdomen pelvis or skeleton.   He was seen on 09/02/2018 by Dr. Grayland Ormond.  At that time, PSA was 5.98.  He agreed to reinitiate Lupron (received 09/12/2018).  Axumin PET scan on 09/09/2018 revealed indeterminate activity in the prostate bed and no evidence of metastasis.   He was last seen in medical oncology clinic by Dr. Mike Gip on 03/16/2019.  At that time, PSA was 0.53.  Lupron has been held due to low PSA, hot flashes with fatigue, and preference.    PSA has been followed in the interim:  0.53 03/02/2019 0.34 05/11/2019 0.56 07/06/2019  Additionally, he has a history of low-grade urothelial carcinoma of the bladder.  Diagnosed in 2004 with recurrences in 2005 and 2007.  He had surveillance cystoscopy with Dr. Alto Denver 01/12/2019.  Repeat follow-up due February 2021.  In the interim, he had a colonoscopy with Dr. Gustavo Lah on 08/24/2019.  Specimens were negative for high-grade dysplasia or malignancy.  He was started on hydrocortisone for hemorrhoids.  Today,  hot flashes have resolved since holding Lupron.  He feels well but continues to have chronic urologic complaints for which she is followed by urology.  Denies neurologic complaints, chest pain, shortness of breath, weakness.  Appetite is stable.  He continues to follow a vegan diet and has gained 5 pounds since last visit.  He denies melena or hematochezia. Denies new aches and pains.  No recent fevers or illness.  Denies easy bleeding or bruising.  Denies other specific complaints.  Past medical history, past surgical history, and family history were reviewed and  updated as below.  Denies family history of prostate cancer.  Father passed away due to heart failure.  Past Medical History:  Diagnosis Date  . Allergic rhinitis   . Bladder cancer ([place])   . BPH (benign prostatic hyperplasia)   . Chronic back pain   . Essential hypertension   . Glaucoma   . Impotence of organic origin   . Liver  laceration   . Prostate cancer ([place])   . Stenosis, spinal, lumbar     Past Surgical History:  Procedure Laterality Date  . BACK SURGERY    . COLONOSCOPY    . COLONOSCOPY WITH PROPOFOL N/A 08/24/2019   Procedure: COLONOSCOPY WITH PROPOFOL;  Surgeon: Lollie Sails, MD;  Location: [facility] ENDOSCOPY;  Service: Endoscopy;  Laterality: N/A;  . HERNIA REPAIR    . NEPHRECTOMY Right 1978  . TRANSURETHRAL RESECTION OF BLADDER TUMOR WITH GYRUS (TURBT-GYRUS)      Family History  Problem Relation Age of Onset  . Cancer Father     Social History:  reports that he quit smoking about 55 years ago. He has never used smokeless tobacco. He reports that he does not drink alcohol or use drugs. He is a former smoker and smoked several packs per day in his early 8s but has not smoked in over 77 years. He does not drink alcohol. He is retired but used to work for [employer]. He denies known exposure to radiation or toxins. The patient is alone today.  Allergies:  Allergies  Allergen Reactions  . Ace Inhibitors Cough    Current Medications: Current Outpatient Medications  Medication Sig Dispense Refill  . aspirin EC 81 MG tablet Take 81 mg by mouth daily.     . fluticasone (FLONASE) 50 MCG/ACT nasal spray Place 1 spray into both nostrils daily as needed.     . hydrochlorothiazide (HYDRODIURIL) 25 MG tablet Take 25 mg by mouth daily.     Marland Kitchen latanoprost (XALATAN) 0.005 % ophthalmic solution Place 1 drop into both eyes at bedtime.     . montelukast (SINGULAIR) 10 MG tablet Take 10 mg by mouth daily.     . vitamin B-12 (CYANOCOBALAMIN) 1000 MCG tablet Take 1,000 mcg by mouth daily.     Marland Kitchen albuterol (VENTOLIN HFA) 108 (90 Base) MCG/ACT inhaler Inhale 2 puffs into the lungs every 4 (four) hours as needed for wheezing or shortness of breath.    . hydrocortisone (ANUSOL-HC) 25 MG suppository Place rectally.     No current facility-administered medications for this visit.     Review of Systems  Constitutional:  Negative for chills, diaphoresis, fever, malaise/fatigue and weight loss.       Feels well  HENT: Negative.  Negative for congestion, hearing loss, nosebleeds, sinus pain and sore throat.   Eyes: Negative.  Negative for blurred vision, double vision and redness.  Respiratory: Negative.  Negative for cough, shortness of breath and wheezing.   Cardiovascular: Negative.  Negative for chest pain, palpitations, leg swelling and PND.  Gastrointestinal: Negative for abdominal pain, blood in stool, constipation, diarrhea, nausea and vomiting.       Vegan diet.  Recent colonoscopy.  Hemorrhoids  Genitourinary: Negative for dysuria, frequency, hematuria and urgency.       Slow stream (chronic & unchanged)  Musculoskeletal: Negative.  Negative for back pain, falls, joint pain and myalgias.  Skin: Negative.  Negative for itching and rash.  Neurological: Negative for dizziness, tingling, sensory change, speech change, focal weakness, weakness and headaches.  Endo/Heme/Allergies:  Negative.  Does not bruise/bleed easily.  Psychiatric/Behavioral: Negative.  Negative for memory loss. The patient is not nervous/anxious and does not have insomnia.    Performance status (ECOG): 1  Vitals: Today's Vitals   08/31/19 1408  BP: (!) 157/71  Pulse: 79  Resp: 18  Temp: (!) 97.5 F (36.4 C)  TempSrc: Tympanic  SpO2: 100%  Weight: 229 lb 13.3 oz (104.2 kg)  PainSc: 0-No pain   Body mass index is 30.32 kg/m.   Physical Exam  Constitutional: He is oriented to person, place, and time. He appears well-developed and well-nourished. No distress.  Wearing mask.  Unaccompanied  HENT:  Head: Normocephalic and atraumatic.  Mouth/Throat: Oropharynx is clear and moist.  White hair and beard. Wearing a mask and gloves  Eyes: Conjunctivae are normal. No scleral icterus.  Glasses.  Brown eyes.  Neck: Normal range of motion. Neck supple. No JVD present.  Cardiovascular: Normal rate and regular rhythm. Exam reveals  no gallop and no friction rub.  No murmur heard. Pulmonary/Chest: Effort normal and breath sounds normal. No respiratory distress. He has no wheezes. He has no rales.  Abdominal: He exhibits no distension. There is no abdominal tenderness. There is no guarding.  Musculoskeletal: Normal range of motion.        General: No edema.  Lymphadenopathy:    He has no cervical adenopathy.    He has no axillary adenopathy.       Right: No supraclavicular adenopathy present.       Left: No supraclavicular adenopathy present.  Neurological: He is alert and oriented to person, place, and time.  Skin: Skin is warm and dry. He is not diaphoretic. No pallor.  Psychiatric: He has a normal mood and affect. His behavior is normal. Judgment and thought content normal.  Nursing note and vitals reviewed.   Imaging studies: 05/23/2014:  Prostate MRI revealed areas suspicious for extracapsular extension in the right posterior apex and along the posterior aspect of the anterior urethra.  05/01/2016:  Abdomen and pelvis CT revealed no evidence of recurrent or metastatic disease. Bone scan on 05/01/2016 showed no evidence of osseous metastatic disease. 12/02/2017:  Axumin PET scan revealed potential local recurrence in the residual left prostate gland, with the region of activity relatively small at approximately 1.5 cm. No evidence of metastatic disease in the abdomen pelvis or skeleton.  09/09/2018:  Axumin PET scan revealed indeterminate activity in the prostate bed and no evidence of metastasis.   Appointment on 08/31/2019  Component Date Value Ref Range Status  . Total Protein 08/31/2019 7.2  6.5 - 8.1 g/dL Final  . Albumin 08/31/2019 4.2  3.5 - 5.0 g/dL Final  . AST 08/31/2019 19  15 - 41 U/L Final  . ALT 08/31/2019 12  0 - 44 U/L Final  . Alkaline Phosphatase 08/31/2019 55  38 - 126 U/L Final  . Total Bilirubin 08/31/2019 0.5  0.3 - 1.2 mg/dL Final  . Bilirubin, Direct 08/31/2019 <0.1  0.0 - 0.2 mg/dL  Final  . Indirect Bilirubin 08/31/2019 NOT CALCULATED  0.3 - 0.9 mg/dL Final   Performed at [facility], [address] [place], [place] [address]  . WBC 08/31/2019 8.3  4.0 - 10.5 K/uL Final  . RBC 08/31/2019 3.88* 4.22 - 5.81 MIL/uL Final  . Hemoglobin 08/31/2019 12.3* 13.0 - 17.0 g/dL Final  . HCT 08/31/2019 37.0* 39.0 - 52.0 % Final  . MCV 08/31/2019 95.4  80.0 - 100.0 fL Final  . MCH  08/31/2019 31.7  26.0 - 34.0 pg Final  . MCHC 08/31/2019 33.2  30.0 - 36.0 g/dL Final  . RDW 08/31/2019 12.2  11.5 - 15.5 % Final  . Platelets 08/31/2019 194  150 - 400 K/uL Final  . nRBC 08/31/2019 0.0  0.0 - 0.2 % Final  . Neutrophils Relative % 08/31/2019 53  % Final  . Neutro Abs 08/31/2019 4.4  1.7 - 7.7 K/uL Final  . Lymphocytes Relative 08/31/2019 28  % Final  . Lymphs Abs 08/31/2019 2.3  0.7 - 4.0 K/uL Final  . Monocytes Relative 08/31/2019 8  % Final  . Monocytes Absolute 08/31/2019 0.7  0.1 - 1.0 K/uL Final  . Eosinophils Relative 08/31/2019 10  % Final  . Eosinophils Absolute 08/31/2019 0.8* 0.0 - 0.5 K/uL Final  . Basophils Relative 08/31/2019 1  % Final  . Basophils Absolute 08/31/2019 0.1  0.0 - 0.1 K/uL Final  . Immature Granulocytes 08/31/2019 0  % Final  . Abs Immature Granulocytes 08/31/2019 0.03  0.00 - 0.07 K/uL Final   Performed at [facility], [address], [place], [place] [address]    Assessment:  Kamryn Benshoof is a 78 y.o. male with prostate cancer diagnosed in 2009.  Gleason score was 4+3 in 2009.  He received cryoablation.   He developed a biochemical recurrence.  Prostate biopsy in 04/2009 revealed Gleason 5+4 adenocarcinoma.  He received IMRT with adjuvant hormonal therapy.  PSA nadir was < 0.1.  He has been on Lupron intermittently secondary to side effects. He received Lupron (45 mg; 6 month) on 09/12/2018.  He wishes to receive only 3 month Lupron injections.  Axumin PET scan on 09/09/2018 revealed indeterminate activity in the  prostate bed and no evidence of metastasis.  PSA has been followed: 0.6 on 07/15/2012, 1.3 on 07/14/2013, 3.64 on 02/10/2016, 0.55 on 09/22/2016, 0.18 on 02/17/2017, 2.32 on 11/17/2017, 4.64 on 03/04/2018, 5.31 on 06/03/2018, 5.98 on 09/01/2018, and 0.53 on 03/02/2019.  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  He undergoes surveillance cystoscopy by Dr. Alto Denver [facility]).   Last cystoscopy was normal on 01/12/2019.  He has a normocytic anemia.  He follows a vegan diet.  He denies any melena or hematochezia.  Symptomatically, he is doing well.  He reports hot flashes with fatigue, several times daily, following Lupron treatments.   Plan: 1.   Reviewed labs from today: CBC.  PSA, testosterone, hepatic function panel are pending 2.   Prostate cancer  Patient initially diagnosed in 2009.  He has received cryotherapy and IMRT.  With biochemical recurrence, he has received Lupron intermittently.  Declines Lupron today.  Discuss prior plan for intermittent Lupron when PSA > 10.  Request for Lupron 67-month injections when needed secondary to side effects 3.   Normocytic anemia  Denies bleeding.  Hemoglobin today 12.3-stable  History of hemorrhoids.  Prior work-up revealed slightly low vitamin B12.   Encouraged continuation of oral B12 supplementation and dietary sources  Disposition  RTC every 2 months x 2 for labs (CBC with diff, LFTs, PSA, testosterone)  RTC in 6 months for MD assessment, labs (CBC with diff, CMP, PSA, testosterone), and +/- Lupron.  I discussed the assessment and treatment plan with the patient.  The patient was provided an opportunity to ask questions and all were answered.  The patient agreed with the plan and demonstrated an understanding of the instructions.  The patient was  advised to call back if the symptoms worsen or if the condition fails to improve as anticipated.  Beckey Rutter, DNP,  AGNP-C [facility] (work cell) [phone] (office)  CC: Dr. Mike Gip

## 2019-09-01 LAB — TESTOSTERONE: Testosterone: 76 ng/dL — ABNORMAL LOW (ref 264–916)

## 2019-11-01 IMAGING — CT NM PET NOPR SKULL BASE TO THIGH
1 of 7 series · 1 of 25 positions shown · non-contrast
Comparison: Fluciclovine PET-CT scan 12/02/2017

CLINICAL DATA: Prostate carcinoma with biochemical recurrence.

EXAM:
NUCLEAR MEDICINE PET SKULL BASE TO THIGH
TECHNIQUE: 11.0 mCi F-18 Fluciclovine was injected intravenously. Full-ring PET
imaging was performed from the skull base to thigh after the
radiotracer. CT data was obtained and used for attenuation
correction and anatomic localization.

[Series 3: ct wb 5.0 b30f · axial · 5.0mm · 0.98mm/px · 1 of 329 slices shown]
[im 329/329  brain]
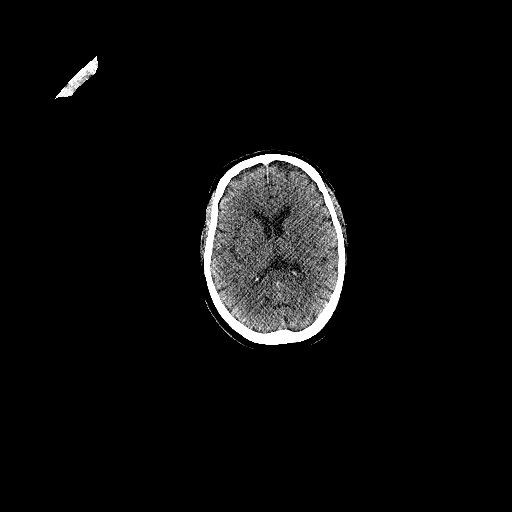

[1 of 25 positions shown; findings below may reference images not displayed]

FINDINGS: NECK

No radiotracer activity in neck lymph nodes.

Incidental CT finding: None

CHEST

No radiotracer accumulation within mediastinal or hilar lymph nodes.
No suspicious pulmonary nodules on the CT scan.

Incidental CT finding: None

ABDOMEN/PELVIS

Prostate: Activity again noted centrally within prostate gland.
There is intense activity in the bladder associated urine.
Additionally there is intense activity inferior to the prostate.
Query whether this activity is related to prostate carcinoma or
potentially TURP defect and urine activity in the prostate gland.
Favor the latter (ie urine). No significant change from prior.

No abnormal radiotracer activity within pelvic or abdominal lymph
nodes

Lymph nodes: No abnormal radiotracer accumulation within pelvic or
abdominal nodes.

Liver: No evidence of liver metastasis

Incidental CT finding: None

SKELETON

No focal  activity to suggest skeletal metastasis.
IMPRESSION: 1. Indeterminate activity in the prostate bed.
2. No evidence of metastatic lymphadenopathy.
3. No evidence soft tissue metastasis or skeletal metastasis.

## 2020-02-13 ENCOUNTER — Inpatient Hospital Stay: Payer: Medicare Other | Attending: Hematology and Oncology

## 2020-02-13 DIAGNOSIS — C61 Malignant neoplasm of prostate: Secondary | ICD-10-CM

## 2020-02-13 DIAGNOSIS — Z79818 Long term (current) use of other agents affecting estrogen receptors and estrogen levels: Secondary | ICD-10-CM | POA: Insufficient documentation

## 2020-02-13 DIAGNOSIS — Z923 Personal history of irradiation: Secondary | ICD-10-CM | POA: Diagnosis not present

## 2020-02-13 LAB — CBC WITH DIFFERENTIAL/PLATELET
Abs Immature Granulocytes: 0.04 10*3/uL (ref 0.00–0.07)
Basophils Absolute: 0.1 10*3/uL (ref 0.0–0.1)
Basophils Relative: 1 %
Eosinophils Absolute: 0.6 10*3/uL — ABNORMAL HIGH (ref 0.0–0.5)
Eosinophils Relative: 9 %
HCT: 38.2 % — ABNORMAL LOW (ref 39.0–52.0)
Hemoglobin: 12.4 g/dL — ABNORMAL LOW (ref 13.0–17.0)
Immature Granulocytes: 1 %
Lymphocytes Relative: 22 %
Lymphs Abs: 1.6 10*3/uL (ref 0.7–4.0)
MCH: 31.1 pg (ref 26.0–34.0)
MCHC: 32.5 g/dL (ref 30.0–36.0)
MCV: 95.7 fL (ref 80.0–100.0)
Monocytes Absolute: 0.5 10*3/uL (ref 0.1–1.0)
Monocytes Relative: 7 %
Neutro Abs: 4.2 10*3/uL (ref 1.7–7.7)
Neutrophils Relative %: 60 %
Platelets: 219 10*3/uL (ref 150–400)
RBC: 3.99 MIL/uL — ABNORMAL LOW (ref 4.22–5.81)
RDW: 12.3 % (ref 11.5–15.5)
WBC: 7 10*3/uL (ref 4.0–10.5)
nRBC: 0 % (ref 0.0–0.2)

## 2020-02-13 LAB — COMPREHENSIVE METABOLIC PANEL
ALT: 13 U/L (ref 0–44)
AST: 19 U/L (ref 15–41)
Albumin: 4.2 g/dL (ref 3.5–5.0)
Alkaline Phosphatase: 53 U/L (ref 38–126)
Anion gap: 7 (ref 5–15)
BUN: 11 mg/dL (ref 8–23)
CO2: 30 mmol/L (ref 22–32)
Calcium: 9 mg/dL (ref 8.9–10.3)
Chloride: 100 mmol/L (ref 98–111)
Creatinine, Ser: 0.98 mg/dL (ref 0.61–1.24)
GFR calc Af Amer: >60 mL/min (ref >60)
GFR calc non Af Amer: >60 mL/min (ref >60)
Glucose, Bld: 112 mg/dL — ABNORMAL HIGH (ref 70–99)
Potassium: 3.7 mmol/L (ref 3.5–5.1)
Sodium: 137 mmol/L (ref 135–145)
Total Bilirubin: 0.7 mg/dL (ref 0.3–1.2)
Total Protein: 7 g/dL (ref 6.5–8.1)

## 2020-02-13 LAB — PSA: Prostatic Specific Antigen: 3.66 ng/mL (ref 0.00–4.00)

## 2020-02-14 LAB — TESTOSTERONE: Testosterone: 149 ng/dL — ABNORMAL LOW (ref 264–916)

## 2020-02-19 NOTE — Progress Notes (Signed)
[facility]  [address] [place], [place] [address] Phone: [phone]  Fax: [phone]   Clinic Day:  02/20/2020  Referring physician: Ezequiel Kayser, MD  Chief Complaint: James Lucas is a 79 y.o. male with prostate cancer who is seen for a 1 year assessment.  HPI: The patient was last seen in the medical oncology clinic on 03/16/2019 as a new patient. At that time, he was doing well. He reported hot flashes with fatigue, several times daily, following Lupron treatments. I discussed plans for intermittent Lupron when PSA > 10. Patient was due for Lupron that day but declined.   PSA was 0.53.  Labs on 03/02/2019 revealed a hematocrit of 37.1, hemoglobin 12.2, MCV 97.4, platelets 200,000, WBC 5800 with an [place] of 3400.  Creatinine was 0.87.  LFTs were normal.  Work up showed ferritin of 43 with an iron saturation of 22% and a TIBC of 335. Retic count was 1.0%. Vitamin B12 was 96 (low) and folate 20.9.   He was contacted on 03/21/2019 to begin oral B12 1000 mcg a day with a follow-up lab in 1 month.  Colonoscopy on 08/24/2019 with Dr. Gustavo Lah showed preparation of the colon was fair. There was diverticulosis in the sigmoid colon, descending colon, transverse colon, ascending colon and cecum. There was one 8 mm polyp in the transverse colon, one 4 mm polyp in the proximal transverse colon, and two 4 to 5 mm polyps in the ascending colon. There were non-bleeding internal hemorrhoids.  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 There was no high grade high-grade dysplasia and malignancy.   Patient was last seen in the medical oncology clinic of 08/31/2019 by Beckey Rutter, NP for a 6 month follow up.  At that time, he was doing well. PSA was 1.33.  Patient declined Lupron. Patient was advised to continued oral B12 supplementation and dietary sources.   Labs  followed: 05/11/2019: Hematocrit 37.9, hemoglobin 12.5, platelets 195,000, WBC 6,800. PSA was 0.34. Testosterone was <3. LFTs were normal.  07/06/2019: Hematocrit 37.0, hemoglobin 12.4, platelets 199,000, WBC 8,900. PSA was 0.56. Testosterone was 34. LFTs were normal.  08/31/2019: Hematocrit 37.0, hemoglobin 12.3, platelets 194,000, WBC 8,300. PSA was 1.33. Testosterone was 76. LFTs were normal.  01/30/2020  Hematocrit 38.7, hemoglobin 12.5, platelets 208,000, WBC 7,200. Vitamin B12 was 607.  02/13/2020: Hematocrit 38.2, hemoglobin 12.4, platelets 219,000, WBC 7,000. PSA was 3.66. Testosterone was 149.   During the interim, the patient has felt "fine". He is taking oral B12 1,000 mcg daily. He has hot flashes every now and then. His energy level is fair. He denies any black or bloody stool. He has no hematuria. He has exertional shortness of breath. He is currently working on cutting down a pecan tree in his backyard.   He would like for his PSA checked in 1 month and wished to postpone reinitiation of Lupron  today.    Past Medical History:  Diagnosis Date  . Allergic rhinitis   . Bladder cancer ([place])   . BPH (benign prostatic hyperplasia)   . Chronic back pain   . Essential hypertension   . Glaucoma   . Impotence of organic origin   . Liver laceration   . Prostate cancer ([place])   . Stenosis, spinal, lumbar     Past Surgical History:  Procedure Laterality Date  . BACK SURGERY    . COLONOSCOPY    .  COLONOSCOPY WITH PROPOFOL N/A 08/24/2019   Procedure: COLONOSCOPY WITH PROPOFOL;  Surgeon: Lollie Sails, MD;  Location: [facility] ENDOSCOPY;  Service: Endoscopy;  Laterality: N/A;  . HERNIA REPAIR    . NEPHRECTOMY Right 1978  . TRANSURETHRAL RESECTION OF BLADDER TUMOR WITH GYRUS (TURBT-GYRUS)      Family History  Problem Relation Age of Onset  . Cancer Father     Social History:  reports that he quit smoking about 56 years ago. He has never used smokeless tobacco. He reports that  he does not drink alcohol or use drugs. He is a former smoker and smoked several packs per day in his early 9s but has not smoked in over 68 years. He does not drink alcohol. He is retired but used to work for [employer]. He denies known exposure to radiation or toxins. The patient is alone today.  Allergies:  Allergies  Allergen Reactions  . Ace Inhibitors Cough    Current Medications: Current Outpatient Medications  Medication Sig Dispense Refill  . aspirin EC 81 MG tablet Take 81 mg by mouth daily.     . fluticasone (FLONASE) 50 MCG/ACT nasal spray Place 1 spray into both nostrils daily as needed.     . hydrochlorothiazide (HYDRODIURIL) 25 MG tablet Take 25 mg by mouth daily.     Marland Kitchen latanoprost (XALATAN) 0.005 % ophthalmic solution Place 1 drop into both eyes at bedtime.     . montelukast (SINGULAIR) 10 MG tablet Take 10 mg by mouth daily.     . vitamin B-12 (CYANOCOBALAMIN) 1000 MCG tablet Take 1,000 mcg by mouth daily.     Marland Kitchen albuterol (VENTOLIN HFA) 108 (90 Base) MCG/ACT inhaler Inhale 2 puffs into the lungs every 4 (four) hours as needed for wheezing or shortness of breath.     No current facility-administered medications for this visit.    Review of Systems  Constitutional: Positive for diaphoresis (due to hot flashes; every now and then). Negative for chills, fever, malaise/fatigue and weight loss (up 1 lb).       "I'm doing fine". Good energy.  HENT: Negative.  Negative for congestion, hearing loss, nosebleeds, sinus pain and sore throat.   Eyes: Negative.  Negative for blurred vision, double vision and redness.  Respiratory: Positive for shortness of breath (exertional). Negative for cough, sputum production and wheezing.   Cardiovascular: Negative.  Negative for chest pain, palpitations, leg swelling and PND.  Gastrointestinal: Negative for abdominal pain, blood in stool, constipation, diarrhea, heartburn, melena, nausea and vomiting.       Vegan diet.  Genitourinary: Negative.   Negative for dysuria, frequency, hematuria and urgency.  Musculoskeletal: Negative.  Negative for back pain, falls, joint pain and myalgias.  Skin: Negative.  Negative for itching and rash.  Neurological: Negative.  Negative for dizziness, tingling, sensory change, speech change, focal weakness, weakness and headaches.  Endo/Heme/Allergies: Negative.  Does not bruise/bleed easily.  Psychiatric/Behavioral: Negative.  Negative for depression and memory loss. The patient is not nervous/anxious and does not have insomnia.   All other systems reviewed and are negative.   Performance status (ECOG): 1  Vitals Blood pressure (!) 151/74, pulse 72, temperature (!) 96.5 F (35.8 C), temperature source Tympanic, resp. rate 18, height 6\' 1"  (1.854 m), weight 225 lb 15.5 oz (102.5 kg), SpO2 100 %.   Physical Exam  Constitutional: He is oriented to person, place, and time. He appears well-developed and well-nourished. No distress.  HENT:  Head: Normocephalic and atraumatic.  Mouth/Throat: Oropharynx is  clear and moist.  Red cap.  White hair and beard. Mask.  Eyes: Pupils are equal, round, and reactive to light. Conjunctivae and EOM are normal. No scleral icterus.  Glasses.  Brown eyes.  Neck: No JVD present.  Cardiovascular: Normal rate, regular rhythm and normal heart sounds. Exam reveals no gallop and no friction rub.  No murmur heard. Pulmonary/Chest: Effort normal and breath sounds normal. No respiratory distress. He has no wheezes. He has no rales.  Abdominal: Soft. Bowel sounds are normal. He exhibits no distension and no mass. There is no abdominal tenderness. There is no rebound and no guarding.  Musculoskeletal:        General: No edema. Normal range of motion.     Cervical back: Normal range of motion and neck supple.  Lymphadenopathy:       Head (right side): No preauricular, no posterior auricular and no occipital adenopathy present.       Head (left side): No preauricular, no posterior  auricular and no occipital adenopathy present.    He has no cervical adenopathy.    He has no axillary adenopathy.       Right: No inguinal and no supraclavicular adenopathy present.       Left: No inguinal and no supraclavicular adenopathy present.  Neurological: He is alert and oriented to person, place, and time. He has normal reflexes.  Skin: Skin is warm. No rash noted. He is not diaphoretic. No erythema. No pallor.  Psychiatric: He has a normal mood and affect. His behavior is normal. Judgment and thought content normal.  Nursing note and vitals reviewed.  Imaging studies: 05/23/2014:  Prostate MRI revealed areas suspicious for extracapsular extension in the right posterior apex and along the posterior aspect of the anterior urethra. 05/01/2016:  Abdomen and pelvis CT revealed no evidence of recurrent or metastatic disease. Bone scan on 05/01/2016 showed no evidence of osseous metastatic disease. 12/02/2017:  Axumin PET scan revealed potential local recurrence in the residual left prostate gland, with the region of activity relatively small at approximately 1.5 cm. No evidence of metastatic disease in the abdomen pelvis or skeleton.  09/09/2018:  Axumin PET scan revealed indeterminate activity in the prostate bed and no evidence of metastasis.   No visits with results within 3 Day(s) from this visit.  Latest known visit with results is:  Appointment on 02/13/2020  Component Date Value Ref Range Status  . Sodium 02/13/2020 137  135 - 145 mmol/L Final  . Potassium 02/13/2020 3.7  3.5 - 5.1 mmol/L Final  . Chloride 02/13/2020 100  98 - 111 mmol/L Final  . CO2 02/13/2020 30  22 - 32 mmol/L Final  . Glucose, Bld 02/13/2020 112* 70 - 99 mg/dL Final   Glucose reference range applies only to samples taken after fasting for at least 8 hours.  . BUN 02/13/2020 11  8 - 23 mg/dL Final  . Creatinine, Ser 02/13/2020 0.98  0.61 - 1.24 mg/dL Final  . Calcium 02/13/2020 9.0  8.9 - 10.3 mg/dL Final   . Total Protein 02/13/2020 7.0  6.5 - 8.1 g/dL Final  . Albumin 02/13/2020 4.2  3.5 - 5.0 g/dL Final  . AST 02/13/2020 19  15 - 41 U/L Final  . ALT 02/13/2020 13  0 - 44 U/L Final  . Alkaline Phosphatase 02/13/2020 53  38 - 126 U/L Final  . Total Bilirubin 02/13/2020 0.7  0.3 - 1.2 mg/dL Final  . GFR calc non Af Amer 02/13/2020 >60  >60  mL/min Final  . GFR calc Af Amer 02/13/2020 >60  >60 mL/min Final  . Anion gap 02/13/2020 7  5 - 15 Final   Performed at [facility], [address], [place], [place] [address]  . Prostatic Specific Antigen 02/13/2020 3.66  0.00 - 4.00 ng/mL Final   Comment: (NOTE) While PSA levels of <=4.0 ng/ml are reported as reference range, some men with levels below 4.0 ng/ml can have prostate cancer and many men with PSA above 4.0 ng/ml do not have prostate cancer.  Other tests such as free PSA, age specific reference ranges, PSA velocity and PSA doubling time may be helpful especially in men less than 59 years old. Performed at [facility], [place] [address] [place], [place] [address]   . WBC 02/13/2020 7.0  4.0 - 10.5 K/uL Final  . RBC 02/13/2020 3.99* 4.22 - 5.81 MIL/uL Final  . Hemoglobin 02/13/2020 12.4* 13.0 - 17.0 g/dL Final  . HCT 02/13/2020 38.2* 39.0 - 52.0 % Final  . MCV 02/13/2020 95.7  80.0 - 100.0 fL Final  . MCH 02/13/2020 31.1  26.0 - 34.0 pg Final  . MCHC 02/13/2020 32.5  30.0 - 36.0 g/dL Final  . RDW 02/13/2020 12.3  11.5 - 15.5 % Final  . Platelets 02/13/2020 219  150 - 400 K/uL Final  . nRBC 02/13/2020 0.0  0.0 - 0.2 % Final  . Neutrophils Relative % 02/13/2020 60  % Final  . Neutro Abs 02/13/2020 4.2  1.7 - 7.7 K/uL Final  . Lymphocytes Relative 02/13/2020 22  % Final  . Lymphs Abs 02/13/2020 1.6  0.7 - 4.0 K/uL Final  . Monocytes Relative 02/13/2020 7  % Final  . Monocytes Absolute 02/13/2020 0.5  0.1 - 1.0 K/uL Final  . Eosinophils Relative 02/13/2020 9  % Final  . Eosinophils Absolute 02/13/2020 0.6* 0.0 - 0.5  K/uL Final  . Basophils Relative 02/13/2020 1  % Final  . Basophils Absolute 02/13/2020 0.1  0.0 - 0.1 K/uL Final  . Immature Granulocytes 02/13/2020 1  % Final  . Abs Immature Granulocytes 02/13/2020 0.04  0.00 - 0.07 K/uL Final   Performed at [facility], [address] [place], [place] [address]  . Testosterone 02/13/2020 149* 264 - 916 ng/dL Final   Comment: (NOTE) Adult male reference interval is based on a population of healthy nonobese males (BMI <30) between 51 and 64 years old. [place], [place] [phone]. PMID: [id number]. Performed At: [facility] [place], [place] [id number] Rush Farmer MD [id number]     Assessment:  James Lucas is a 79 y.o. male with prostate cancer diagnosed in 2009.  Gleason score was 4+3 in 2009.  He received cryoablation.   He developed a biochemical recurrence.  Prostate biopsy in 04/2009 revealed Gleason 5+4 adenocarcinoma.  He received IMRT with adjuvant hormonal therapy.  PSA nadir was < 0.1.  He has been on intermittent Lupron secondary to side effects. He received Lupron (45 mg; 6 month) on 09/12/2018.  He wishes to receive only 3 month Lupron injections.  Axumin PET scan on 09/09/2018 revealed indeterminate activity in the prostate bed and no evidence of metastasis.  PSA has been followed: 0.6 on 07/15/2012, 1.3 on 07/14/2013, 3.64 on 02/10/2016, 0.55 on 09/22/2016, 0.18 on 02/17/2017, 2.32 on 11/17/2017, 4.64 on 03/04/2018, 5.31 on 06/03/2018, 5.98 on 09/01/2018, 0.53 on 03/02/2019, 0.34 on 05/11/2019, 0.56 on 07/06/2019, 1.33 on 08/31/2019, and 3.66 on 02/13/2020.  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 He undergoes surveillance cystoscopy by Dr. [facility]).  Last cystoscopy was normal on 01/12/2019.  He has a normocytic anemia.  Ferritin of 43 with an iron saturation of 22% and a TIBC of 335 on  03/16/2019. Retic count was 1.0%.  He follows a vegan diet.  He denies any melena or hematochezia.  Colonoscopy on 08/24/2019 revealed diverticulosis in the sigmoid colon, descending colon,transverse colon, ascending colon and cecum. There was one 8 mm polyp in the transverse colon, one 4 mm polyp in the proximal transverse colon, and two 4 to 5 mm polyps in the ascending colon. There were non-bleeding internal hemorrhoids.  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 There was no high grade high-grade dysplasia and malignancy.   He has B12 deficiency.  B12 was 96 on 03/16/2019 and 607 on 01/30/2020.  He is on oral B12.  Folate was 20.9 on 03/02/2019.  Symptomatically, he feels "fine".  He has off and on hot flashes.  He denies any bone pain.  Exam is unremarkable.  Plan: 1.   Review labs from 02/13/2020. 2.   Prostate cancer             Patient initially diagnosed in 2009.             He has received cryotherapy and IMRT.             With biochemical recurrence, he has received Lupron intermittently.             PSA is 3.66.  He declines Lupron today, but would like to be rechecked in 1 month.             PSA doubling time 4-5 months.  Prior plan was intermittent Lupron when PSA > 10.             Patient would like Lupron 85-month injections when needed secondary to side effects. 3.   B12 deficiency  He is on oral B12.   B12 was 96 on 03/16/2019 and 607 on 01/30/2020.  Check folate annually. 4.   Normocytic anemia             Hematocrit 38.2.  Hemoglobin 12.4.  MCV 95.7.  Colonoscopy on 08/24/2019 revealed 2 tubular adenomas.   Next colonoscopy in 3 years per patient.            Continue to monitor. 5.   No Lupron today. 6.   RTC in 1 month for MD assessment, labs (CBC with diff, CMP, PSA- day before), and +/- Lupron.  I discussed the assessment and treatment plan with the patient.  The patient was  provided an opportunity to ask questions and all were answered.  The patient agreed with the plan and demonstrated an understanding of the instructions.  The patient was advised to call back if the symptoms worsen or if the condition fails to improve as anticipated.  I provided 15 minutes of face-to-face time during this this encounter and > 50% was spent counseling as documented under my assessment and plan.    Lequita Asal, MD, PhD    02/20/2020, 8:57 AM  I, Selena Batten, am acting as scribe for [employer]. Mike Gip, MD, PhD.  I, Shawntel Farnworth C. Mike Gip, MD, have reviewed the above documentation for accuracy and completeness, and I agree with the above.

## 2020-02-20 ENCOUNTER — Inpatient Hospital Stay: Payer: Medicare Other | Attending: Hematology and Oncology | Admitting: Hematology and Oncology

## 2020-02-20 ENCOUNTER — Other Ambulatory Visit: Payer: Self-pay

## 2020-02-20 ENCOUNTER — Encounter: Payer: Self-pay | Admitting: Hematology and Oncology

## 2020-02-20 VITALS — BP 151/74 | HR 72 | Temp 96.5°F | Resp 18 | Ht 73.0 in | Wt 226.0 lb

## 2020-02-20 DIAGNOSIS — N400 Enlarged prostate without lower urinary tract symptoms: Secondary | ICD-10-CM | POA: Insufficient documentation

## 2020-02-20 DIAGNOSIS — Z905 Acquired absence of kidney: Secondary | ICD-10-CM | POA: Insufficient documentation

## 2020-02-20 DIAGNOSIS — Z923 Personal history of irradiation: Secondary | ICD-10-CM | POA: Diagnosis not present

## 2020-02-20 DIAGNOSIS — Z7982 Long term (current) use of aspirin: Secondary | ICD-10-CM | POA: Insufficient documentation

## 2020-02-20 DIAGNOSIS — D649 Anemia, unspecified: Secondary | ICD-10-CM

## 2020-02-20 DIAGNOSIS — I10 Essential (primary) hypertension: Secondary | ICD-10-CM | POA: Diagnosis not present

## 2020-02-20 DIAGNOSIS — C61 Malignant neoplasm of prostate: Secondary | ICD-10-CM

## 2020-02-20 DIAGNOSIS — Z87891 Personal history of nicotine dependence: Secondary | ICD-10-CM | POA: Diagnosis not present

## 2020-02-20 DIAGNOSIS — E538 Deficiency of other specified B group vitamins: Secondary | ICD-10-CM

## 2020-02-20 DIAGNOSIS — Z8551 Personal history of malignant neoplasm of bladder: Secondary | ICD-10-CM | POA: Insufficient documentation

## 2020-02-20 DIAGNOSIS — Z79899 Other long term (current) drug therapy: Secondary | ICD-10-CM | POA: Diagnosis not present

## 2020-02-20 DIAGNOSIS — Z809 Family history of malignant neoplasm, unspecified: Secondary | ICD-10-CM | POA: Diagnosis not present

## 2020-02-20 NOTE — Progress Notes (Signed)
No new changes noted today 

## 2020-03-12 ENCOUNTER — Other Ambulatory Visit: Payer: Self-pay | Admitting: Hematology and Oncology

## 2020-03-18 ENCOUNTER — Encounter: Payer: Self-pay | Admitting: Hematology and Oncology

## 2020-03-18 ENCOUNTER — Other Ambulatory Visit: Payer: Self-pay

## 2020-03-18 ENCOUNTER — Inpatient Hospital Stay: Payer: Medicare Other | Attending: Hematology and Oncology

## 2020-03-18 DIAGNOSIS — N400 Enlarged prostate without lower urinary tract symptoms: Secondary | ICD-10-CM | POA: Diagnosis not present

## 2020-03-18 DIAGNOSIS — I10 Essential (primary) hypertension: Secondary | ICD-10-CM | POA: Insufficient documentation

## 2020-03-18 DIAGNOSIS — Z8551 Personal history of malignant neoplasm of bladder: Secondary | ICD-10-CM | POA: Diagnosis not present

## 2020-03-18 DIAGNOSIS — Z79899 Other long term (current) drug therapy: Secondary | ICD-10-CM | POA: Insufficient documentation

## 2020-03-18 DIAGNOSIS — R0602 Shortness of breath: Secondary | ICD-10-CM | POA: Insufficient documentation

## 2020-03-18 DIAGNOSIS — C61 Malignant neoplasm of prostate: Secondary | ICD-10-CM | POA: Insufficient documentation

## 2020-03-18 DIAGNOSIS — D649 Anemia, unspecified: Secondary | ICD-10-CM | POA: Diagnosis not present

## 2020-03-18 DIAGNOSIS — Z7982 Long term (current) use of aspirin: Secondary | ICD-10-CM | POA: Insufficient documentation

## 2020-03-18 DIAGNOSIS — Z87891 Personal history of nicotine dependence: Secondary | ICD-10-CM | POA: Diagnosis not present

## 2020-03-18 DIAGNOSIS — Z905 Acquired absence of kidney: Secondary | ICD-10-CM | POA: Insufficient documentation

## 2020-03-18 DIAGNOSIS — R232 Flushing: Secondary | ICD-10-CM | POA: Insufficient documentation

## 2020-03-18 LAB — CBC WITH DIFFERENTIAL/PLATELET
Abs Immature Granulocytes: 0.01 10*3/uL (ref 0.00–0.07)
Basophils Absolute: 0.1 10*3/uL (ref 0.0–0.1)
Basophils Relative: 1 %
Eosinophils Absolute: 0.2 10*3/uL (ref 0.0–0.5)
Eosinophils Relative: 3 %
HCT: 38.2 % — ABNORMAL LOW (ref 39.0–52.0)
Hemoglobin: 12.4 g/dL — ABNORMAL LOW (ref 13.0–17.0)
Immature Granulocytes: 0 %
Lymphocytes Relative: 24 %
Lymphs Abs: 1.8 10*3/uL (ref 0.7–4.0)
MCH: 31.2 pg (ref 26.0–34.0)
MCHC: 32.5 g/dL (ref 30.0–36.0)
MCV: 96 fL (ref 80.0–100.0)
Monocytes Absolute: 0.4 10*3/uL (ref 0.1–1.0)
Monocytes Relative: 6 %
Neutro Abs: 4.9 10*3/uL (ref 1.7–7.7)
Neutrophils Relative %: 66 %
Platelets: 194 10*3/uL (ref 150–400)
RBC: 3.98 MIL/uL — ABNORMAL LOW (ref 4.22–5.81)
RDW: 12.1 % (ref 11.5–15.5)
WBC: 7.4 10*3/uL (ref 4.0–10.5)
nRBC: 0 % (ref 0.0–0.2)

## 2020-03-18 LAB — COMPREHENSIVE METABOLIC PANEL
ALT: 9 U/L (ref 0–44)
AST: 18 U/L (ref 15–41)
Albumin: 4.1 g/dL (ref 3.5–5.0)
Alkaline Phosphatase: 52 U/L (ref 38–126)
Anion gap: 7 (ref 5–15)
BUN: 11 mg/dL (ref 8–23)
CO2: 29 mmol/L (ref 22–32)
Calcium: 8.8 mg/dL — ABNORMAL LOW (ref 8.9–10.3)
Chloride: 98 mmol/L (ref 98–111)
Creatinine, Ser: 0.93 mg/dL (ref 0.61–1.24)
GFR calc Af Amer: >60 mL/min (ref >60)
GFR calc non Af Amer: >60 mL/min (ref >60)
Glucose, Bld: 133 mg/dL — ABNORMAL HIGH (ref 70–99)
Potassium: 4 mmol/L (ref 3.5–5.1)
Sodium: 134 mmol/L — ABNORMAL LOW (ref 135–145)
Total Bilirubin: 0.7 mg/dL (ref 0.3–1.2)
Total Protein: 6.8 g/dL (ref 6.5–8.1)

## 2020-03-18 LAB — PSA: Prostatic Specific Antigen: 3.68 ng/mL (ref 0.00–4.00)

## 2020-03-18 NOTE — Progress Notes (Signed)
[facility]  [address] [place], [place] [address] Phone: [phone]  Fax: [phone]   Clinic Day:  03/19/2020   Referring physician: Ezequiel Kayser, MD  Chief Complaint: James Lucas is a 79 y.o. male with a long-standing history of prostate cancer who is seen for 1 month assessment and consideration of reinitiation of Lupron.  HPI:  The patient was last seen in the medical oncology clinic on 02/20/2020. At that time, he he felt "fine".  He described hot flashes "now and then".  He denied any bleeding.  He had shortness of breath on exertion.  Hematocrit was 38.2, hemoglobin 12.4, MCV 95.7, platelets 219,000, WBC 7000 with an ANC of 4200. PSA was 3.66.  He met with his urologist, Dr. Curley Spice, on 03/07/2020, who said he recommended no further surveillance unless symptomatic as he is 14 years out from last recurrence of his bladder cancer.   03/18/2020: Hematocrit 38.2, hemoglobin 12.4, MCV 96.2, platelets 194,000, WBC 7,400, ANC 4,900. PSA  3.68.   During the interim, he has been well overall.    Past Medical History:  Diagnosis Date  . Allergic rhinitis   . Bladder cancer ([place])   . BPH (benign prostatic hyperplasia)   . Chronic back pain   . Essential hypertension   . Glaucoma   . Impotence of organic origin   . Liver laceration   . Prostate cancer ([place])   . Stenosis, spinal, lumbar     Past Surgical History:  Procedure Laterality Date  . BACK SURGERY    . COLONOSCOPY    . COLONOSCOPY WITH PROPOFOL N/A 08/24/2019   Procedure: COLONOSCOPY WITH PROPOFOL;  Surgeon: Lollie Sails, MD;  Location: [facility] ENDOSCOPY;  Service: Endoscopy;  Laterality: N/A;  . HERNIA REPAIR    . NEPHRECTOMY Right 1978  . TRANSURETHRAL RESECTION OF BLADDER TUMOR WITH GYRUS (TURBT-GYRUS)      Family History  Problem Relation Age of Onset  . Cancer Father     Social History:  reports that he quit smoking about 56 years ago. He has never used  smokeless tobacco. He reports that he does not drink alcohol or use drugs. He is a former smoker and smoked several packs per day in his early 48s but has not smoked in over 79 years. He does not drink alcohol. He is retired but used to work for [employer]. He denies known exposure to radiation or toxins. The patient is alone today.   Allergies:  Allergies  Allergen Reactions  . Ace Inhibitors Cough    Current Medications: Current Outpatient Medications  Medication Sig Dispense Refill  . albuterol (VENTOLIN HFA) 108 (90 Base) MCG/ACT inhaler Inhale 2 puffs into the lungs every 4 (four) hours as needed for wheezing or shortness of breath.    Marland Kitchen aspirin EC 81 MG tablet Take 81 mg by mouth daily.     . fluticasone (FLONASE) 50 MCG/ACT nasal spray Place 1 spray into both nostrils daily as needed.     . hydrochlorothiazide (HYDRODIURIL) 25 MG tablet Take 25 mg by mouth daily.     Marland Kitchen latanoprost (XALATAN) 0.005 % ophthalmic solution Place 1 drop into both eyes at bedtime.     . montelukast (SINGULAIR) 10 MG tablet Take 10 mg by mouth daily.     . vitamin B-12 (CYANOCOBALAMIN) 1000 MCG tablet Take 1,000 mcg by mouth daily.      No current facility-administered medications for this visit.    Review of Systems  Constitutional: Positive for diaphoresis (due to hot flashes), malaise/fatigue and weight loss (Down 9 lbs). Negative for chills and fever.       Doing well.   HENT: Negative.  Negative for congestion, hearing loss, nosebleeds, sinus pain and sore throat.   Eyes: Negative.  Negative for blurred vision, double vision and redness.  Respiratory: Negative.  Negative for cough, shortness of breath and wheezing.   Cardiovascular: Negative.  Negative for chest pain, palpitations, leg swelling and PND.  Gastrointestinal: Positive for blood in stool (resolved). Negative for abdominal pain, constipation, diarrhea, nausea and vomiting.       Vegan diet.  Genitourinary: Negative for dysuria, frequency,  hematuria (resolved) and urgency.  Musculoskeletal: Negative.  Negative for back pain, joint pain and myalgias.  Skin: Negative.  Negative for itching and rash.  Neurological: Negative for dizziness, tingling, sensory change, speech change, focal weakness, weakness and headaches.  Endo/Heme/Allergies: Negative.  Does not bruise/bleed easily.  Psychiatric/Behavioral: Negative.  Negative for memory loss. The patient is not nervous/anxious and does not have insomnia.    Performance status (ECOG): 1 - Symptomatic but completely ambulatory   Blood pressure (!) 143/69, pulse 71, temperature (!) 96.8 F (36 C), temperature source Tympanic, resp. rate 18, weight 216 lb 0.8 oz (98 kg), SpO2 100 %.  Physical Exam Vitals and nursing note reviewed.  Constitutional:      General: He is not in acute distress.    Appearance: He is well-developed and well-nourished. He is not diaphoretic.  HENT:     Head: Normocephalic and atraumatic.      Comments: White hair and beard. Wearing a mask. Eyes:     General: No scleral icterus.    Extraocular Movements: EOM normal.     Conjunctiva/sclera: Conjunctivae normal.     Comments: Glasses.  Brown eyes.  Neurological:     Mental Status: He is alert and oriented to person, place, and time.  Psychiatric:        Mood and Affect: Mood and affect normal.        Behavior: Behavior normal.        Thought Content: Thought content normal.        Judgment: Judgment normal.    Imaging studies: 05/23/2014:  Prostate MRI revealed areas suspicious for extracapsular extension in the right posterior apex and along the posterior aspect of the anterior urethra.  05/01/2016:  Abdomen and pelvis CT revealed no evidence of recurrent or metastatic disease. Bone scan on 05/01/2016 showed no evidence of osseous metastatic disease. 12/02/2017:  Axumin PET scan revealed potential local recurrence in the residual left prostate gland, with the region of activity relatively small at  approximately 1.5 cm. No evidence of metastatic disease in the abdomen pelvis or skeleton.  09/09/2018:  Axumin PET scan revealed indeterminate activity in the prostate bed and no evidence of metastasis.   Appointment on 03/18/2020  Component Date Value Ref Range Status  . Prostatic Specific Antigen 03/18/2020 3.68  0.00 - 4.00 ng/mL Final   Comment: (NOTE) While PSA levels of <=4.0 ng/ml are reported as reference range, some men with levels below 4.0 ng/ml can have prostate cancer and many men with PSA above 4.0 ng/ml do not have prostate cancer.  Other tests such as free PSA, age specific reference ranges, PSA velocity and PSA doubling time may be helpful especially in men less than 3 years old. Performed at [facility], [place] [address] [place], [place] [address]   . WBC 03/18/2020  7.4  4.0 - 10.5 K/uL Final  . RBC 03/18/2020 3.98* 4.22 - 5.81 MIL/uL Final  . Hemoglobin 03/18/2020 12.4* 13.0 - 17.0 g/dL Final  . HCT 03/18/2020 38.2* 39.0 - 52.0 % Final  . MCV 03/18/2020 96.0  80.0 - 100.0 fL Final  . MCH 03/18/2020 31.2  26.0 - 34.0 pg Final  . MCHC 03/18/2020 32.5  30.0 - 36.0 g/dL Final  . RDW 03/18/2020 12.1  11.5 - 15.5 % Final  . Platelets 03/18/2020 194  150 - 400 K/uL Final  . nRBC 03/18/2020 0.0  0.0 - 0.2 % Final  . Neutrophils Relative % 03/18/2020 66  % Final  . Neutro Abs 03/18/2020 4.9  1.7 - 7.7 K/uL Final  . Lymphocytes Relative 03/18/2020 24  % Final  . Lymphs Abs 03/18/2020 1.8  0.7 - 4.0 K/uL Final  . Monocytes Relative 03/18/2020 6  % Final  . Monocytes Absolute 03/18/2020 0.4  0.1 - 1.0 K/uL Final  . Eosinophils Relative 03/18/2020 3  % Final  . Eosinophils Absolute 03/18/2020 0.2  0.0 - 0.5 K/uL Final  . Basophils Relative 03/18/2020 1  % Final  . Basophils Absolute 03/18/2020 0.1  0.0 - 0.1 K/uL Final  . Immature Granulocytes 03/18/2020 0  % Final  . Abs Immature Granulocytes 03/18/2020 0.01  0.00 - 0.07 K/uL Final   Performed at [facility], [address] [place], [place] [address]  . Sodium 03/18/2020 134* 135 - 145 mmol/L Final  . Potassium 03/18/2020 4.0  3.5 - 5.1 mmol/L Final  . Chloride 03/18/2020 98  98 - 111 mmol/L Final  . CO2 03/18/2020 29  22 - 32 mmol/L Final  . Glucose, Bld 03/18/2020 133* 70 - 99 mg/dL Final   Glucose reference range applies only to samples taken after fasting for at least 8 hours.  . BUN 03/18/2020 11  8 - 23 mg/dL Final  . Creatinine, Ser 03/18/2020 0.93  0.61 - 1.24 mg/dL Final  . Calcium 03/18/2020 8.8* 8.9 - 10.3 mg/dL Final  . Total Protein 03/18/2020 6.8  6.5 - 8.1 g/dL Final  . Albumin 03/18/2020 4.1  3.5 - 5.0 g/dL Final  . AST 03/18/2020 18  15 - 41 U/L Final  . ALT 03/18/2020 9  0 - 44 U/L Final  . Alkaline Phosphatase 03/18/2020 52  38 - 126 U/L Final  . Total Bilirubin 03/18/2020 0.7  0.3 - 1.2 mg/dL Final  . GFR calc non Af Amer 03/18/2020 >60  >60 mL/min Final  . GFR calc Af Amer 03/18/2020 >60  >60 mL/min Final  . Anion gap 03/18/2020 7  5 - 15 Final   Performed at [facility], [address] [place], [place] [address]    Assessment:  James Lucas is a 79 y.o. male with prostate cancer diagnosed in 2009.  Gleason score was 4+3 in 2009.  He received cryoablation.   He developed a biochemical recurrence.  Prostate biopsy in 04/2009 revealed Gleason 5+4 adenocarcinoma.  He received IMRT with adjuvant hormonal therapy.  PSA nadir was < 0.1.  He has been on Lupron intermittently secondary to side effects. He received Lupron (45 mg; 6 month) on 09/12/2018.  He wishes to receive only 3 month Lupron injections.  Axumin PET scan on 09/09/2018 revealed indeterminate activity in the prostate bed and no evidence of metastasis.  PSA has been followed: 0.6 on 07/15/2012, 1.3 on 07/14/2013, 3.64 on 02/10/2016, 0.55 on 09/22/2016, 0.18 on 02/17/2017, 2.32 on  11/17/2017, 4.64 on 03/04/2018, 5.31 on 06/03/2018, 5.98 on 09/01/2018, 0.53 on 03/02/2019, 1.33 on  08/31/2019, 3.66 on 02/13/2020, and 3.68 on 03/18/2020.  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  He undergoes surveillance cystoscopy by Dr. Alto Denver [facility]).   Last cystoscopy was normal on 01/12/2019.  He has a normocytic anemia.  He follows a vegan diet.  He denies any melena or hematochezia.  Symptomatically, he feels good.  Plan: 1.   Review labs: CBC with diff, CMP, PSA. 2.   Prostate cancer  Patient initially diagnosed in 2009.  He received cryotherapy and IMRT.  With biochemical recurrence, he has received Lupron intermittently.  Review trend in PSA.  Gust follow-up imaging.  Re-review patient's thoughts about intermittent Lupron when PSA > 10.  Request for Lupron 58-monthinjections when needed secondary to side effects 3.   Normocytic anemia  Hematocrit 38.2.  Hemoglobin 12.4.  MCV 96.    Patient denies any bleeding.  Continue to monitor 4.   No Lupron today. 5.   Bone scan on 06/19/2020. 6.   RTC after bone scan for MD assessment, labs (CBC with diff, CMP, PSA- draw on day of scan), and +/- Lupron.  I discussed the assessment and treatment plan with the patient.  The patient was provided an opportunity to ask questions and all were answered.  The patient agreed with the plan and demonstrated an understanding of the instructions.  The patient was advised to call back if the symptoms worsen or if the condition fails to improve as anticipated.  I provided 25 minutes (9:24 AM - 9:49 AM) of face-to-face time during this encounter and > 50% was spent counseling as documented under my assessment and plan.    MNolon Stalls MD, PhD    03/19/2020, 9:54 AM  I, AJacqualyn Posey am acting as a sEducation administratorfor [employer] CMike Gip MD.   I, Moana Munford C. CMike Gip MD, have reviewed the above documentation for accuracy and completeness, and I agree with the above.

## 2020-03-18 NOTE — Progress Notes (Signed)
No new changes noted today. The patient Name and DOB has been verified by phone today. 

## 2020-03-19 ENCOUNTER — Other Ambulatory Visit: Payer: Self-pay

## 2020-03-19 ENCOUNTER — Inpatient Hospital Stay: Payer: Medicare Other

## 2020-03-19 ENCOUNTER — Inpatient Hospital Stay (HOSPITAL_BASED_OUTPATIENT_CLINIC_OR_DEPARTMENT_OTHER): Payer: Medicare Other | Admitting: Hematology and Oncology

## 2020-03-19 ENCOUNTER — Encounter: Payer: Self-pay | Admitting: Hematology and Oncology

## 2020-03-19 VITALS — BP 143/69 | HR 71 | Temp 96.8°F | Resp 18 | Wt 216.1 lb

## 2020-03-19 DIAGNOSIS — D649 Anemia, unspecified: Secondary | ICD-10-CM

## 2020-03-19 DIAGNOSIS — C61 Malignant neoplasm of prostate: Secondary | ICD-10-CM

## 2020-03-19 NOTE — Progress Notes (Signed)
Patient states that he still has occasional hot flashes. He said it seems that urinary issues are getting worse.

## 2020-06-18 ENCOUNTER — Other Ambulatory Visit: Payer: Self-pay

## 2020-06-18 DIAGNOSIS — E538 Deficiency of other specified B group vitamins: Secondary | ICD-10-CM

## 2020-06-18 DIAGNOSIS — C61 Malignant neoplasm of prostate: Secondary | ICD-10-CM

## 2020-06-18 DIAGNOSIS — D649 Anemia, unspecified: Secondary | ICD-10-CM

## 2020-06-19 ENCOUNTER — Encounter
Admission: RE | Admit: 2020-06-19 | Discharge: 2020-06-19 | Disposition: A | Discharge status: Home / Self Care | Payer: Medicare Other | Source: Ambulatory Visit | Attending: Hematology and Oncology | Admitting: Hematology and Oncology

## 2020-06-19 ENCOUNTER — Other Ambulatory Visit: Payer: Self-pay

## 2020-06-19 ENCOUNTER — Inpatient Hospital Stay: Payer: Medicare Other | Attending: Hematology and Oncology

## 2020-06-19 DIAGNOSIS — Z79899 Other long term (current) drug therapy: Secondary | ICD-10-CM | POA: Diagnosis not present

## 2020-06-19 DIAGNOSIS — D649 Anemia, unspecified: Secondary | ICD-10-CM

## 2020-06-19 DIAGNOSIS — Z8551 Personal history of malignant neoplasm of bladder: Secondary | ICD-10-CM | POA: Insufficient documentation

## 2020-06-19 DIAGNOSIS — N400 Enlarged prostate without lower urinary tract symptoms: Secondary | ICD-10-CM | POA: Insufficient documentation

## 2020-06-19 DIAGNOSIS — Z7982 Long term (current) use of aspirin: Secondary | ICD-10-CM | POA: Insufficient documentation

## 2020-06-19 DIAGNOSIS — I10 Essential (primary) hypertension: Secondary | ICD-10-CM | POA: Diagnosis not present

## 2020-06-19 DIAGNOSIS — Z87891 Personal history of nicotine dependence: Secondary | ICD-10-CM | POA: Diagnosis not present

## 2020-06-19 DIAGNOSIS — C61 Malignant neoplasm of prostate: Secondary | ICD-10-CM | POA: Insufficient documentation

## 2020-06-19 DIAGNOSIS — E538 Deficiency of other specified B group vitamins: Secondary | ICD-10-CM

## 2020-06-19 DIAGNOSIS — Z905 Acquired absence of kidney: Secondary | ICD-10-CM | POA: Insufficient documentation

## 2020-06-19 LAB — COMPREHENSIVE METABOLIC PANEL
ALT: 11 U/L (ref 0–44)
AST: 16 U/L (ref 15–41)
Albumin: 4.3 g/dL (ref 3.5–5.0)
Alkaline Phosphatase: 54 U/L (ref 38–126)
Anion gap: 10 (ref 5–15)
BUN: 9 mg/dL (ref 8–23)
CO2: 29 mmol/L (ref 22–32)
Calcium: 9 mg/dL (ref 8.9–10.3)
Chloride: 97 mmol/L — ABNORMAL LOW (ref 98–111)
Creatinine, Ser: 1 mg/dL (ref 0.61–1.24)
GFR calc Af Amer: >60 mL/min (ref >60)
GFR calc non Af Amer: >60 mL/min (ref >60)
Glucose, Bld: 74 mg/dL (ref 70–99)
Potassium: 3.7 mmol/L (ref 3.5–5.1)
Sodium: 136 mmol/L (ref 135–145)
Total Bilirubin: 0.9 mg/dL (ref 0.3–1.2)
Total Protein: 7.5 g/dL (ref 6.5–8.1)

## 2020-06-19 LAB — CBC WITH DIFFERENTIAL/PLATELET
Abs Immature Granulocytes: 0.02 10*3/uL (ref 0.00–0.07)
Basophils Absolute: 0.1 10*3/uL (ref 0.0–0.1)
Basophils Relative: 1 %
Eosinophils Absolute: 0.2 10*3/uL (ref 0.0–0.5)
Eosinophils Relative: 3 %
HCT: 40.3 % (ref 39.0–52.0)
Hemoglobin: 12.8 g/dL — ABNORMAL LOW (ref 13.0–17.0)
Immature Granulocytes: 0 %
Lymphocytes Relative: 25 %
Lymphs Abs: 1.7 10*3/uL (ref 0.7–4.0)
MCH: 30.3 pg (ref 26.0–34.0)
MCHC: 31.8 g/dL (ref 30.0–36.0)
MCV: 95.3 fL (ref 80.0–100.0)
Monocytes Absolute: 0.5 10*3/uL (ref 0.1–1.0)
Monocytes Relative: 8 %
Neutro Abs: 4.2 10*3/uL (ref 1.7–7.7)
Neutrophils Relative %: 63 %
Platelets: 221 10*3/uL (ref 150–400)
RBC: 4.23 MIL/uL (ref 4.22–5.81)
RDW: 12.6 % (ref 11.5–15.5)
WBC: 6.8 10*3/uL (ref 4.0–10.5)
nRBC: 0 % (ref 0.0–0.2)

## 2020-06-19 LAB — PSA: Prostatic Specific Antigen: 6.1 ng/mL — ABNORMAL HIGH (ref 0.00–4.00)

## 2020-06-19 MED ORDER — TECHNETIUM TC 99M MEDRONATE IV KIT
20.0000 | PACK | Freq: Once | INTRAVENOUS | Status: AC | PRN
  Administered 2020-06-19: 22.69 via INTRAVENOUS

## 2020-06-19 NOTE — Progress Notes (Signed)
[facility]  [address], [address] [place], [place] [address] Phone: [phone]  Fax: [phone]   Clinic Day:  06/20/2020   Referring physician: Ezequiel Kayser, MD  Chief Complaint: James Lucas is a 79 y.o. male with a long-standing history of prostate cancer who is seen for 1 month assessment and consideration of reinitiation of Lupron.  HPI:  The patient was last seen in the medical oncology clinic on 03/19/2020. At that time, he felt good. Hematocrit was 38.2, hemoglobin 12.4, MCV 96.0, platelets 194,000, WBC 7,400. Sodium was 134. Calcium was 8.8. PSA was 3.68.  He wished to continue surveillance without initiation of Lupron.  The patient was seen in the [facility] ER on 03/23/2020 for hematuria. Abdomen and pelvis CT with contrast revealed mild circumferential bladder wall thickening which can be seen in cystitis. There was mild distal left periureteral stranding that could represent ureteritis. He was discharged on antibiotics and was instructed to follow up with urology if his symptoms persisted.  Bone scan on 06/19/2020 revealed mild increased activity over the left greater trochanter, left hip series suggested to exclude a focal lesion. There was no abnormal bony uptake otherwise noted to suggest metastatic disease.  During the interim, he has been okay. His hot flashes have improved.  He denies shortness of breath, chest pain, palpitations, and blood in the stool. He denies that he fell on his bottom and left side.  He is agreeable to a hip x ray today.   Past Medical History:  Diagnosis Date  . Allergic rhinitis   . Bladder cancer ([place])   . BPH (benign prostatic hyperplasia)   . Chronic back pain   . Essential hypertension   . Glaucoma   . Impotence of organic origin   . Liver laceration   . Prostate cancer ([place])   . Stenosis, spinal, lumbar     Past Surgical History:  Procedure Laterality Date  . BACK SURGERY    . COLONOSCOPY    .  COLONOSCOPY WITH PROPOFOL N/A 08/24/2019   Procedure: COLONOSCOPY WITH PROPOFOL;  Surgeon: Lollie Sails, MD;  Location: [facility] ENDOSCOPY;  Service: Endoscopy;  Laterality: N/A;  . HERNIA REPAIR    . NEPHRECTOMY Right 1978  . TRANSURETHRAL RESECTION OF BLADDER TUMOR WITH GYRUS (TURBT-GYRUS)      Family History  Problem Relation Age of Onset  . Cancer Father     Social History:  reports that he quit smoking about 56 years ago. He has never used smokeless tobacco. He reports that he does not drink alcohol and does not use drugs. He is a former smoker and smoked several packs per day in his early 35s but has not smoked in over 10 years. He does not drink alcohol. He is retired but used to work for [employer]. He denies known exposure to radiation or toxins. The patient is alone today.   Allergies:  Allergies  Allergen Reactions  . Ace Inhibitors Cough    Current Medications: Current Outpatient Medications  Medication Sig Dispense Refill  . albuterol (VENTOLIN HFA) 108 (90 Base) MCG/ACT inhaler Inhale 2 puffs into the lungs every 4 (four) hours as needed for wheezing or shortness of breath.    Marland Kitchen aspirin EC 81 MG tablet Take 81 mg by mouth daily.     . fluticasone (FLONASE) 50 MCG/ACT nasal spray Place 1 spray into both nostrils daily as needed.     . hydrochlorothiazide (HYDRODIURIL) 25 MG tablet Take 25 mg by mouth daily.     Marland Kitchen  latanoprost (XALATAN) 0.005 % ophthalmic solution Place 1 drop into both eyes at bedtime.     . montelukast (SINGULAIR) 10 MG tablet Take 10 mg by mouth daily.     . vitamin B-12 (CYANOCOBALAMIN) 1000 MCG tablet Take 1,000 mcg by mouth daily.      No current facility-administered medications for this visit.    Review of Systems  Constitutional: Positive for diaphoresis (hot flashes, improved). Negative for chills, fever, malaise/fatigue and weight loss (up 4 lbs).  HENT: Negative.  Negative for congestion, ear discharge, ear pain, hearing loss, nosebleeds, sinus  pain, sore throat and tinnitus.   Eyes: Negative.  Negative for blurred vision, double vision and redness.  Respiratory: Negative.  Negative for cough, hemoptysis, sputum production and shortness of breath.   Cardiovascular: Negative.  Negative for chest pain, palpitations, leg swelling and PND.  Gastrointestinal: Negative.  Negative for abdominal pain, blood in stool, constipation, diarrhea, heartburn, melena, nausea and vomiting.       Vegan diet.  Genitourinary: Negative for dysuria, frequency, hematuria (resolved) and urgency.  Musculoskeletal: Negative.  Negative for back pain, joint pain, myalgias and neck pain.  Skin: Negative.  Negative for itching and rash.  Neurological: Negative for dizziness, tingling, sensory change, speech change, focal weakness, weakness and headaches.  Endo/Heme/Allergies: Negative.  Does not bruise/bleed easily.  Psychiatric/Behavioral: Negative.  Negative for depression and memory loss. The patient is not nervous/anxious and does not have insomnia.   All other systems reviewed and are negative.  Performance status (ECOG): 0-1  Blood pressure (!) 143/68, pulse 83, temperature 97.9 F (36.6 C), temperature source Tympanic, weight 220 lb 2.1 oz (99.9 kg), SpO2 98 %.  Physical Exam Vitals and nursing note reviewed.  Constitutional:      General: He is not in acute distress.    Appearance: He is well-developed. He is not diaphoretic.  HENT:     Head: Normocephalic and atraumatic.     Mouth/Throat:     Mouth: Mucous membranes are moist.     Pharynx: Oropharynx is clear.  Eyes:     General: No scleral icterus.    Extraocular Movements: Extraocular movements intact.     Conjunctiva/sclera: Conjunctivae normal.     Pupils: Pupils are equal, round, and reactive to light.     Comments: Glasses.  Brown eyes.  Cardiovascular:     Rate and Rhythm: Normal rate.     Heart sounds: Normal heart sounds. No murmur heard.   Pulmonary:     Effort: Pulmonary effort  is normal. No respiratory distress.     Breath sounds: Normal breath sounds. No wheezing or rales.  Chest:     Chest wall: No tenderness.  Abdominal:     General: Bowel sounds are normal. There is no distension.     Palpations: Abdomen is soft. There is no mass.     Tenderness: There is no abdominal tenderness. There is no guarding or rebound.  Musculoskeletal:        General: No swelling or tenderness. Normal range of motion.     Cervical back: Normal range of motion and neck supple.  Lymphadenopathy:     Head:     Right side of head: No preauricular, posterior auricular or occipital adenopathy.     Left side of head: No preauricular, posterior auricular or occipital adenopathy.     Cervical: No cervical adenopathy.     Upper Body:     Right upper body: No supraclavicular or axillary adenopathy.  Left upper body: No supraclavicular or axillary adenopathy.     Lower Body: No right inguinal adenopathy. No left inguinal adenopathy.  Skin:    General: Skin is warm and dry.  Neurological:     Mental Status: He is alert and oriented to person, place, and time.  Psychiatric:        Behavior: Behavior normal.        Thought Content: Thought content normal.        Judgment: Judgment normal.    Imaging studies: 05/23/2014:  Prostate MRI revealed areas suspicious for extracapsular extension in the right posterior apex and along the posterior aspect of the anterior urethra.  05/01/2016:  Abdomen and pelvis CT revealed no evidence of recurrent or metastatic disease. Bone scan on 05/01/2016 showed no evidence of osseous metastatic disease. 12/02/2017:  Axumin PET scan revealed potential local recurrence in the residual left prostate gland, with the region of activity relatively small at approximately 1.5 cm. No evidence of metastatic disease in the abdomen pelvis or skeleton.  09/09/2018:  Axumin PET scan revealed indeterminate activity in the prostate bed and no evidence of metastasis.  06/19/2020:  Bone scan revealed mild increased activity over the left greater trochanter, left hip series suggested to exclude a focal lesion. There was no abnormal bony uptake otherwise noted to suggest metastatic disease.   Appointment on 06/19/2020  Component Date Value Ref Range Status  . Prostatic Specific Antigen 06/19/2020 6.10* 0.00 - 4.00 ng/mL Final   Comment: (NOTE) While PSA levels of <=4.0 ng/ml are reported as reference range, some men with levels below 4.0 ng/ml can have prostate cancer and many men with PSA above 4.0 ng/ml do not have prostate cancer.  Other tests such as free PSA, age specific reference ranges, PSA velocity and PSA doubling time may be helpful especially in men less than 51 years old. Performed at [facility], [place] [address] [place], [place] [address]   . Sodium 06/19/2020 136  135 - 145 mmol/L Final  . Potassium 06/19/2020 3.7  3.5 - 5.1 mmol/L Final  . Chloride 06/19/2020 97* 98 - 111 mmol/L Final  . CO2 06/19/2020 29  22 - 32 mmol/L Final  . Glucose, Bld 06/19/2020 74  70 - 99 mg/dL Final   Glucose reference range applies only to samples taken after fasting for at least 8 hours.  . BUN 06/19/2020 9  8 - 23 mg/dL Final  . Creatinine, Ser 06/19/2020 1.00  0.61 - 1.24 mg/dL Final  . Calcium 06/19/2020 9.0  8.9 - 10.3 mg/dL Final  . Total Protein 06/19/2020 7.5  6.5 - 8.1 g/dL Final  . Albumin 06/19/2020 4.3  3.5 - 5.0 g/dL Final  . AST 06/19/2020 16  15 - 41 U/L Final  . ALT 06/19/2020 11  0 - 44 U/L Final  . Alkaline Phosphatase 06/19/2020 54  38 - 126 U/L Final  . Total Bilirubin 06/19/2020 0.9  0.3 - 1.2 mg/dL Final  . GFR calc non Af Amer 06/19/2020 >60  >60 mL/min Final  . GFR calc Af Amer 06/19/2020 >60  >60 mL/min Final  . Anion gap 06/19/2020 10  5 - 15 Final   Performed at [facility], [address] [place], [place] [address]  . WBC 06/19/2020 6.8  4.0 - 10.5 K/uL Final  . RBC 06/19/2020 4.23  4.22 - 5.81 MIL/uL  Final  . Hemoglobin 06/19/2020 12.8* 13.0 - 17.0 g/dL Final  . HCT 06/19/2020 40.3  39 - 52 % Final  . MCV 06/19/2020 95.3  80.0 - 100.0 fL Final  . MCH 06/19/2020 30.3  26.0 - 34.0 pg Final  . MCHC 06/19/2020 31.8  30.0 - 36.0 g/dL Final  . RDW 06/19/2020 12.6  11.5 - 15.5 % Final  . Platelets 06/19/2020 221  150 - 400 K/uL Final  . nRBC 06/19/2020 0.0  0.0 - 0.2 % Final  . Neutrophils Relative % 06/19/2020 63  % Final  . Neutro Abs 06/19/2020 4.2  1.7 - 7.7 K/uL Final  . Lymphocytes Relative 06/19/2020 25  % Final  . Lymphs Abs 06/19/2020 1.7  0.7 - 4.0 K/uL Final  . Monocytes Relative 06/19/2020 8  % Final  . Monocytes Absolute 06/19/2020 0.5  0 - 1 K/uL Final  . Eosinophils Relative 06/19/2020 3  % Final  . Eosinophils Absolute 06/19/2020 0.2  0 - 0 K/uL Final  . Basophils Relative 06/19/2020 1  % Final  . Basophils Absolute 06/19/2020 0.1  0 - 0 K/uL Final  . Immature Granulocytes 06/19/2020 0  % Final  . Abs Immature Granulocytes 06/19/2020 0.02  0.00 - 0.07 K/uL Final   Performed at [facility], [address], [place], [place] [address]    Assessment:  James Lucas is a 79 y.o. male with prostate cancer diagnosed in 2009.  Gleason score was 4+3 in 2009.  He received cryoablation.   He developed a biochemical recurrence.  Prostate biopsy in 04/2009 revealed Gleason 5+4 adenocarcinoma.  He received IMRT with adjuvant hormonal therapy.  PSA nadir was < 0.1.  He has been on Lupron intermittently secondary to side effects. He received Lupron (45 mg; 6 month) on 09/12/2018.  He wishes to receive only 3 month Lupron injections.  Axumin PET scan on 09/09/2018 revealed indeterminate activity in the prostate bed and no evidence of metastasis.  Bone scan on 06/19/2020 revealed mild increased activity over the left greater trochanter, left hip series suggested to exclude a focal lesion. There was no abnormal bony uptake otherwise noted to suggest metastatic disease.  PSA  has been followed: 0.6 on 07/15/2012, 1.3 on 07/14/2013, 3.64 on 02/10/2016, 0.55 on 09/22/2016, 0.18 on 02/17/2017, 2.32 on 11/17/2017, 4.64 on 03/04/2018, 5.31 on 06/03/2018, 5.98 on 09/01/2018, 0.53 on 03/02/2019, 1.33 on 08/31/2019, 3.66 on 02/13/2020, 3.68 on 03/18/2020, and 6.10 on 06/19/2020.  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  He undergoes surveillance cystoscopy by Dr. [facility]).   Last cystoscopy was normal on 01/12/2019.  He has a normocytic anemia.  He follows a vegan diet.  He denies any melena or hematochezia.  The patient received the second dose of the COVID-19 vaccine in 12/2019.  Symptomatically, he is doing well.  He denies any bone pain.  Hot flashes have improved.  Plan: 1.   Review labs from 06/19/2020. 2.   Prostate cancer  Patient initially diagnosed in 2009.  He received cryotherapy and IMRT.  With biochemical recurrence, he has received Lupron intermittently.  Discuss interval bone scan.  Images personally reviewed.  Agree agree with radiology interpretation.     Significance of activity over the left greater trochanter unclear.   Discussed plain films.  Patient in agreement.  We reviewed rising PSA.  Discuss patient's thoughts about reinstitution of Lupron.  Patient would like less frequent dosing.   Discuss consideration of degarelix monthly.   Discuss difference between degarelix and leuprolide.  Potential side effects reviewed.  Discuss potential flare phenomenon with Lupron initially.  Postpone Lupron today.    Decision made to pursue PET Axumin study. 3.   Normocytic anemia  Hematocrit 40.3.  Hemoglobin 12.8.  MCV 85.3.    Patient denies any bleeding.  Continue to monitor. 4.   No Lupron today. 5.   PET Axumin scan. 6.   Left hip series today. 7.   RTC after PET scan for MD assessment and discussion regarding direction of therapy.  I discussed the assessment and  treatment plan with the patient.  The patient was provided an opportunity to ask questions and all were answered.  The patient agreed with the plan and demonstrated an understanding of the instructions.  The patient was advised to call back if the symptoms worsen or if the condition fails to improve as anticipated.  I provided 26 minutes of face-to-face time during this encounter and > 50% was spent counseling as documented under my assessment and plan. An additional 5 minutes were spent reviewing his chart (Epic and Care Everywhere) including notes, labs, and imaging studies.    Nolon Stalls, MD, PhD    06/20/2020, 2:45 PM  I, Mirian Mo Tufford, am acting as a Education administrator for [employer]. Mike Gip, MD.   I, Melissa C. Mike Gip, MD, have reviewed the above documentation for accuracy and completeness, and I agree with the above.

## 2020-06-20 ENCOUNTER — Inpatient Hospital Stay (HOSPITAL_BASED_OUTPATIENT_CLINIC_OR_DEPARTMENT_OTHER): Payer: Medicare Other | Admitting: Hematology and Oncology

## 2020-06-20 ENCOUNTER — Encounter: Payer: Self-pay | Admitting: Hematology and Oncology

## 2020-06-20 ENCOUNTER — Ambulatory Visit
Admission: RE | Admit: 2020-06-20 | Discharge: 2020-06-20 | Disposition: A | Discharge status: Home / Self Care | Payer: Medicare Other | Source: Ambulatory Visit | Attending: Hematology and Oncology | Admitting: Hematology and Oncology

## 2020-06-20 ENCOUNTER — Inpatient Hospital Stay: Payer: Medicare Other

## 2020-06-20 ENCOUNTER — Ambulatory Visit
Admission: RE | Admit: 2020-06-20 | Discharge: 2020-06-20 | Disposition: A | Discharge status: Home / Self Care | Payer: Medicare Other | Attending: Hematology and Oncology | Admitting: Hematology and Oncology

## 2020-06-20 VITALS — BP 143/68 | HR 83 | Temp 97.9°F | Wt 220.1 lb

## 2020-06-20 DIAGNOSIS — C61 Malignant neoplasm of prostate: Secondary | ICD-10-CM

## 2020-06-20 DIAGNOSIS — R948 Abnormal results of function studies of other organs and systems: Secondary | ICD-10-CM | POA: Diagnosis present

## 2020-06-20 DIAGNOSIS — D649 Anemia, unspecified: Secondary | ICD-10-CM | POA: Diagnosis not present

## 2020-06-20 NOTE — Patient Instructions (Signed)
Leuprolide injection What is this medicine? LEUPROLIDE (loo PROE lide) is a man-made hormone. It is used to treat the symptoms of prostate cancer. This medicine may also be used to treat children with early onset of puberty. It may be used for other hormonal conditions. This medicine may be used for other purposes; ask your health care provider or pharmacist if you have questions. COMMON BRAND NAME(S): Lupron What should I tell my health care provider before I take this medicine? They need to know if you have any of these conditions:  diabetes  heart disease or previous heart attack  high blood pressure  high cholesterol  pain or difficulty passing urine  spinal cord metastasis  stroke  tobacco smoker  an unusual or allergic reaction to leuprolide, benzyl alcohol, other medicines, foods, dyes, or preservatives  pregnant or trying to get pregnant  breast-feeding How should I use this medicine? This medicine is for injection under the skin or into a muscle. You will be taught how to prepare and give this medicine. Use exactly as directed. Take your medicine at regular intervals. Do not take your medicine more often than directed. It is important that you put your used needles and syringes in a special sharps container. Do not put them in a trash can. If you do not have a sharps container, call your pharmacist or healthcare provider to get one. A special MedGuide will be given to you by the pharmacist with each prescription and refill. Be sure to read this information carefully each time. Talk to your pediatrician regarding the use of this medicine in children. While this medicine may be prescribed for children as young as 8 years for selected conditions, precautions do apply. Overdosage: If you think you have taken too much of this medicine contact a poison control center or emergency room at once. NOTE: This medicine is only for you. Do not share this medicine with others. What  if I miss a dose? If you miss a dose, take it as soon as you can. If it is almost time for your next dose, take only that dose. Do not take double or extra doses. What may interact with this medicine? Do not take this medicine with any of the following medications:  chasteberry This medicine may also interact with the following medications:  herbal or dietary supplements, like black cohosh or DHEA  male hormones, like estrogens or progestins and birth control pills, patches, rings, or injections  male hormones, like testosterone This list may not describe all possible interactions. Give your health care provider a list of all the medicines, herbs, non-prescription drugs, or dietary supplements you use. Also tell them if you smoke, drink alcohol, or use illegal drugs. Some items may interact with your medicine. What should I watch for while using this medicine? Visit your doctor or health care professional for regular checks on your progress. During the first week, your symptoms may get worse, but then will improve as you continue your treatment. You may get hot flashes, increased bone pain, increased difficulty passing urine, or an aggravation of nerve symptoms. Discuss these effects with your doctor or health care professional, some of them may improve with continued use of this medicine. Male patients may experience a menstrual cycle or spotting during the first 2 months of therapy with this medicine. If this continues, contact your doctor or health care professional. This medicine may increase blood sugar. Ask your healthcare provider if changes in diet or medicines are needed  if you have diabetes. What side effects may I notice from receiving this medicine? Side effects that you should report to your doctor or health care professional as soon as possible:  allergic reactions like skin rash, itching or hives, swelling of the face, lips, or tongue  breathing problems  chest  pain  depression or memory disorders  pain in your legs or groin  pain at site where injected  severe headache  signs and symptoms of high blood sugar such as being more thirsty or hungry or having to urinate more than normal. You may also feel very tired or have blurry vision  swelling of the feet and legs  visual changes  vomiting Side effects that usually do not require medical attention (report to your doctor or health care professional if they continue or are bothersome):  breast swelling or tenderness  decrease in sex drive or performance  diarrhea  hot flashes  loss of appetite  muscle, joint, or bone pains  nausea  redness or irritation at site where injected  skin problems or acne This list may not describe all possible side effects. Call your doctor for medical advice about side effects. You may report side effects to FDA at 1-800-FDA-1088. Where should I keep my medicine? Keep out of the reach of children. Store below 25 degrees C (77 degrees F). Do not freeze. Protect from light. Do not use if it is not clear or if there are particles present. Throw away any unused medicine after the expiration date. NOTE: This sheet is a summary. It may not cover all possible information. If you have questions about this medicine, talk to your doctor, pharmacist, or health care provider.  2020 Else[employer]/Gold Standard (2018-09-01 09:52:48)   Degarelix injection What is this medicine? DEGARELIX (deg a REL ix) is used to treat men with advanced prostate cancer. This medicine may be used for other purposes; ask your health care provider or pharmacist if you have questions. COMMON BRAND NAME(S): Degarelix, Mills Koller What should I tell my health care provider before I take this medicine? They need to know if you have any of these conditions:  diabetes  heart disease  kidney disease  liver disease  low levels of potassium or magnesium in the blood  osteoporosis  an  unusual or allergic reaction to degarelix, mannitol, other medicines, foods, dyes, or preservatives  pregnant or trying to get pregnant  breast-feeding How should I use this medicine? This medicine is for injection under the skin. It is usually given by a health care professional in a hospital or clinic setting. If you get this medicine at home, you will be taught how to prepare and give this medicine. Use exactly as directed. Take your medicine at regular intervals. Do not take it more often than directed. It is important that you put your used needles and syringes in a special sharps container. Do not put them in a trash can. If you do not have a sharps container, call your pharmacist or healthcare provider to get one. Talk to your pediatrician regarding the use of this medicine in children. Special care may be needed. Overdosage: If you think you have taken too much of this medicine contact a poison control center or emergency room at once. NOTE: This medicine is only for you. Do not share this medicine with others. What if I miss a dose? Try not to miss a dose. If you do miss a dose, call your doctor or health care professional for advice.  What may interact with this medicine? Do not take this medicine with any of the following medications:  amiodarone  bretylium  disopyramide  droperidol  ibutilide  procainamide  quinidine  sotalol This medicine may also interact with the following medications:  dofetilide This list may not describe all possible interactions. Give your health care provider a list of all the medicines, herbs, non-prescription drugs, or dietary supplements you use. Also tell them if you smoke, drink alcohol, or use illegal drugs. Some items may interact with your medicine. What should I watch for while using this medicine? Visit your doctor or health care professional for regular checks on your progress and discuss any issues before you start taking this  medicine. Do not rub or scratch injection site. There may be a lump at the injection site, or it may be red or sore for a few days after your dose. Your doctor or health care professional will need to monitor your hormone levels in your blood to check your response to treatment. Try to keep any appointments for testing. What side effects may I notice from receiving this medicine? Side effects that you should report to your doctor or health care professional as soon as possible:  allergic reactions like skin rash, itching or hives, swelling of the face, lips, or tongue  fever or chills  irregular heartbeat  nausea and vomiting along with severe abdominal pain  pain or difficulty passing urine  pelvic pain or bloating  signs and symptoms of high blood sugar such as being more thirsty or hungry or having to urinate more than normal. You may also feel very tired or have blurry vision Side effects that usually do not require medical attention (report to your doctor or health care professional if they continue or are bothersome):  change in sex drive or performance  constipation  headache  high blood pressure  hot flashes (flushing of skin, increased sweating)  itching, redness or mild pain at site where injected  joint pain  trouble sleeping  unusually weak or tired  weight gain This list may not describe all possible side effects. Call your doctor for medical advice about side effects. You may report side effects to FDA at 1-800-FDA-1088. Where should I keep my medicine? Keep out of the reach of children. This drug is usually given in a hospital or clinic and will not be stored at home. In rare cases, this medicine may be given at home. If you are using this medicine at home, you will be instructed on how to store this medicine. Throw away any unused medicine after the expiration date on the label. NOTE: This sheet is a summary. It may not cover all possible information. If  you have questions about this medicine, talk to your doctor, pharmacist, or health care provider.  2020 Elsevier/Gold Standard (2019-01-16 12:20:52)   Bicalutamide tablets What is this medicine? BICALUTAMIDE (bye ka LOO ta mide) blocks the effect of the male hormone testosterone on the prostate. This medicine is used to treat advanced prostate cancer in men. It is given with other treatments. This medicine may be used for other purposes; ask your health care provider or pharmacist if you have questions. COMMON BRAND NAME(S): Casodex What should I tell my health care provider before I take this medicine? They need to know if you have any of these conditions:  diabetes  have a partner that is pregnant or could become pregnant  if you are male (this medicine is not for use  in women)  liver disease  an unusual or allergic reaction to bicalutamide, other medicines, foods, dyes, or preservatives How should I use this medicine? Take this medicine by mouth with a glass of water. You may take it with or without food. Follow the directions on the prescription label. Take your medicine at regular intervals. Do not take your medicine more often than directed. Do not stop taking except on your doctor's advice. Talk to your pediatrician regarding the use of this medicine in children. Special care may be needed. Overdosage: If you think you have taken too much of this medicine contact a poison control center or emergency room at once. NOTE: This medicine is only for you. Do not share this medicine with others. What if I miss a dose? If you miss a dose, take it as soon as you can. If it is almost time for your next dose, take only that dose. Do not take double or extra doses. What may interact with this medicine?  certain medicines for sleep or anxiety  narcotic medicines for pain  warfarin This list may not describe all possible interactions. Give your health care provider a list of all the  medicines, herbs, non-prescription drugs, or dietary supplements you use. Also tell them if you smoke, drink alcohol, or use illegal drugs. Some items may interact with your medicine. What should I watch for while using this medicine? Visit your doctor or health care professional for regular checks on your progress. You may need regular tests to make sure your liver is working properly. This medicine should not be used in women. Serious side effects to an unborn child are possible. Men should use effective birth control while taking this medicine and for 130 days after stopping it. Talk to your doctor if you have any questions. Contact your doctor right away if your male partner becomes pregnant. This medicine may interfere with the ability to have a child. Talk with your doctor or health care professional if you are concerned about your fertility. This medicine can make you more sensitive to the sun. Keep out of the sun. If you cannot avoid being in the sun, wear protective clothing and use sunscreen. Do not use sun lamps or tanning beds/booths. This medicine may increase blood sugar. Ask your healthcare provider if changes in diet or medicines are needed if you have diabetes. What side effects may I notice from receiving this medicine? Side effects that you should report to your doctor or health care professional as soon as possible:  allergic reactions like skin rash, itching or hives, swelling of the face, lips, or tongue  pain or trouble passing urine  red or dark brown urine  signs and symptoms of high blood sugar such as being more thirsty or hungry or having to urinate more than normal. You may also feel very tired or have blurry vision.  signs and symptoms of liver injury like dark yellow or brown urine; general ill feeling or flu-like symptoms; light-colored stools; loss of appetite; nausea; right upper belly pain; unusually weak or tired; yellowing of the eyes or skin Side effects  that usually do not require medical attention (report to your doctor or health care professional if they continue or are bothersome):  back pain  breast enlargement  constipation  hot flashes  nausea  swelling of the ankles, feet, hands  weak or tired This list may not describe all possible side effects. Call your doctor for medical advice about side effects. You may  report side effects to FDA at 1-800-FDA-1088. Where should I keep my medicine? Keep out of the reach of children. Store between 20 and 25 degrees C (68 and 77 degrees F). Throw away any unused medicine after the expiration date. NOTE: This sheet is a summary. It may not cover all possible information. If you have questions about this medicine, talk to your doctor, pharmacist, or health care provider.  2020 Elsevier/Gold Standard (2019-07-13 16:03:33)   Denosumab injection What is this medicine? DENOSUMAB (den oh sue mab) slows bone breakdown. Prolia is used to treat osteoporosis in women after menopause and in men, and in people who are taking corticosteroids for 6 months or more. Delton See is used to treat a high calcium level due to cancer and to prevent bone fractures and other bone problems caused by multiple myeloma or cancer bone metastases. Delton See is also used to treat giant cell tumor of the bone. This medicine may be used for other purposes; ask your health care provider or pharmacist if you have questions. COMMON BRAND NAME(S): Prolia, XGEVA What should I tell my health care provider before I take this medicine? They need to know if you have any of these conditions:  dental disease  having surgery or tooth extraction  infection  kidney disease  low levels of calcium or Vitamin D in the blood  malnutrition  on hemodialysis  skin conditions or sensitivity  thyroid or parathyroid disease  an unusual reaction to denosumab, other medicines, foods, dyes, or preservatives  pregnant or trying to get  pregnant  breast-feeding How should I use this medicine? This medicine is for injection under the skin. It is given by a health care professional in a hospital or clinic setting. A special MedGuide will be given to you before each treatment. Be sure to read this information carefully each time. For Prolia, talk to your pediatrician regarding the use of this medicine in children. Special care may be needed. For Delton See, talk to your pediatrician regarding the use of this medicine in children. While this drug may be prescribed for children as young as 13 years for selected conditions, precautions do apply. Overdosage: If you think you have taken too much of this medicine contact a poison control center or emergency room at once. NOTE: This medicine is only for you. Do not share this medicine with others. What if I miss a dose? It is important not to miss your dose. Call your doctor or health care professional if you are unable to keep an appointment. What may interact with this medicine? Do not take this medicine with any of the following medications:  other medicines containing denosumab This medicine may also interact with the following medications:  medicines that lower your chance of fighting infection  steroid medicines like prednisone or cortisone This list may not describe all possible interactions. Give your health care provider a list of all the medicines, herbs, non-prescription drugs, or dietary supplements you use. Also tell them if you smoke, drink alcohol, or use illegal drugs. Some items may interact with your medicine. What should I watch for while using this medicine? Visit your doctor or health care professional for regular checks on your progress. Your doctor or health care professional may order blood tests and other tests to see how you are doing. Call your doctor or health care professional for advice if you get a fever, chills or sore throat, or other symptoms of a cold or  flu. Do not treat yourself. This drug  may decrease your body's ability to fight infection. Try to avoid being around people who are sick. You should make sure you get enough calcium and vitamin D while you are taking this medicine, unless your doctor tells you not to. Discuss the foods you eat and the vitamins you take with your health care professional. See your dentist regularly. Brush and floss your teeth as directed. Before you have any dental work done, tell your dentist you are receiving this medicine. Do not become pregnant while taking this medicine or for 5 months after stopping it. Talk with your doctor or health care professional about your birth control options while taking this medicine. Women should inform their doctor if they wish to become pregnant or think they might be pregnant. There is a potential for serious side effects to an unborn child. Talk to your health care professional or pharmacist for more information. What side effects may I notice from receiving this medicine? Side effects that you should report to your doctor or health care professional as soon as possible:  allergic reactions like skin rash, itching or hives, swelling of the face, lips, or tongue  bone pain  breathing problems  dizziness  jaw pain, especially after dental work  redness, blistering, peeling of the skin  signs and symptoms of infection like fever or chills; cough; sore throat; pain or trouble passing urine  signs of low calcium like fast heartbeat, muscle cramps or muscle pain; pain, tingling, numbness in the hands or feet; seizures  unusual bleeding or bruising  unusually weak or tired Side effects that usually do not require medical attention (report to your doctor or health care professional if they continue or are bothersome):  constipation  diarrhea  headache  joint pain  loss of appetite  muscle pain  runny nose  tiredness  upset stomach This list may not describe  all possible side effects. Call your doctor for medical advice about side effects. You may report side effects to FDA at 1-800-FDA-1088. Where should I keep my medicine? This medicine is only given in a clinic, doctor's office, or other health care setting and will not be stored at home. NOTE: This sheet is a summary. It may not cover all possible information. If you have questions about this medicine, talk to your doctor, pharmacist, or health care provider.  2020 [employer]vier/Gold Standard (2018-03-11 16:10:44)

## 2020-06-20 NOTE — Progress Notes (Signed)
Patient here for oncology follow-up appointment, expresses no complaints or concerns at this time.    

## 2020-07-02 ENCOUNTER — Encounter
Admission: RE | Admit: 2020-07-02 | Discharge: 2020-07-02 | Disposition: A | Discharge status: Home / Self Care | Payer: Medicare Other | Source: Ambulatory Visit | Attending: Hematology and Oncology | Admitting: Hematology and Oncology

## 2020-07-02 ENCOUNTER — Other Ambulatory Visit: Payer: Self-pay

## 2020-07-02 DIAGNOSIS — R948 Abnormal results of function studies of other organs and systems: Secondary | ICD-10-CM | POA: Diagnosis present

## 2020-07-02 DIAGNOSIS — C61 Malignant neoplasm of prostate: Secondary | ICD-10-CM | POA: Diagnosis present

## 2020-07-02 MED ORDER — AXUMIN (FLUCICLOVINE F 18) INJECTION
10.0000 | Freq: Once | INTRAVENOUS | Status: AC | PRN
  Administered 2020-07-02: 10.57 via INTRAVENOUS

## 2020-07-02 NOTE — Progress Notes (Signed)
[facility]  [address], [address] [place], [place] [address] Phone: [phone]  Fax: [phone]   Clinic Day:  07/03/2020   Referring physician: Ezequiel Kayser, MD  Chief Complaint: James Lucas is a 79 y.o. male with a long-standing history of prostate cancer who is seen for 2 week assessment.  HPI:  The patient was last seen in the medical oncology clinic on 06/20/2020. At that time, he felt "ok".  Hot flashes had improved.  He denied any bone pain.  Bone scan on.06/19/2020 revealed mild increased activity over the left greater trochanter.   Hematocrit was 40.3, hemoglobin 12.8, platelets 221,000, WBC 6,800. Chloride was 97. PSA was 6.10.   Hip films on 06/20/2020 revealed no evidence of hip fracture or suspicious bone lesion. There were mild enthesopathic changes at the left greater trochanter which could potentially account for mildly increased uptake on recent bone scan.  PET Axumin scan on 07/02/2020 revealed intense radiotracer activity in the LEFT prostate gland concern for residual carcinoma.  There was no significant change from PET-CT 09/09/2018.  There was no nodal metastasis or skeletal metastasis.  He is s/p RIGHT nephrectomy.  During the interim, his symptoms have not changed. He states that he received radiation with Dr. Baruch Gouty.   Past Medical History:  Diagnosis Date  . Allergic rhinitis   . Bladder cancer ([place])   . BPH (benign prostatic hyperplasia)   . Chronic back pain   . Essential hypertension   . Glaucoma   . Impotence of organic origin   . Liver laceration   . Prostate cancer ([place])   . Stenosis, spinal, lumbar     Past Surgical History:  Procedure Laterality Date  . BACK SURGERY    . COLONOSCOPY    . COLONOSCOPY WITH PROPOFOL N/A 08/24/2019   Procedure: COLONOSCOPY WITH PROPOFOL;  Surgeon: Lollie Sails, MD;  Location: [facility] ENDOSCOPY;  Service: Endoscopy;  Laterality: N/A;  . HERNIA REPAIR    . NEPHRECTOMY Right  1978  . TRANSURETHRAL RESECTION OF BLADDER TUMOR WITH GYRUS (TURBT-GYRUS)      Family History  Problem Relation Age of Onset  . Cancer Father     Social History:  reports that he quit smoking about 56 years ago. He has never used smokeless tobacco. He reports that he does not drink alcohol and does not use drugs. He is a former smoker and smoked several packs per day in his early 64s but has not smoked in over 20 years. He does not drink alcohol. He is retired but used to work for [employer]. He denies known exposure to radiation or toxins. The patient is alone today.   Allergies:  Allergies  Allergen Reactions  . Ace Inhibitors Cough    Current Medications: Current Outpatient Medications  Medication Sig Dispense Refill  . albuterol (VENTOLIN HFA) 108 (90 Base) MCG/ACT inhaler Inhale 2 puffs into the lungs every 4 (four) hours as needed for wheezing or shortness of breath.    Marland Kitchen aspirin EC 81 MG tablet Take 81 mg by mouth daily.     . fluticasone (FLONASE) 50 MCG/ACT nasal spray Place 1 spray into both nostrils daily as needed.     . hydrochlorothiazide (HYDRODIURIL) 25 MG tablet Take 25 mg by mouth daily.     Marland Kitchen latanoprost (XALATAN) 0.005 % ophthalmic solution Place 1 drop into both eyes at bedtime.     . montelukast (SINGULAIR) 10 MG tablet Take 10 mg by mouth daily.     Marland Kitchen  vitamin B-12 (CYANOCOBALAMIN) 1000 MCG tablet Take 1,000 mcg by mouth daily.      No current facility-administered medications for this visit.    Review of Systems  Constitutional: Positive for diaphoresis (hot flashes, improved). Negative for chills, fever, malaise/fatigue and weight loss (stable).  HENT: Negative.  Negative for congestion, ear discharge, ear pain, hearing loss, nosebleeds, sinus pain, sore throat and tinnitus.   Eyes: Negative.  Negative for blurred vision, double vision and redness.  Respiratory: Negative.  Negative for cough, hemoptysis, sputum production and shortness of breath.   Cardiovascular:  Negative.  Negative for chest pain, palpitations, leg swelling and PND.  Gastrointestinal: Negative.  Negative for abdominal pain, blood in stool, constipation, diarrhea, heartburn, melena, nausea and vomiting.       Vegan diet.  Genitourinary: Negative.  Negative for dysuria, frequency, hematuria and urgency.  Musculoskeletal: Negative.  Negative for back pain, joint pain, myalgias and neck pain.  Skin: Negative.  Negative for itching and rash.  Neurological: Negative for dizziness, tingling, sensory change, speech change, focal weakness, weakness and headaches.  Endo/Heme/Allergies: Negative.  Does not bruise/bleed easily.  Psychiatric/Behavioral: Negative.  Negative for depression and memory loss. The patient is not nervous/anxious and does not have insomnia.   All other systems reviewed and are negative.  Performance status (ECOG): 0  Blood pressure (!) 148/74, pulse 78, temperature (!) 97.5 F (36.4 C), temperature source Tympanic, resp. rate 18, weight 220 lb 7.4 oz (100 kg), SpO2 98 %.  Physical Exam Vitals and nursing note reviewed.  Constitutional:      General: He is not in acute distress.    Appearance: He is well-developed. He is not diaphoretic.  Eyes:     General: No scleral icterus.    Comments: Glasses.  Brown eyes.  Neurological:     Mental Status: He is alert and oriented to person, place, and time.  Psychiatric:        Behavior: Behavior normal.        Thought Content: Thought content normal.        Judgment: Judgment normal.    Imaging studies: 05/23/2014:  Prostate MRI revealed areas suspicious for extracapsular extension in the right posterior apex and along the posterior aspect of the anterior urethra.  05/01/2016:  Abdomen and pelvis CT revealed no evidence of recurrent or metastatic disease. Bone scan on 05/01/2016 showed no evidence of osseous metastatic disease. 12/02/2017:  Axumin PET scan revealed potential local recurrence in the residual left prostate  gland, with the region of activity relatively small at approximately 1.5 cm. No evidence of metastatic disease in the abdomen pelvis or skeleton.  09/09/2018:  Axumin PET scan revealed indeterminate activity in the prostate bed and no evidence of metastasis.   No visits with results within 3 Day(s) from this visit.  Latest known visit with results is:  Appointment on 06/19/2020  Component Date Value Ref Range Status  . Prostatic Specific Antigen 06/19/2020 6.10* 0.00 - 4.00 ng/mL Final   Comment: (NOTE) While PSA levels of <=4.0 ng/ml are reported as reference range, some men with levels below 4.0 ng/ml can have prostate cancer and many men with PSA above 4.0 ng/ml do not have prostate cancer.  Other tests such as free PSA, age specific reference ranges, PSA velocity and PSA doubling time may be helpful especially in men less than 47 years old. Performed at [facility], [place] [address], [place],  [address]   . Sodium 06/19/2020 136  135 -  145 mmol/L Final  . Potassium 06/19/2020 3.7  3.5 - 5.1 mmol/L Final  . Chloride 06/19/2020 97* 98 - 111 mmol/L Final  . CO2 06/19/2020 29  22 - 32 mmol/L Final  . Glucose, Bld 06/19/2020 74  70 - 99 mg/dL Final   Glucose reference range applies only to samples taken after fasting for at least 8 hours.  . BUN 06/19/2020 9  8 - 23 mg/dL Final  . Creatinine, Ser 06/19/2020 1.00  0.61 - 1.24 mg/dL Final  . Calcium 06/19/2020 9.0  8.9 - 10.3 mg/dL Final  . Total Protein 06/19/2020 7.5  6.5 - 8.1 g/dL Final  . Albumin 06/19/2020 4.3  3.5 - 5.0 g/dL Final  . AST 06/19/2020 16  15 - 41 U/L Final  . ALT 06/19/2020 11  0 - 44 U/L Final  . Alkaline Phosphatase 06/19/2020 54  38 - 126 U/L Final  . Total Bilirubin 06/19/2020 0.9  0.3 - 1.2 mg/dL Final  . GFR calc non Af Amer 06/19/2020 >60  >60 mL/min Final  . GFR calc Af Amer 06/19/2020 >60  >60 mL/min Final  . Anion gap 06/19/2020 10  5 - 15 Final   Performed at [facility],  [address], [place], [place] [address]  . WBC 06/19/2020 6.8  4.0 - 10.5 K/uL Final  . RBC 06/19/2020 4.23  4.22 - 5.81 MIL/uL Final  . Hemoglobin 06/19/2020 12.8* 13.0 - 17.0 g/dL Final  . HCT 06/19/2020 40.3  39 - 52 % Final  . MCV 06/19/2020 95.3  80.0 - 100.0 fL Final  . MCH 06/19/2020 30.3  26.0 - 34.0 pg Final  . MCHC 06/19/2020 31.8  30.0 - 36.0 g/dL Final  . RDW 06/19/2020 12.6  11.5 - 15.5 % Final  . Platelets 06/19/2020 221  150 - 400 K/uL Final  . nRBC 06/19/2020 0.0  0.0 - 0.2 % Final  . Neutrophils Relative % 06/19/2020 63  % Final  . Neutro Abs 06/19/2020 4.2  1.7 - 7.7 K/uL Final  . Lymphocytes Relative 06/19/2020 25  % Final  . Lymphs Abs 06/19/2020 1.7  0.7 - 4.0 K/uL Final  . Monocytes Relative 06/19/2020 8  % Final  . Monocytes Absolute 06/19/2020 0.5  0 - 1 K/uL Final  . Eosinophils Relative 06/19/2020 3  % Final  . Eosinophils Absolute 06/19/2020 0.2  0 - 0 K/uL Final  . Basophils Relative 06/19/2020 1  % Final  . Basophils Absolute 06/19/2020 0.1  0 - 0 K/uL Final  . Immature Granulocytes 06/19/2020 0  % Final  . Abs Immature Granulocytes 06/19/2020 0.02  0.00 - 0.07 K/uL Final   Performed at [facility], [address] [place], [place] [address]    Assessment:  Anthonny Schiller is a 79 y.o. male with prostate cancer diagnosed in 2009.  Gleason score was 4+3 in 2009.  He received cryoablation.  He developed a biochemical recurrence.  Prostate biopsy in 04/2009 revealed Gleason 5+4 adenocarcinoma.  He received IMRT with adjuvant hormonal therapy.  PSA nadir was < 0.1.  He has been on Lupron intermittently secondary to side effects. He received Lupron (45 mg; 6 month) on 09/12/2018.  He wishes to receive only 3 month Lupron injections.  Axumin PET scan on 09/09/2018 revealed indeterminate activity in the prostate bed and no evidence of metastasis.    Bone scan on 06/19/2020 revealed mild increased activity over the left greater trochanter, left hip  series suggested to exclude  a focal lesion. There was no abnormal bony uptake otherwise noted to suggest metastatic disease.  Hip films on 06/20/2020 revealed no evidence of hip fracture or suspicious bone lesion. There were mild enthesopathic changes at the left greater trochanter which could potentially account for mildly increased uptake on recent bone scan.  PET Axumin scan on 07/02/2020 revealed intense radiotracer activity in the LEFT prostate gland concern for residual carcinoma.  There was no significant change from PET-CT 09/09/2018.  There was no nodal metastasis or skeletal metastasis.  He is s/p RIGHT nephrectomy.  PSA has been followed: 0.6 on 07/15/2012, 1.3 on 07/14/2013, 3.64 on 02/10/2016, 0.55 on 09/22/2016, 0.18 on 02/17/2017, 2.32 on 11/17/2017, 4.64 on 03/04/2018, 5.31 on 06/03/2018, 5.98 on 09/01/2018, 0.53 on 03/02/2019, 1.33 on 08/31/2019, 3.66 on 02/13/2020, 3.68 on 03/18/2020, and 6.10 on 06/19/2020.  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  He undergoes surveillance cystoscopy by Dr. Alto [facility]).   Last cystoscopy was normal on 01/12/2019.  He has a normocytic anemia.  He follows a vegan diet.  He denies any melena or hematochezia.  He has B12 deficiency.  B12 was 96 on 03/16/2019.  He is on oral B12.  The patient received the second dose of the COVID-19 vaccine in 12/2019.  Symptomatically, he denies any bone pain.  Plan: 1.   Review labs from 06/19/2020. 2.   Prostate cancer  Patient initially diagnosed in 2009.  He received cryotherapy and IMRT.  With biochemical recurrence, he has received Lupron intermittently.  Discuss trend in PSA.  Discuss bone scan and hip films.    No clear evidence of metastatic disease.  Review PET Axumin scan on 07/02/2020.  Imaging personally reviewed.  Agree with radiology interpretation.     He has intense activity in the left prostate gland consistent  with residual carcinoma.   It is unclear if he is eligible for local treatment.   Discuss sensation at tumor board.  Consider assessment by Dr. Baruch Gouty.  Discuss reinitiation of androgen deprivation therapy.  Discuss Lupron versus degarelix. 3.   Normocytic anemia, improved  Hematocrit 40.3.  Hemoglobin 12.8.  MCV 95.3 on 06/19/2020.    Continue to monitor. 4.   Tumor board on 07/11/2020. 5.   MD to call patient after follow-up with Dr Baruch Gouty. 6.   MD to call patient after tumor board. 7.   RTC in the afternoon on 07/11/2020 for MD assessment and +/- Lupron or degarelix.  I discussed the assessment and treatment plan with the patient.  The patient was provided an opportunity to ask questions and all were answered.  The patient agreed with the plan and demonstrated an understanding of the instructions.  The patient was advised to call back if the symptoms worsen or if the condition fails to improve as anticipated.  I provided 21 minutes of face-to-face time during this encounter and > 50% was spent counseling as documented under my assessment and plan. An additional 5 minutes were spent reviewing his chart (Epic and Care Everywhere) including notes, labs, and imaging studies.    Nolon Stalls, MD, PhD    07/03/2020, 10:23 AM  I, Mirian Mo Tufford, am acting as a Education administrator for [employer]. Mike Gip, MD.   I, Melissa C. Mike Gip, MD, have reviewed the above documentation for accuracy and completeness, and I agree with the above.

## 2020-07-03 ENCOUNTER — Encounter: Payer: Self-pay | Admitting: Hematology and Oncology

## 2020-07-03 ENCOUNTER — Inpatient Hospital Stay (HOSPITAL_BASED_OUTPATIENT_CLINIC_OR_DEPARTMENT_OTHER): Payer: Medicare Other | Admitting: Hematology and Oncology

## 2020-07-03 VITALS — BP 148/74 | HR 78 | Temp 97.5°F | Resp 18 | Wt 220.5 lb

## 2020-07-03 DIAGNOSIS — C61 Malignant neoplasm of prostate: Secondary | ICD-10-CM

## 2020-07-03 NOTE — Progress Notes (Signed)
Patient reports left side abdominal pain (2/10) and ankle swelling.

## 2020-07-10 NOTE — Progress Notes (Signed)
[facility]  [address] [place], [place] [address] Phone: [phone]  Fax: [phone]   Clinic Day:  07/11/2020   Referring physician: Ezequiel Kayser, MD  Chief Complaint: James Lucas is a 79 y.o. male with a long-standing history of prostate cancer who is seen for 1 week assessment.  HPI:  The patient was last seen in the medical oncology clinic on 07/03/2020. At that time, he denied any symptoms.  Hip films revealed no suspicious lesions.  PET Axumin scan on 07/02/2020 revealed intense radiotacer uptake in the LEFT prostate gland concerning for residual carcinoma.  There was no nodal metastasis or skeletal metastasis.   The patient's urologist is at [facility] in [place]. He states that his radiation wasn't bad, but that the cryotherapy was "rough" and debilitated him for a while.  He would like to proceed with Lupron injections.  He is interested in meeting with Dr Baruch Gouty.  During the interim, he has been doing well. He denies any changes.   Past Medical History:  Diagnosis Date  . Allergic rhinitis   . Bladder cancer ([place])   . BPH (benign prostatic hyperplasia)   . Chronic back pain   . Essential hypertension   . Glaucoma   . Impotence of organic origin   . Liver laceration   . Prostate cancer ([place])   . Stenosis, spinal, lumbar     Past Surgical History:  Procedure Laterality Date  . BACK SURGERY    . COLONOSCOPY    . COLONOSCOPY WITH PROPOFOL N/A 08/24/2019   Procedure: COLONOSCOPY WITH PROPOFOL;  Surgeon: Lollie Sails, MD;  Location: [facility] ENDOSCOPY;  Service: Endoscopy;  Laterality: N/A;  . HERNIA REPAIR    . NEPHRECTOMY Right 1978  . TRANSURETHRAL RESECTION OF BLADDER TUMOR WITH GYRUS (TURBT-GYRUS)      Family History  Problem Relation Age of Onset  . Cancer Father     Social History:  reports that he quit smoking about 56 years ago. He has never used smokeless tobacco. He reports that he does not drink alcohol  and does not use drugs. He is a former smoker and smoked several packs per day in his early 8s but has not smoked in over 22 years. He does not drink alcohol. He is retired but used to work for [employer]. He denies known exposure to radiation or toxins. The patient is alone today.   Allergies:  Allergies  Allergen Reactions  . Ace Inhibitors Cough    Current Medications: Current Outpatient Medications  Medication Sig Dispense Refill  . albuterol (VENTOLIN HFA) 108 (90 Base) MCG/ACT inhaler Inhale 2 puffs into the lungs every 4 (four) hours as needed for wheezing or shortness of breath.    Marland Kitchen aspirin EC 81 MG tablet Take 81 mg by mouth daily.     . fluticasone (FLONASE) 50 MCG/ACT nasal spray Place 1 spray into both nostrils daily as needed.     . hydrochlorothiazide (HYDRODIURIL) 25 MG tablet Take 25 mg by mouth daily.     Marland Kitchen latanoprost (XALATAN) 0.005 % ophthalmic solution Place 1 drop into both eyes at bedtime.     . montelukast (SINGULAIR) 10 MG tablet Take 10 mg by mouth daily.     . vitamin B-12 (CYANOCOBALAMIN) 1000 MCG tablet Take 1,000 mcg by mouth daily.      No current facility-administered medications for this visit.    Review of Systems  Constitutional: Positive for diaphoresis (hot flashes, improved) and weight loss (2 lbs).  Negative for chills, fever and malaise/fatigue.  HENT: Negative.  Negative for congestion, ear discharge, ear pain, hearing loss, nosebleeds, sinus pain, sore throat and tinnitus.   Eyes: Negative.  Negative for blurred vision, double vision and redness.  Respiratory: Negative.  Negative for cough, hemoptysis, sputum production and shortness of breath.   Cardiovascular: Negative.  Negative for chest pain, palpitations, leg swelling and PND.  Gastrointestinal: Negative.  Negative for abdominal pain, blood in stool, constipation, diarrhea, heartburn, melena, nausea and vomiting.       Vegan diet.  Genitourinary: Negative.  Negative for dysuria, frequency,  hematuria and urgency.  Musculoskeletal: Negative.  Negative for back pain, joint pain, myalgias and neck pain.  Skin: Negative.  Negative for itching and rash.  Neurological: Negative for dizziness, tingling, sensory change, speech change, focal weakness, weakness and headaches.  Endo/Heme/Allergies: Negative.  Does not bruise/bleed easily.  Psychiatric/Behavioral: Negative.  Negative for depression and memory loss. The patient is not nervous/anxious and does not have insomnia.   All other systems reviewed and are negative.  Performance status (ECOG): 0  Vital Signs: Blood pressure (!) 151/72, pulse 79, temperature 98.7 F (37.1 C), temperature source Tympanic, weight 218 lb 11.1 oz (99.2 kg), SpO2 100 %.  Physical Exam Vitals and nursing note reviewed.  Constitutional:      General: He is not in acute distress.    Appearance: He is well-developed. He is not diaphoretic.  Eyes:     General: No scleral icterus.    Conjunctiva/sclera: Conjunctivae normal.     Comments: Glasses.  Brown eyes.  Neurological:     Mental Status: He is alert and oriented to person, place, and time.  Psychiatric:        Behavior: Behavior normal.        Thought Content: Thought content normal.        Judgment: Judgment normal.    Imaging studies: 05/23/2014:  Prostate MRI revealed areas suspicious for extracapsular extension in the right posterior apex and along the posterior aspect of the anterior urethra.  05/01/2016:  Abdomen and pelvis CT revealed no evidence of recurrent or metastatic disease. Bone scan on 05/01/2016 showed no evidence of osseous metastatic disease. 12/02/2017:  PET Axumin scan revealed potential local recurrence in the residual left prostate gland, with the region of activity relatively small at approximately 1.5 cm. No evidence of metastatic disease in the abdomen pelvis or skeleton.  09/09/2018:  PET Axumin scan revealed indeterminate activity in the prostate bed and no evidence of  metastasis. 06/19/2020:  Bone scan revealed mild increased activity over the left greater trochanter, left hip series suggested to exclude a focal lesion. There was no abnormal bony uptake otherwise noted to suggest metastatic disease.   06/20/2020:  Hip films revealed no evidence of hip fracture or suspicious bone lesion. There were mild enthesopathic changes at the left greater trochanter which could potentially account for mildly increased uptake on recent bone scan. 07/02/2020:  PET Axumin scan on 07/02/2020 revealed intense radiotacer uptake in the LEFT prostate gland concerning for residual carcinoma.  There was no nodal metastasis or skeletal metastasis.    No visits with results within 3 Day(s) from this visit.  Latest known visit with results is:  Appointment on 06/19/2020  Component Date Value Ref Range Status  . Prostatic Specific Antigen 06/19/2020 6.10* 0.00 - 4.00 ng/mL Final   Comment: (NOTE) While PSA levels of <=4.0 ng/ml are reported as reference range, some men with levels below 4.0 ng/ml can  have prostate cancer and many men with PSA above 4.0 ng/ml do not have prostate cancer.  Other tests such as free PSA, age specific reference ranges, PSA velocity and PSA doubling time may be helpful especially in men less than 31 years old. Performed at [facility], [address], [place], [place] [address]   . Sodium 06/19/2020 136  135 - 145 mmol/L Final  . Potassium 06/19/2020 3.7  3.5 - 5.1 mmol/L Final  . Chloride 06/19/2020 97* 98 - 111 mmol/L Final  . CO2 06/19/2020 29  22 - 32 mmol/L Final  . Glucose, Bld 06/19/2020 74  70 - 99 mg/dL Final   Glucose reference range applies only to samples taken after fasting for at least 8 hours.  . BUN 06/19/2020 9  8 - 23 mg/dL Final  . Creatinine, Ser 06/19/2020 1.00  0.61 - 1.24 mg/dL Final  . Calcium 06/19/2020 9.0  8.9 - 10.3 mg/dL Final  . Total Protein 06/19/2020 7.5  6.5 - 8.1 g/dL Final  . Albumin 06/19/2020 4.3  3.5 -  5.0 g/dL Final  . AST 06/19/2020 16  15 - 41 U/L Final  . ALT 06/19/2020 11  0 - 44 U/L Final  . Alkaline Phosphatase 06/19/2020 54  38 - 126 U/L Final  . Total Bilirubin 06/19/2020 0.9  0.3 - 1.2 mg/dL Final  . GFR calc non Af Amer 06/19/2020 >60  >60 mL/min Final  . GFR calc Af Amer 06/19/2020 >60  >60 mL/min Final  . Anion gap 06/19/2020 10  5 - 15 Final   Performed at [facility], [address], [place], [place] [address]  . WBC 06/19/2020 6.8  4.0 - 10.5 K/uL Final  . RBC 06/19/2020 4.23  4.22 - 5.81 MIL/uL Final  . Hemoglobin 06/19/2020 12.8* 13.0 - 17.0 g/dL Final  . HCT 06/19/2020 40.3  39 - 52 % Final  . MCV 06/19/2020 95.3  80.0 - 100.0 fL Final  . MCH 06/19/2020 30.3  26.0 - 34.0 pg Final  . MCHC 06/19/2020 31.8  30.0 - 36.0 g/dL Final  . RDW 06/19/2020 12.6  11.5 - 15.5 % Final  . Platelets 06/19/2020 221  150 - 400 K/uL Final  . nRBC 06/19/2020 0.0  0.0 - 0.2 % Final  . Neutrophils Relative % 06/19/2020 63  % Final  . Neutro Abs 06/19/2020 4.2  1.7 - 7.7 K/uL Final  . Lymphocytes Relative 06/19/2020 25  % Final  . Lymphs Abs 06/19/2020 1.7  0.7 - 4.0 K/uL Final  . Monocytes Relative 06/19/2020 8  % Final  . Monocytes Absolute 06/19/2020 0.5  0 - 1 K/uL Final  . Eosinophils Relative 06/19/2020 3  % Final  . Eosinophils Absolute 06/19/2020 0.2  0 - 0 K/uL Final  . Basophils Relative 06/19/2020 1  % Final  . Basophils Absolute 06/19/2020 0.1  0 - 0 K/uL Final  . Immature Granulocytes 06/19/2020 0  % Final  . Abs Immature Granulocytes 06/19/2020 0.02  0.00 - 0.07 K/uL Final   Performed at [facility], [address], [place], [place] [address]    Assessment:  James Lucas is a 79 y.o. male with prostate cancer diagnosed in 2009.  Gleason score was 4+3 in 2009.  He received cryoablation.  He developed a biochemical recurrence.  Prostate biopsy in 04/2009 revealed Gleason 5+4 adenocarcinoma.  He received IMRT with adjuvant hormonal therapy.   PSA nadir was < 0.1.  He has been  on Lupron intermittently secondary to side effects. He received Lupron (45 mg; 6 month) on 09/12/2018.  He wishes to receive only 3 month Lupron injections.  Axumin PET scan on 09/09/2018 revealed indeterminate activity in the prostate bed and no evidence of metastasis.    Bone scan on 06/19/2020 revealed mild increased activity over the left greater trochanter, left hip series suggested to exclude a focal lesion. There was no abnormal bony uptake otherwise noted to suggest metastatic disease.  Hip films on 06/20/2020 revealed no evidence of hip fracture or suspicious bone lesion. There were mild enthesopathic changes at the left greater trochanter which could potentially account for mildly increased uptake on recent bone scan.  PET Axumin scan on 07/02/2020 revealed intense radiotracer activity in the LEFT prostate gland concern for residual carcinoma.  There was no significant change from PET-CT 09/09/2018.  There was no nodal metastasis or skeletal metastasis.  He is s/p RIGHT nephrectomy.  PSA has been followed: 0.6 on 07/15/2012, 1.3 on 07/14/2013, 3.64 on 02/10/2016, 0.55 on 09/22/2016, 0.18 on 02/17/2017, 2.32 on 11/17/2017, 4.64 on 03/04/2018, 5.31 on 06/03/2018, 5.98 on 09/01/2018, 0.53 on 03/02/2019, 1.33 on 08/31/2019, 3.66 on 02/13/2020, 3.68 on 03/18/2020, and 6.10 on 06/19/2020.  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  He undergoes surveillance cystoscopy by Dr. [facility]).   Last cystoscopy was normal on 01/12/2019.  He has a normocytic anemia.  He follows a vegan diet.  He denies any melena or hematochezia.  He has B12 deficiency.  B12 was 96 on 03/16/2019.  He is on oral B12.  The patient received the second dose of the COVID-19 vaccine in 12/2019.  Symptomatically, he denies any bone pain.  He is interested in reinitiating Lupron.  Plan: 1.   Review labs from  06/19/2020. 2.   Prostate cancer  Patient initially diagnosed in 2009.  He received cryotherapy and IMRT.  With biochemical recurrence, he has received Lupron intermittently.  PSA is increasing.  Review recent bone scan and plain fils- no evidence of metastatic disease.  Review PET Axumin scan on 07/02/2020.  Images personally reviewed.  Agree with radiology interpretation.   There is increased uptake in the left prostate gland worrisome for residual carcinoma.  Patient is interested in reinitiation of Lupron.  Discuss conversation with Dr Baruch Gouty.  Patient agrees to consultation. 3.   Normocytic anemia, resolved  Hematocrit 40.3.  Hemoglobin 12.8.  MCV 95.3 on 06/19/2020.    Patient denies any bleeding.  Continue to monitor 4.   Eligard (leuprolide) 45 mg today. 5.   Tumor board on 07/18/2020. 6.   Consult Dr Baruch Gouty. 7.   MD to call patient after tumor board next week. 8.   RTC in 3 months for MD assessment and labs (CBC with diff, CMP, PSA).  I discussed the assessment and treatment plan with the patient.  The patient was provided an opportunity to ask questions and all were answered.  The patient agreed with the plan and demonstrated an understanding of the instructions.  The patient was advised to call back if the symptoms worsen or if the condition fails to improve as anticipated.   Nolon Stalls, MD, PhD    07/11/2020, 3:26 PM  I, Mirian Mo Tufford, am acting as a Education administrator for [employer]. Mike Gip, MD.   I, Zenab Gronewold C. Mike Gip, MD, have reviewed the above documentation for accuracy and completeness, and I agree with the above.

## 2020-07-11 ENCOUNTER — Other Ambulatory Visit: Payer: Self-pay

## 2020-07-11 ENCOUNTER — Inpatient Hospital Stay (HOSPITAL_BASED_OUTPATIENT_CLINIC_OR_DEPARTMENT_OTHER): Payer: Medicare Other | Admitting: Hematology and Oncology

## 2020-07-11 ENCOUNTER — Encounter: Payer: Self-pay | Admitting: Hematology and Oncology

## 2020-07-11 ENCOUNTER — Inpatient Hospital Stay: Payer: Medicare Other

## 2020-07-11 VITALS — BP 151/72 | HR 79 | Temp 98.7°F | Wt 218.7 lb

## 2020-07-11 DIAGNOSIS — C61 Malignant neoplasm of prostate: Secondary | ICD-10-CM | POA: Diagnosis not present

## 2020-07-11 MED ORDER — LEUPROLIDE ACETATE (6 MONTH) 45 MG ~~LOC~~ KIT
45.0000 mg | PACK | Freq: Once | SUBCUTANEOUS | Status: AC
  Administered 2020-07-11: 45 mg via SUBCUTANEOUS
  Filled 2020-07-11: qty 45

## 2020-07-11 NOTE — Progress Notes (Signed)
Swelling to bilateral legs in the evening x 1 month.

## 2020-07-12 ENCOUNTER — Telehealth: Payer: Self-pay | Admitting: *Deleted

## 2020-07-12 NOTE — Telephone Encounter (Signed)
Called patient to advise him of his Consultation appointment on 07/18/2020 at 1 PM with Dr. Baruch Gouty. Patient verbalized understanding.

## 2020-07-18 ENCOUNTER — Other Ambulatory Visit: Payer: Self-pay

## 2020-07-18 ENCOUNTER — Telehealth: Payer: Self-pay | Admitting: Hematology and Oncology

## 2020-07-18 ENCOUNTER — Other Ambulatory Visit: Payer: Medicare Other

## 2020-07-18 ENCOUNTER — Ambulatory Visit
Admission: RE | Admit: 2020-07-18 | Discharge: 2020-07-18 | Disposition: A | Discharge status: Home / Self Care | Payer: Medicare Other | Source: Ambulatory Visit | Attending: Radiation Oncology | Admitting: Radiation Oncology

## 2020-07-18 ENCOUNTER — Encounter: Payer: Self-pay | Admitting: Radiation Oncology

## 2020-07-18 VITALS — BP 148/82 | HR 73 | Temp 96.4°F | Resp 12 | Wt 222.5 lb

## 2020-07-18 DIAGNOSIS — Z923 Personal history of irradiation: Secondary | ICD-10-CM | POA: Diagnosis not present

## 2020-07-18 DIAGNOSIS — I10 Essential (primary) hypertension: Secondary | ICD-10-CM | POA: Insufficient documentation

## 2020-07-18 DIAGNOSIS — Z87891 Personal history of nicotine dependence: Secondary | ICD-10-CM | POA: Insufficient documentation

## 2020-07-18 DIAGNOSIS — Z79899 Other long term (current) drug therapy: Secondary | ICD-10-CM | POA: Diagnosis not present

## 2020-07-18 DIAGNOSIS — R350 Frequency of micturition: Secondary | ICD-10-CM | POA: Insufficient documentation

## 2020-07-18 DIAGNOSIS — M549 Dorsalgia, unspecified: Secondary | ICD-10-CM | POA: Diagnosis not present

## 2020-07-18 DIAGNOSIS — N529 Male erectile dysfunction, unspecified: Secondary | ICD-10-CM | POA: Insufficient documentation

## 2020-07-18 DIAGNOSIS — Z8551 Personal history of malignant neoplasm of bladder: Secondary | ICD-10-CM | POA: Insufficient documentation

## 2020-07-18 DIAGNOSIS — M48061 Spinal stenosis, lumbar region without neurogenic claudication: Secondary | ICD-10-CM | POA: Insufficient documentation

## 2020-07-18 DIAGNOSIS — C61 Malignant neoplasm of prostate: Secondary | ICD-10-CM | POA: Insufficient documentation

## 2020-07-18 DIAGNOSIS — Z7982 Long term (current) use of aspirin: Secondary | ICD-10-CM | POA: Insufficient documentation

## 2020-07-18 NOTE — Consult Note (Signed)
NEW PATIENT EVALUATION  Name: James Lucas  MRN: [id number]  Date:   07/18/2020     DOB: February 17, 1941   This 79 y.o. male patient presents to the clinic for i reevaluation of prostate cancer and patient treated radiation therapy approximately 12 years prior for Gleason 9 (5+4) adenocarcinoma status post additional cryotherapy with biochemical failure then receiving IMRT treatment now with PET scan positivity in prostatic bed REFERRING PHYSICIAN: Ezequiel Kayser, MD  CHIEF COMPLAINT:  Chief Complaint  Patient presents with  . Prostate Cancer    Initial consultation    DIAGNOSIS: The encounter diagnosis was Prostate cancer ([place]).   PREVIOUS INVESTIGATIONS:  PET/CT CT scans reviewed Clinical notes reviewed Case presented at weekly tumor conference  HPI: Patient is a 79 year old male well-known to department having been treated back over 12 years prior for Gleason 9 (5+4) adenocarcinoma.  He received radiation to his prostate as well as pelvic lymph nodes.  He initially had cryotherapy although had biochemical failure.  He has been followed by Dr. Mike Gip and is currently on Eligard.  His PSA 4 weeks prior was 6.1.  Patient is symptomatic with significant urgency and frequency of urination.  He otherwise specifically denies pelvic pain or any bone pain.  Bone scan shows no evidence to suggest metastatic disease of his bones.  In reviewing his PET scans there is been's little change since 2019 in the appearance of the hypermetabolic activity in the region of his prostate.  No tissue biopsy has been performed.  PLANNED TREATMENT REGIMEN: Continue ADT therapy  PAST MEDICAL HISTORY:  has a past medical history of Allergic rhinitis, Bladder cancer ([place]), BPH (benign prostatic hyperplasia), Chronic back pain, Essential hypertension, Glaucoma, Impotence of organic origin, Liver laceration, Prostate cancer ([place]), and Stenosis, spinal, lumbar.    PAST SURGICAL HISTORY:  Past Surgical History:   Procedure Laterality Date  . BACK SURGERY    . COLONOSCOPY    . COLONOSCOPY WITH PROPOFOL N/A 08/24/2019   Procedure: COLONOSCOPY WITH PROPOFOL;  Surgeon: Lollie Sails, MD;  Location: [facility] ENDOSCOPY;  Service: Endoscopy;  Laterality: N/A;  . HERNIA REPAIR    . NEPHRECTOMY Right 1978  . TRANSURETHRAL RESECTION OF BLADDER TUMOR WITH GYRUS (TURBT-GYRUS)      FAMILY HISTORY: family history includes Cancer in his father.  SOCIAL HISTORY:  reports that he quit smoking about 56 years ago. He has never used smokeless tobacco. He reports that he does not drink alcohol and does not use drugs.  ALLERGIES: Ace inhibitors  MEDICATIONS:  Current Outpatient Medications  Medication Sig Dispense Refill  . albuterol (VENTOLIN HFA) 108 (90 Base) MCG/ACT inhaler Inhale 2 puffs into the lungs every 4 (four) hours as needed for wheezing or shortness of breath.    Marland Kitchen aspirin EC 81 MG tablet Take 81 mg by mouth daily.     . fluticasone (FLONASE) 50 MCG/ACT nasal spray Place 1 spray into both nostrils daily as needed.     . hydrochlorothiazide (HYDRODIURIL) 25 MG tablet Take 25 mg by mouth daily.     Marland Kitchen latanoprost (XALATAN) 0.005 % ophthalmic solution Place 1 drop into both eyes at bedtime.     . montelukast (SINGULAIR) 10 MG tablet Take 10 mg by mouth daily.     . vitamin B-12 (CYANOCOBALAMIN) 1000 MCG tablet Take 1,000 mcg by mouth daily.      No current facility-administered medications for this encounter.    ECOG PERFORMANCE STATUS:  1 - Symptomatic but completely ambulatory  REVIEW OF  SYSTEMS: Patient denies any weight loss, fatigue, weakness, fever, chills or night sweats. Patient denies any loss of vision, blurred vision. Patient denies any ringing  of the ears or hearing loss. No irregular heartbeat. Patient denies heart murmur or history of fainting. Patient denies any chest pain or pain radiating to her upper extremities. Patient denies any shortness of breath, difficulty breathing at night,  cough or hemoptysis. Patient denies any swelling in the lower legs. Patient denies any nausea vomiting, vomiting of blood, or coffee ground material in the vomitus. Patient denies any stomach pain. Patient states has had normal bowel movements no significant constipation or diarrhea. Patient denies any dysuria, hematuria or significant nocturia. Patient denies any problems walking, swelling in the joints or loss of balance. Patient denies any skin changes, loss of hair or loss of weight. Patient denies any excessive worrying or anxiety or significant depression. Patient denies any problems with insomnia. Patient denies excessive thirst, polyuria, polydipsia. Patient denies any swollen glands, patient denies easy bruising or easy bleeding. Patient denies any recent infections, allergies or URI. Patient "s visual fields have not changed significantly in recent time.   PHYSICAL EXAM: BP (!) 148/82 (BP Location: Left Arm, Patient Position: Sitting, Cuff Size: Normal)   Pulse 73   Temp (!) 96.4 F (35.8 C) (Tympanic)   Resp 12   Wt 222 lb 8 oz (100.9 kg)   BMI 29.36 kg/m  Well-developed well-nourished patient in NAD. HEENT reveals PERLA, EOMI, discs not visualized.  Oral cavity is clear. No oral mucosal lesions are identified. Neck is clear without evidence of cervical or supraclavicular adenopathy. Lungs are clear to A&P. Cardiac examination is essentially unremarkable with regular rate and rhythm without murmur rub or thrill. Abdomen is benign with no organomegaly or masses noted. Motor sensory and DTR levels are equal and symmetric in the upper and lower extremities. Cranial nerves II through XII are grossly intact. Proprioception is intact. No peripheral adenopathy or edema is identified. No motor or sensory levels are noted. Crude visual fields are within normal range.  LABORATORY DATA: Labs and PSAs reviewed    RADIOLOGY RESULTS: Serial PET CT scans reviewed   IMPRESSION: Adenocarcinoma the  prostate with hypermetabolic activity in the prostate bed in 79 year old male treated 12 years prior with IMRT radiation therapy status post cryotherapy for Gleason 9 adenocarcinoma the prostate  PLAN: At this time we discussed at conference referred him to urology for possible biopsy.  At this time I do not see significant reason to add radiation therapy.  We would be limited in her dose a based on his prior cryotherapy and radiation therapy and most likely significantly exacerbate his current lower urinary tract symptoms of urgency and frequency.  Possible of an I-125 interstitial implant would be entertained should we received tissue confirmation of persistent prostate cancer although I believe the present time he is under excellent control with pulse ADT therapy which is demonstrated in the serial PET CT scan not changing over the past 3 years.  I have asked to see him back in 4 to 5 months for follow-up.  I will leave it to Dr. Mike Gip to make decision about biopsy and referral to urology.  Be happy to reevaluate him at any time.  Patient comprehends my recommendations well.  I would like to take this opportunity to thank you for allowing me to participate in the care of your patient.Noreene Filbert, MD

## 2020-07-18 NOTE — Progress Notes (Signed)
Tumor Board Documentation  James Lucas was presented by Dr Mike Gip at our Tumor Board on 07/18/2020, which included representatives from medical oncology, radiation oncology, surgical oncology, internal medicine, navigation, pathology, radiology, surgical, pharmacy, genetics, research, palliative care.  James Lucas currently presents as a current patient, for discussion with history of the following treatments: active survellience, hormonal therapy, adjuvant radiation (S/P Cryotherapy).  Additionally, we reviewed previous medical and familial history, history of present illness, and recent lab results along with all available histopathologic and imaging studies. The tumor board considered available treatment options and made the following recommendations: Biopsy Lupron therapy, Possible radiation therapy  The following procedures/referrals were also placed: No orders of the defined types were placed in this encounter.   Clinical Trial Status: not discussed   Staging used:    National site-specific guidelines   were discussed with respect to the case.  Tumor board is a meeting of clinicians from various specialty areas who evaluate and discuss patients for whom a multidisciplinary approach is being considered. Final determinations in the plan of care are those of the provider(s). The responsibility for follow up of recommendations given during tumor board is that of the provider.   Today's extended care, comprehensive team conference, James Lucas was not present for the discussion and was not examined.   Multidisciplinary Tumor Board is a multidisciplinary case peer review process.  Decisions discussed in the Multidisciplinary Tumor Board reflect the opinions of the specialists present at the conference without having examined the patient.  Ultimately, treatment and diagnostic decisions rest with the primary provider(s) and the patient.

## 2020-07-18 NOTE — Telephone Encounter (Signed)
Re:  Tumor board discussions  Tried to contact patient by phone regarding tumor board discussions.  No answer.  Message left to call back.   Lequita Asal, MD

## 2020-08-06 ENCOUNTER — Telehealth: Payer: Self-pay | Admitting: *Deleted

## 2020-08-06 NOTE — Telephone Encounter (Signed)
Patient called inquiring about biopsy that was to be set up with Urology. He states he has not heard anything form anyone about it. Please advise

## 2020-08-08 ENCOUNTER — Telehealth: Payer: Self-pay

## 2020-08-08 ENCOUNTER — Other Ambulatory Visit: Payer: Self-pay | Admitting: Hematology and Oncology

## 2020-08-08 DIAGNOSIS — C61 Malignant neoplasm of prostate: Secondary | ICD-10-CM

## 2020-08-08 NOTE — Telephone Encounter (Signed)
James Lucas I don't know anything about a biopsy with Urology. Do you?

## 2020-08-15 NOTE — Progress Notes (Signed)
08/16/2020 4:39 PM   James Lucas February 19, 1941 [id number]  Referring provider: Lequita Asal, [place],  [place] [address] Chief Complaint  Patient presents with  . New Patient (Initial Visit)    prostate cancer    HPI: James Lucas is a 79 y.o. male who presents today for management of prostate cancer. Patient treated 12 years prior with IMRT radiation therapy status post cryotherapy for Gleason 9 adenocarcinoma the prostate  Patient was followed by urologist at [facility] in [place]. Patient is followed by oncologist Dr. Mike Gip and radiologist oncologist Dr. Baruch Gouty.   The patient was diagnosed with prostate cancer in 2009.  Gleason score was 4+3 in 2009.  He received cryoablation by Dr Jacqlyn Larsen.  He then developed a rising PSA (1.4) post treatment.  Repeat prostate biopsy in 04/2009 revealed Gleason 5+4 adenocarcinoma.  He received IMRT with adjuvant hormonal therapy.  PSA nadir was < 0.1.  Axumin PET scan on 12/02/2017 revealed potential local recurrence in the residual left prostate gland, with the region of activity relatively small at approximately 1.5 cm. No evidence of metastatic disease in the abdomen pelvis or skeleton. Axumin PET scan on 09/09/2018 revealed indeterminate activity in the prostate bed and no evidence of metastasis.  Axumin PET scan on 07/02/2020 noted intense radiotracer activity in the LEFT prostate gland concern for residual carcinoma. No significant change from PET-CT 09/09/2018. No nodal metastasis or skeletal metastasis. RIGHT nephrectomy.  He has a history oflow-grade urothelial carcinoma of the bladder.  He was diagnosed in 2004 with recurrences in 2005 and 2007. Surveillance cystoscopy on 12/23/2017 was performed by Dr. Alto Denver ([facility])with recommendation to repeat in02/2020.Cystoscopy in 02/2020 was unremarkable.   Recent PSA was 6.10 on 06/19/2020.   He reports being on aspirin. He has no complaints.   PSA  trend: Component     Latest Ref Rng & Units 03/02/2019 05/11/2019 07/06/2019 08/31/2019  Prostatic Specific Antigen     0.00 - 4.00 ng/mL 0.53 0.34 0.56 1.33   Component     Latest Ref Rng & Units 02/13/2020 03/18/2020 06/19/2020  Prostatic Specific Antigen     0.00 - 4.00 ng/mL 3.66 3.68 6.10 (H)   PMH: Past Medical History:  Diagnosis Date  . Allergic rhinitis   . Bladder cancer ([place])   . BPH (benign prostatic hyperplasia)   . Chronic back pain   . Essential hypertension   . Glaucoma   . Impotence of organic origin   . Liver laceration   . Prostate cancer ([place])   . Stenosis, spinal, lumbar     Surgical History: Past Surgical History:  Procedure Laterality Date  . BACK SURGERY    . COLONOSCOPY    . COLONOSCOPY WITH PROPOFOL N/A 08/24/2019   Procedure: COLONOSCOPY WITH PROPOFOL;  Surgeon: Lollie Sails, MD;  Location[facility]COPY;  Service: Endoscopy;  Laterality: N/A;  . HERNIA REPAIR    . NEPHRECTOMY Right 1978  . TRANSURETHRAL RESECTION OF BLADDER TUMOR WITH GYRUS (TURBT-GYRUS)      Home Medications:  Allergies as of 08/16/2020      Reactions   Ace Inhibitors Cough      Medication List       Accurate as of August 16, 2020 11:59 PM. If you have any questions, ask your nurse or doctor.        albuterol 108 (90 Base) MCG/ACT inhaler Commonly known as: VENTOLIN HFA Inhale 2 puffs into the lungs every 4 (four) hours as needed for wheezing or  shortness of breath.   aspirin EC 81 MG tablet Take 81 mg by mouth daily.   fluticasone 50 MCG/ACT nasal spray Commonly known as: FLONASE Place 1 spray into both nostrils daily as needed.   hydrochlorothiazide 25 MG tablet Commonly known as: HYDRODIURIL Take 25 mg by mouth daily.   latanoprost 0.005 % ophthalmic solution Commonly known as: XALATAN Place 1 drop into both eyes at bedtime.   montelukast 10 MG tablet Commonly known as: SINGULAIR Take 10 mg by mouth daily.   vitamin B-12 1000 MCG tablet Commonly  known as: CYANOCOBALAMIN Take 1,000 mcg by mouth daily.       Allergies:  Allergies  Allergen Reactions  . Ace Inhibitors Cough    Family History: Family History  Problem Relation Age of Onset  . Cancer Father     Social History:  reports that he quit smoking about 56 years ago. He has never used smokeless tobacco. He reports that he does not drink alcohol and does not use drugs.   Physical Exam: BP (!) 151/77   Pulse 76   Ht 6' (1.829 m)   Wt 223 lb (101.2 kg)   BMI 30.24 kg/m   Constitutional:  Alert and oriented, No acute distress. HEENT: [place] AT, moist mucus membranes.  Trachea midline, no masses. Cardiovascular: No clubbing, cyanosis, or edema. Respiratory: Normal respiratory effort, no increased work of breathing. Skin: No rashes, bruises or suspicious lesions. Neurologic: Grossly intact, no focal deficits, moving all 4 extremities. Psychiatric: Normal mood and affect.  Laboratory Data:  Lab Results  Component Value Date   CREATININE 1.00 06/19/2020    Lab Results  Component Value Date   TESTOSTERONE 149 (L) 02/13/2020    Assessment & Plan:    1. Prostate cancer  Based on tumor board his oncologist and radiologist have asked me to perform a prostate biopsy for further investigation.   May consider localized therapy to the prostate in the form of focal radiation treatment versus IMRT versus HIFU if there is in fact active disease in his prostate alone in the absence of metastatic disease.  We discussed prostate biopsy in detail including the procedure itself, the risks of blood in the urine, stool, and ejaculate, serious infection, and discomfort. He is willing to proceed with this as discussed.  Patient is agreeable with this plan and was made aware to stop aspirin 1 week prior to biopsy.     [place] [address] [place], [place] [address] [phone]  I, Selena Batten, am acting as a scribe for Dr.  Hollice Espy.  I have reviewed the above documentation for accuracy and completeness, and I agree with the above.   Hollice Espy, MD  I spent 45 total minutes on the day of the encounter including pre-visit review of the medical record, face-to-face time with the patient, and post visit ordering of labs/imaging/tests.  Extensive chart review was performed including charts from medical oncology, radiation oncology, and his urologist at [facility] were all reviewed extensively.  Laboratory and radiology was also reviewed personally today.

## 2020-08-16 ENCOUNTER — Encounter: Payer: Self-pay | Admitting: Urology

## 2020-08-16 ENCOUNTER — Other Ambulatory Visit: Payer: Self-pay

## 2020-08-16 ENCOUNTER — Ambulatory Visit (INDEPENDENT_AMBULATORY_CARE_PROVIDER_SITE_OTHER): Payer: Medicare Other | Admitting: Urology

## 2020-08-16 VITALS — BP 151/77 | HR 76 | Ht 72.0 in | Wt 223.0 lb

## 2020-08-16 DIAGNOSIS — C61 Malignant neoplasm of prostate: Secondary | ICD-10-CM | POA: Diagnosis not present

## 2020-08-16 NOTE — Patient Instructions (Signed)

## 2020-08-22 ENCOUNTER — Other Ambulatory Visit: Payer: Self-pay

## 2020-08-22 ENCOUNTER — Ambulatory Visit (INDEPENDENT_AMBULATORY_CARE_PROVIDER_SITE_OTHER): Payer: Medicare Other | Admitting: Urology

## 2020-08-22 VITALS — BP 152/75 | HR 65 | Ht 72.0 in | Wt 221.0 lb

## 2020-08-22 DIAGNOSIS — C61 Malignant neoplasm of prostate: Secondary | ICD-10-CM

## 2020-08-22 MED ORDER — LEVOFLOXACIN 500 MG PO TABS
500.0000 mg | ORAL_TABLET | Freq: Once | ORAL | Status: AC
  Administered 2020-08-22: 500 mg via ORAL

## 2020-08-22 MED ORDER — GENTAMICIN SULFATE 40 MG/ML IJ SOLN
80.0000 mg | Freq: Once | INTRAMUSCULAR | Status: AC
  Administered 2020-08-22: 80 mg via INTRAMUSCULAR

## 2020-08-22 NOTE — Progress Notes (Signed)
   08/22/20  CC:  Chief Complaint  Patient presents with  . Prostate Biopsy    HPI: 79 year old male with complex prostate cancer history, please see previous note who presents today for prostate biopsy at the request of medical and radiation oncology.  Blood pressure (!) 152/75, pulse 65, height 6' (1.829 m), weight 221 lb (100.2 kg). NED. A&Ox3.   No respiratory distress   Abd soft, NT, ND Normal sphincter tone  Prostate Biopsy Procedure   Informed consent was obtained after discussing risks/benefits of the procedure.  A time out was performed to ensure correct patient identity.  Pre-Procedure: - Last PSA Level:  Lab Results  Component Value Date   PSA 0.18 02/17/2017   PSA 0.55 09/22/2016   PSA 3.64 02/10/2016   - Gentamicin given prophylactically - Levaquin 500 mg administered PO -Transrectal Ultrasound performed revealing a somewhat ill-defined prostate, borders difficult to differentiate there is no size was measured -No significant hypoechoic or median lobe noted  Procedure: - Prostate block performed using 10 cc 1% lidocaine and biopsies taken from sextant areas, a total of 12 under ultrasound guidance.  Post-Procedure: - Patient tolerated the procedure well - He was counseled to seek immediate medical attention if experiences any severe pain, significant bleeding, or fevers    Hollice Espy, MD

## 2020-08-26 LAB — SURGICAL PATHOLOGY

## 2020-09-04 ENCOUNTER — Ambulatory Visit: Payer: Medicare Other | Admitting: Urology

## 2020-10-02 NOTE — Progress Notes (Signed)
[facility]  [address] [place], [place] [address] Phone: [phone]  Fax: [phone]   Clinic Day:  10/03/2020   Referring physician: Ezequiel Kayser, MD  Chief Complaint: James Lucas is a 79 y.o. male with a long-standing history of prostate cancer who is seen for 3 month assessment.  HPI:  The patient was last seen in the medical oncology clinic on 07/11/2020. At that time, he denied any bone pain.  He was interested in reinitiating Lupron. We discussed radiation consult. He received Eligard.   Tumor Board on 07/18/2020 recommended biopsy, Lupron, and possible radiation therapy.  The patient saw Dr. Baruch Gouty on 07/18/2020. It was felt that there was no significant reason to add radiation therapy. His dose would be limited based on prior cryotherapy and radiation therapy and radiation would likely exacerbate his urinary symptoms. Consideration was made for I-125 interstitial implant.  They discussed referring him to urology for a possible biopsy. Follow up was planned in 4-5 months.  Prostate biopsy on 08/22/2020 by Dr. Erlene Quan revealed prostatic adenocarcinoma in 9/12 (six from the left side; 3 from the right side) biopsies.  The tumor showed mixed morphology with some areas showing ADT treatment effect and other areas without significant treatment effect.  Pattern was Gleason 4 and 5 in areas of less treatment effect.  The background prostate was fibrotic c/w radiation effect.  During the interim, he has been "good." He reports sweats and ankle swelling.  He has occasional bone pain, but nothing that he cannot tolerate. He denies blood in the stool or black stools.   The patient still follows a vegan diet. He takes vitamin B12 1000 mcg daily.   Past Medical History:  Diagnosis Date  . Allergic rhinitis   . Bladder cancer ([place])   . BPH (benign prostatic hyperplasia)   . Chronic back pain   . Essential hypertension   . Glaucoma   .  Impotence of organic origin   . Liver laceration   . Prostate cancer ([place])   . Stenosis, spinal, lumbar     Past Surgical History:  Procedure Laterality Date  . BACK SURGERY    . COLONOSCOPY    . COLONOSCOPY WITH PROPOFOL N/A 08/24/2019   Procedure: COLONOSCOPY WITH PROPOFOL;  Surgeon: Lollie Sails, MD;  Location: [facility] ENDOSCOPY;  Service: Endoscopy;  Laterality: N/A;  . HERNIA REPAIR    . NEPHRECTOMY Right 1978  . TRANSURETHRAL RESECTION OF BLADDER TUMOR WITH GYRUS (TURBT-GYRUS)      Family History  Problem Relation Age of Onset  . Cancer Father     Social History:  reports that he quit smoking about 56 years ago. He has never used smokeless tobacco. He reports that he does not drink alcohol and does not use drugs. He is a former smoker and smoked several packs per day in his early 27s but has not smoked in over 4 years. He does not drink alcohol. He is retired but used to work for [employer]. He denies known exposure to radiation or toxins. The patient is alone today.   Allergies:  Allergies  Allergen Reactions  . Ace Inhibitors Cough    Current Medications: Current Outpatient Medications  Medication Sig Dispense Refill  . aspirin EC 81 MG tablet Take 81 mg by mouth daily.     . hydrochlorothiazide (HYDRODIURIL) 25 MG tablet Take 25 mg by mouth daily.     Marland Kitchen latanoprost (XALATAN) 0.005 % ophthalmic solution Place 1 drop into both eyes  at bedtime.     . montelukast (SINGULAIR) 10 MG tablet Take 10 mg by mouth daily.     . vitamin B-12 (CYANOCOBALAMIN) 1000 MCG tablet Take 1,000 mcg by mouth daily.     Marland Kitchen albuterol (VENTOLIN HFA) 108 (90 Base) MCG/ACT inhaler Inhale 2 puffs into the lungs every 4 (four) hours as needed for wheezing or shortness of breath. (Patient not taking: Reported on 10/03/2020)    . fluticasone (FLONASE) 50 MCG/ACT nasal spray Place 1 spray into both nostrils daily as needed.  (Patient not taking: Reported on 10/03/2020)     No current facility-administered  medications for this visit.    Review of Systems  Constitutional: Positive for diaphoresis (sweats). Negative for chills, fever, malaise/fatigue and weight loss (up 11 lbs).       Feels "good."  HENT: Negative.  Negative for congestion, ear discharge, ear pain, hearing loss, nosebleeds, sinus pain, sore throat and tinnitus.   Eyes: Negative.  Negative for blurred vision.  Respiratory: Negative.  Negative for cough, hemoptysis, sputum production and shortness of breath.   Cardiovascular: Positive for leg swelling. Negative for chest pain, palpitations and PND.  Gastrointestinal: Negative.  Negative for abdominal pain, blood in stool, constipation, diarrhea, heartburn, melena, nausea and vomiting.       Vegan diet.  Genitourinary: Negative.  Negative for dysuria, frequency, hematuria and urgency.  Musculoskeletal: Negative for back pain, joint pain, myalgias and neck pain.       Occasional mild bone pain.  Skin: Negative.  Negative for itching and rash.  Neurological: Negative for dizziness, tingling, sensory change, speech change, focal weakness, weakness and headaches.  Endo/Heme/Allergies: Negative.  Does not bruise/bleed easily.  Psychiatric/Behavioral: Negative.  Negative for depression and memory loss. The patient is not nervous/anxious and does not have insomnia.   All other systems reviewed and are negative.  Performance status (ECOG): 0-1  Vital Signs: Blood pressure (!) 147/68, pulse 67, temperature 98.9 F (37.2 C), temperature source Tympanic, resp. rate 17, weight 229 lb 6.2 oz (104.1 kg), SpO2 100 %.  Physical Exam Vitals and nursing note reviewed.  Constitutional:      General: He is not in acute distress.    Appearance: He is well-developed. He is not diaphoretic.  HENT:     Head: Normocephalic and atraumatic.     Mouth/Throat:     Mouth: Mucous membranes are moist.     Pharynx: Oropharynx is clear.  Eyes:     General: No scleral icterus.    Extraocular Movements:  Extraocular movements intact.     Conjunctiva/sclera: Conjunctivae normal.     Pupils: Pupils are equal, round, and reactive to light.     Comments: Glasses.  Brown eyes.  Cardiovascular:     Rate and Rhythm: Normal rate and regular rhythm.     Heart sounds: Normal heart sounds. No murmur heard.   Pulmonary:     Effort: Pulmonary effort is normal. No respiratory distress.     Breath sounds: Normal breath sounds. No wheezing or rales.  Chest:     Chest wall: No tenderness.  Abdominal:     General: Bowel sounds are normal. There is no distension.     Palpations: Abdomen is soft. There is no mass.     Tenderness: There is no abdominal tenderness. There is no guarding or rebound.  Musculoskeletal:        General: No swelling or tenderness. Normal range of motion.     Cervical back: Normal range of  motion and neck supple.  Lymphadenopathy:     Head:     Right side of head: No preauricular, posterior auricular or occipital adenopathy.     Left side of head: No preauricular, posterior auricular or occipital adenopathy.     Cervical: No cervical adenopathy.     Upper Body:     Right upper body: No supraclavicular or axillary adenopathy.     Left upper body: No supraclavicular or axillary adenopathy.     Lower Body: No right inguinal adenopathy. No left inguinal adenopathy.  Skin:    General: Skin is warm and dry.  Neurological:     Mental Status: He is alert and oriented to person, place, and time.  Psychiatric:        Behavior: Behavior normal.        Thought Content: Thought content normal.        Judgment: Judgment normal.    Imaging studies: 05/23/2014:  Prostate MRI revealed areas suspicious for extracapsular extension in the right posterior apex and along the posterior aspect of the anterior urethra.  05/01/2016:  Abdomen and pelvis CT revealed no evidence of recurrent or metastatic disease. Bone scan on 05/01/2016 showed no evidence of osseous metastatic disease. 12/02/2017:   PET Axumin scan revealed potential local recurrence in the residual left prostate gland, with the region of activity relatively small at approximately 1.5 cm. No evidence of metastatic disease in the abdomen pelvis or skeleton.  09/09/2018:  PET Axumin scan revealed indeterminate activity in the prostate bed and no evidence of metastasis. 06/19/2020:  Bone scan revealed mild increased activity over the left greater trochanter, left hip series suggested to exclude a focal lesion. There was no abnormal bony uptake otherwise noted to suggest metastatic disease.   06/20/2020:  Hip films revealed no evidence of hip fracture or suspicious bone lesion. There were mild enthesopathic changes at the left greater trochanter which could potentially account for mildly increased uptake on recent bone scan. 07/02/2020:  PET Axumin scan on 07/02/2020 revealed intense radiotacer uptake in the LEFT prostate gland concerning for residual carcinoma.  There was no nodal metastasis or skeletal metastasis.    Appointment on 10/03/2020  Component Date Value Ref Range Status  . Sodium 10/03/2020 139  135 - 145 mmol/L Final  . Potassium 10/03/2020 3.7  3.5 - 5.1 mmol/L Final  . Chloride 10/03/2020 101  98 - 111 mmol/L Final  . CO2 10/03/2020 29  22 - 32 mmol/L Final  . Glucose, Bld 10/03/2020 100* 70 - 99 mg/dL Final   Glucose reference range applies only to samples taken after fasting for at least 8 hours.  . BUN 10/03/2020 8  8 - 23 mg/dL Final  . Creatinine, Ser 10/03/2020 0.93  0.61 - 1.24 mg/dL Final  . Calcium 10/03/2020 8.9  8.9 - 10.3 mg/dL Final  . Total Protein 10/03/2020 7.1  6.5 - 8.1 g/dL Final  . Albumin 10/03/2020 4.3  3.5 - 5.0 g/dL Final  . AST 10/03/2020 22  15 - 41 U/L Final  . ALT 10/03/2020 19  0 - 44 U/L Final  . Alkaline Phosphatase 10/03/2020 51  38 - 126 U/L Final  . Total Bilirubin 10/03/2020 0.2* 0.3 - 1.2 mg/dL Final  . GFR, Estimated 10/03/2020 >60  >60 mL/min Final   Comment:  (NOTE) Calculated using the CKD-EPI Creatinine Equation (2021)   . Anion gap 10/03/2020 9  5 - 15 Final   Performed at [facility], [place]  [address] [place], [place] [address]  . WBC 10/03/2020 6.7  4.0 - 10.5 K/uL Final  . RBC 10/03/2020 3.89* 4.22 - 5.81 MIL/uL Final  . Hemoglobin 10/03/2020 12.0* 13.0 - 17.0 g/dL Final  . HCT 10/03/2020 36.9* 39 - 52 % Final  . MCV 10/03/2020 94.9  80.0 - 100.0 fL Final  . MCH 10/03/2020 30.8  26.0 - 34.0 pg Final  . MCHC 10/03/2020 32.5  30.0 - 36.0 g/dL Final  . RDW 10/03/2020 12.8  11.5 - 15.5 % Final  . Platelets 10/03/2020 229  150 - 400 K/uL Final  . nRBC 10/03/2020 0.0  0.0 - 0.2 % Final  . Neutrophils Relative % 10/03/2020 52  % Final  . Neutro Abs 10/03/2020 3.5  1.7 - 7.7 K/uL Final  . Lymphocytes Relative 10/03/2020 32  % Final  . Lymphs Abs 10/03/2020 2.1  0.7 - 4.0 K/uL Final  . Monocytes Relative 10/03/2020 6  % Final  . Monocytes Absolute 10/03/2020 0.4  0.1 - 1.0 K/uL Final  . Eosinophils Relative 10/03/2020 8  % Final  . Eosinophils Absolute 10/03/2020 0.5  0.0 - 0.5 K/uL Final  . Basophils Relative 10/03/2020 2  % Final  . Basophils Absolute 10/03/2020 0.1  0.0 - 0.1 K/uL Final  . Immature Granulocytes 10/03/2020 0  % Final  . Abs Immature Granulocytes 10/03/2020 0.01  0.00 - 0.07 K/uL Final   Performed at [facility], [address], [place], [place] [address]    Assessment:  Juanmiguel Defelice is a 79 y.o. male with prostate cancer diagnosed in 2009.  Gleason score was 4+3 in 2009.  He received cryoablation.  He developed a biochemical recurrence.  Prostate biopsy in 04/2009 revealed Gleason 5+4 adenocarcinoma.  He received IMRT with adjuvant hormonal therapy.  PSA nadir was < 0.1.  He has been on Lupron intermittently secondary to side effects. He received Lupron (45 mg; 6 month) on 09/12/2018.  He received Eliguard 45 mg on 07/11/2020.  Axumin PET scan on 09/09/2018 revealed indeterminate activity in  the prostate bed and no evidence of metastasis.    Bone scan on 06/19/2020 revealed mild increased activity over the left greater trochanter, left hip series suggested to exclude a focal lesion. There was no abnormal bony uptake otherwise noted to suggest metastatic disease.  Hip films on 06/20/2020 revealed no evidence of hip fracture or suspicious bone lesion. There were mild enthesopathic changes at the left greater trochanter which could potentially account for mildly increased uptake on recent bone scan.  PET Axumin scan on 07/02/2020 revealed intense radiotracer activity in the LEFT prostate gland concern for residual carcinoma.  There was no significant change from PET-CT 09/09/2018.  There was no nodal metastasis or skeletal metastasis.  He is s/p RIGHT nephrectomy.  Prostate biopsy on 08/22/2020 revealed prostatic adenocarcinoma in 9/12 (six from the left side; 3 from the right side) biopsies.  The tumor showed mixed morphology with some areas showing ADT treatment effect and other areas without significant treatment effect.  Pattern was Gleason 4 and 5 in areas of less treatment effect.  The background prostate was fibrotic c/w radiation effect.  PSA has been followed: 0.6 on 07/15/2012, 1.3 on 07/14/2013, 3.64 on 02/10/2016, 0.55 on 09/22/2016, 0.18 on 02/17/2017, 2.32 on 11/17/2017, 4.64 on 03/04/2018, 5.31 on 06/03/2018, 5.98 on 09/01/2018, 0.53 on 03/02/2019, 1.33 on 08/31/2019, 3.66 on 02/13/2020, 3.68 on 03/18/2020, 6.10 on 06/19/2020, and 0.65 on 10/03/2020.  He has a history of low-grade urothelial carcinoma of  the bladder diagnosed in 2004 with recurrences in 2005 and 2007.  He undergoes surveillance cystoscopy by Dr. Alto Denver [facility]).   Last cystoscopy was normal on 01/12/2019.  He has a normocytic anemia.  He follows a vegan diet.  He denies any melena or hematochezia.  He has B12 deficiency.  B12 was 96 on 03/16/2019.  He is on oral B12.  B12 was 607 on 01/30/2020.  The patient  received the second dose of the COVID-19 vaccine in 12/2019.  Symptomatically, he feels "good." He reports sweats s/p Eliguard.  He has occasional bone pain. He denies blood in the stool or black stools.   He is on a vegan diet. He takes vitamin B12 1000 mcg daily.  Exam is unremarkable.  Plan: 1.   Labs today: CBC with diff, CMP, PSA, ferritin, iron studies. 2.   Prostate cancer  Patient initially diagnosed in 2009.  He received cryotherapy and IMRT.  With biochemical recurrence, he has received Lupron intermittently.  PET Axumin scan on 07/02/2020 revealed increased uptake in the left prostate.   Prostate biopsy on 08/22/2020 confirmed persistent disease.  He began [place] on 07/11/2020.  PSA was 6.10 on 06/19/2020 and 0.65 on 10/03/2020.  Review interim radiation oncology consult.  Continue Eliguard every 6 months. 3.   Normocytic anemia  Hematocrit 40.3.  Hemoglobin 12.8.  MCV 95.3 on 06/19/2020.    Hematocrit 36.9.  Hemoglobin 12.0.  MCV 94.9 on 10/03/2020.    He denies any bleeding.  He is on a vegan diet.  B12 was 607 on 01/30/2020.  Folate was 20.9 on 03/16/2019.  Check ferritin and iron studies today. 4.   RTC in 3 months (around 01/11/2021) for MD assessment, labs (CBC with diff, CMP, ferritin, PSA), and Eliguard.  Addendum:  Ferritin 31 with an iron saturation of 13% and a TIBC of 309 consistent with iron deficiency anemia.  Patient will be contacted regarding initiation of oral iron with follow-up labs in 4 to 6 weeks.  I discussed the assessment and treatment plan with the patient.  The patient was provided an opportunity to ask questions and all were answered.  The patient agreed with the plan and demonstrated an understanding of the instructions.  The patient was advised to call back if the symptoms worsen or if the condition fails to improve as anticipated.   Nolon Stalls, MD, PhD    10/03/2020, 4:00 PM  I, Mirian Mo Tufford, am acting as a Education administrator for [employer].  Mike Gip, MD.   I, Rayen Palen C. Mike Gip, MD, have reviewed the above documentation for accuracy and completeness, and I agree with the above.

## 2020-10-03 ENCOUNTER — Other Ambulatory Visit: Payer: Self-pay

## 2020-10-03 ENCOUNTER — Inpatient Hospital Stay: Payer: Medicare Other | Attending: Hematology and Oncology | Admitting: Hematology and Oncology

## 2020-10-03 ENCOUNTER — Encounter: Payer: Self-pay | Admitting: Hematology and Oncology

## 2020-10-03 ENCOUNTER — Inpatient Hospital Stay: Payer: Medicare Other

## 2020-10-03 VITALS — BP 147/68 | HR 67 | Temp 98.9°F | Resp 17 | Wt 229.4 lb

## 2020-10-03 DIAGNOSIS — C61 Malignant neoplasm of prostate: Secondary | ICD-10-CM

## 2020-10-03 DIAGNOSIS — E538 Deficiency of other specified B group vitamins: Secondary | ICD-10-CM | POA: Insufficient documentation

## 2020-10-03 DIAGNOSIS — Z8551 Personal history of malignant neoplasm of bladder: Secondary | ICD-10-CM | POA: Insufficient documentation

## 2020-10-03 DIAGNOSIS — Z79899 Other long term (current) drug therapy: Secondary | ICD-10-CM | POA: Insufficient documentation

## 2020-10-03 DIAGNOSIS — Z87891 Personal history of nicotine dependence: Secondary | ICD-10-CM | POA: Insufficient documentation

## 2020-10-03 DIAGNOSIS — M25473 Effusion, unspecified ankle: Secondary | ICD-10-CM | POA: Insufficient documentation

## 2020-10-03 DIAGNOSIS — I10 Essential (primary) hypertension: Secondary | ICD-10-CM | POA: Insufficient documentation

## 2020-10-03 DIAGNOSIS — D649 Anemia, unspecified: Secondary | ICD-10-CM | POA: Insufficient documentation

## 2020-10-03 DIAGNOSIS — Z905 Acquired absence of kidney: Secondary | ICD-10-CM | POA: Insufficient documentation

## 2020-10-03 DIAGNOSIS — Z7982 Long term (current) use of aspirin: Secondary | ICD-10-CM | POA: Insufficient documentation

## 2020-10-03 DIAGNOSIS — N400 Enlarged prostate without lower urinary tract symptoms: Secondary | ICD-10-CM | POA: Diagnosis not present

## 2020-10-03 LAB — CBC WITH DIFFERENTIAL/PLATELET
Abs Immature Granulocytes: 0.01 10*3/uL (ref 0.00–0.07)
Basophils Absolute: 0.1 10*3/uL (ref 0.0–0.1)
Basophils Relative: 2 %
Eosinophils Absolute: 0.5 10*3/uL (ref 0.0–0.5)
Eosinophils Relative: 8 %
HCT: 36.9 % — ABNORMAL LOW (ref 39.0–52.0)
Hemoglobin: 12 g/dL — ABNORMAL LOW (ref 13.0–17.0)
Immature Granulocytes: 0 %
Lymphocytes Relative: 32 %
Lymphs Abs: 2.1 10*3/uL (ref 0.7–4.0)
MCH: 30.8 pg (ref 26.0–34.0)
MCHC: 32.5 g/dL (ref 30.0–36.0)
MCV: 94.9 fL (ref 80.0–100.0)
Monocytes Absolute: 0.4 10*3/uL (ref 0.1–1.0)
Monocytes Relative: 6 %
Neutro Abs: 3.5 10*3/uL (ref 1.7–7.7)
Neutrophils Relative %: 52 %
Platelets: 229 10*3/uL (ref 150–400)
RBC: 3.89 MIL/uL — ABNORMAL LOW (ref 4.22–5.81)
RDW: 12.8 % (ref 11.5–15.5)
WBC: 6.7 10*3/uL (ref 4.0–10.5)
nRBC: 0 % (ref 0.0–0.2)

## 2020-10-03 LAB — IRON AND TIBC
Iron: 44 ug/dL — ABNORMAL LOW (ref 45–182)
Saturation Ratios: 13 % — ABNORMAL LOW (ref 17.9–39.5)
TIBC: 353 ug/dL (ref 250–450)
UIBC: 309 ug/dL

## 2020-10-03 LAB — COMPREHENSIVE METABOLIC PANEL
ALT: 19 U/L (ref 0–44)
AST: 22 U/L (ref 15–41)
Albumin: 4.3 g/dL (ref 3.5–5.0)
Alkaline Phosphatase: 51 U/L (ref 38–126)
Anion gap: 9 (ref 5–15)
BUN: 8 mg/dL (ref 8–23)
CO2: 29 mmol/L (ref 22–32)
Calcium: 8.9 mg/dL (ref 8.9–10.3)
Chloride: 101 mmol/L (ref 98–111)
Creatinine, Ser: 0.93 mg/dL (ref 0.61–1.24)
GFR, Estimated: >60 mL/min (ref >60)
Glucose, Bld: 100 mg/dL — ABNORMAL HIGH (ref 70–99)
Potassium: 3.7 mmol/L (ref 3.5–5.1)
Sodium: 139 mmol/L (ref 135–145)
Total Bilirubin: 0.2 mg/dL — ABNORMAL LOW (ref 0.3–1.2)
Total Protein: 7.1 g/dL (ref 6.5–8.1)

## 2020-10-03 LAB — FERRITIN: Ferritin: 31 ng/mL (ref 24–336)

## 2020-10-03 NOTE — Progress Notes (Signed)
Patient here for oncology follow-up appointment, chronic urinary retention, otherwise no other issues.

## 2020-10-04 LAB — PSA: Prostatic Specific Antigen: 0.65 ng/mL (ref 0.00–4.00)

## 2020-10-22 ENCOUNTER — Other Ambulatory Visit: Payer: Self-pay

## 2020-10-22 ENCOUNTER — Telehealth: Payer: Self-pay

## 2020-10-22 DIAGNOSIS — E538 Deficiency of other specified B group vitamins: Secondary | ICD-10-CM

## 2020-10-22 DIAGNOSIS — R948 Abnormal results of function studies of other organs and systems: Secondary | ICD-10-CM

## 2020-10-22 DIAGNOSIS — D649 Anemia, unspecified: Secondary | ICD-10-CM

## 2020-10-22 DIAGNOSIS — C61 Malignant neoplasm of prostate: Secondary | ICD-10-CM

## 2020-10-22 NOTE — Telephone Encounter (Signed)
Patient aware. Sent my chart message so he would have in writing as well. Shirlean Mylar will you call and schedule his lab appt. Lab orders are in

## 2020-10-22 NOTE — Telephone Encounter (Signed)
-----   Message from Lequita Asal, MD sent at 10/22/2020  8:49 AM EST ----- Regarding: Please call patient  Was patient called about his iron studies?  Iron studies c/w iron deficiency anemia.  Begin oral iron with OJ or vitamin C.  Recheck labs (CBC, ferritin, iron studies) in 6 weeks.  M ----- Message ----- From: Buel Ream, Lab In [place] Sent: 10/03/2020  11:28 PM EST To: Lequita Asal, MD

## 2020-11-29 ENCOUNTER — Telehealth: Payer: Self-pay | Admitting: Hematology and Oncology

## 2020-11-29 NOTE — Telephone Encounter (Signed)
Pt called and would like to r/s his appointment on 12/03/20 (for a funeral) if possible.

## 2020-11-29 NOTE — Telephone Encounter (Signed)
  Sounds fine.  M 

## 2020-11-29 NOTE — Telephone Encounter (Signed)
Called pt to r\s lab appt with no answer. Left pt VM to contact office to reschedule.

## 2020-12-03 ENCOUNTER — Other Ambulatory Visit: Payer: Medicare Other

## 2020-12-03 ENCOUNTER — Inpatient Hospital Stay: Payer: Medicare Other

## 2020-12-05 ENCOUNTER — Other Ambulatory Visit: Payer: Self-pay

## 2020-12-05 ENCOUNTER — Inpatient Hospital Stay: Payer: Medicare Other | Attending: Hematology and Oncology

## 2020-12-05 DIAGNOSIS — D649 Anemia, unspecified: Secondary | ICD-10-CM | POA: Diagnosis not present

## 2020-12-05 DIAGNOSIS — E538 Deficiency of other specified B group vitamins: Secondary | ICD-10-CM | POA: Diagnosis present

## 2020-12-05 DIAGNOSIS — C61 Malignant neoplasm of prostate: Secondary | ICD-10-CM | POA: Diagnosis not present

## 2020-12-05 LAB — CBC WITH DIFFERENTIAL/PLATELET
Abs Immature Granulocytes: 0.01 10*3/uL (ref 0.00–0.07)
Basophils Absolute: 0.1 10*3/uL (ref 0.0–0.1)
Basophils Relative: 1 %
Eosinophils Absolute: 0.4 10*3/uL (ref 0.0–0.5)
Eosinophils Relative: 6 %
HCT: 37.4 % — ABNORMAL LOW (ref 39.0–52.0)
Hemoglobin: 12 g/dL — ABNORMAL LOW (ref 13.0–17.0)
Immature Granulocytes: 0 %
Lymphocytes Relative: 27 %
Lymphs Abs: 1.9 10*3/uL (ref 0.7–4.0)
MCH: 31 pg (ref 26.0–34.0)
MCHC: 32.1 g/dL (ref 30.0–36.0)
MCV: 96.6 fL (ref 80.0–100.0)
Monocytes Absolute: 0.5 10*3/uL (ref 0.1–1.0)
Monocytes Relative: 8 %
Neutro Abs: 4 10*3/uL (ref 1.7–7.7)
Neutrophils Relative %: 58 %
Platelets: 206 10*3/uL (ref 150–400)
RBC: 3.87 MIL/uL — ABNORMAL LOW (ref 4.22–5.81)
RDW: 12.5 % (ref 11.5–15.5)
WBC: 6.9 10*3/uL (ref 4.0–10.5)
nRBC: 0 % (ref 0.0–0.2)

## 2020-12-05 LAB — IRON AND TIBC
Iron: 55 ug/dL (ref 45–182)
Saturation Ratios: 17 % — ABNORMAL LOW (ref 17.9–39.5)
TIBC: 326 ug/dL (ref 250–450)
UIBC: 271 ug/dL

## 2020-12-05 LAB — FERRITIN: Ferritin: 42 ng/mL (ref 24–336)

## 2020-12-10 ENCOUNTER — Telehealth: Payer: Self-pay | Admitting: Hematology and Oncology

## 2020-12-10 ENCOUNTER — Telehealth: Payer: Self-pay | Admitting: *Deleted

## 2020-12-10 NOTE — Telephone Encounter (Signed)
James Lucas, I'm not working in [employer].

## 2020-12-10 NOTE — Telephone Encounter (Signed)
Patient returned scheduling call please call him back.

## 2020-12-10 NOTE — Telephone Encounter (Signed)
Called patient in reference to trying to reschedule patient's appointment. Unsucessful and left a message for him to call back.

## 2021-01-01 ENCOUNTER — Encounter: Payer: Self-pay | Admitting: Hematology and Oncology

## 2021-01-01 NOTE — Progress Notes (Signed)
[facility]  [address] [place], [place] [address] Phone: [phone]  Fax: [phone]   Clinic Day:  01/02/2021   Referring physician: Ezequiel Kayser, MD  Chief Complaint: James Lucas is a 80 y.o. male with a long-standing history of prostate cancer who is seen for 3 month assessment.  HPI:  The patient was last seen in the medical oncology clinic on 10/03/2020. At that time, he felt "good." He reported sweats s/p Eliguard. He had occasional bone pain. He denied blood in the stool or black stools. He was on a vegan diet. He was taking vitamin B12 1000 mcg daily.  Exam was unremarkable. Hematocrit was 36.9, hemoglobin 12.0, MCV 94.9, platelets 229,000, WBC 6,700. Ferritin was 31 with an iron saturation of 13% and a TIBC of 353.  PSA was 0.65.  He was called on 10/22/2020 and instructed to begin oral iron.  Labs on 12/05/2020 revealed a hematocrit of 37.4, hemoglobin 12.0, MCV 96.6, platelets 206,000, WBC 6,900. Ferritin was 42 with an iron saturation of 17% and a TIBC of 326.  During the interim, he has been "good." He still has sweats. He has bone pain in his fingers and left knee. He follows a vegan diet. He tries to eat green leafy vegetables. He has been taking oral iron once daily with orange juice since 10/22/2020. He denies blood in the stool, black stools, and hematuria.   Past Medical History:  Diagnosis Date  . Allergic rhinitis   . Bladder cancer ([place])   . BPH (benign prostatic hyperplasia)   . Chronic back pain   . Essential hypertension   . Glaucoma   . Impotence of organic origin   . Liver laceration   . Prostate cancer ([place])   . Stenosis, spinal, lumbar     Past Surgical History:  Procedure Laterality Date  . BACK SURGERY    . COLONOSCOPY    . COLONOSCOPY WITH PROPOFOL N/A 08/24/2019   Procedure: COLONOSCOPY WITH PROPOFOL;  Surgeon: Lollie Sails, MD;  Location: [facility] ENDOSCOPY;  Service: Endoscopy;  Laterality: N/A;   . HERNIA REPAIR    . NEPHRECTOMY Right 1978  . TRANSURETHRAL RESECTION OF BLADDER TUMOR WITH GYRUS (TURBT-GYRUS)      Family History  Problem Relation Age of Onset  . Cancer Father     Social History:  reports that he quit smoking about 57 years ago. He has never used smokeless tobacco. He reports that he does not drink alcohol and does not use drugs. He is a former smoker and smoked several packs per day in his early 23s but has not smoked in over 23 years. He does not drink alcohol. He is retired but used to work for [employer]. He denies known exposure to radiation or toxins. The patient is alone today.   Allergies:  Allergies  Allergen Reactions  . Ace Inhibitors Cough    Current Medications: Current Outpatient Medications  Medication Sig Dispense Refill  . aspirin EC 81 MG tablet Take 81 mg by mouth daily.     . ferrous sulfate 325 (65 FE) MG tablet Take by mouth.    . fluticasone (FLONASE) 50 MCG/ACT nasal spray Place 1 spray into both nostrils daily as needed.    . hydrochlorothiazide (HYDRODIURIL) 25 MG tablet Take 25 mg by mouth daily.     Marland Kitchen latanoprost (XALATAN) 0.005 % ophthalmic solution Place 1 drop into both eyes at bedtime.     . montelukast (SINGULAIR) 10 MG tablet Take  10 mg by mouth daily.     . vitamin B-12 (CYANOCOBALAMIN) 1000 MCG tablet Take 1,000 mcg by mouth daily.     Marland Kitchen albuterol (VENTOLIN HFA) 108 (90 Base) MCG/ACT inhaler Inhale 2 puffs into the lungs every 4 (four) hours as needed for wheezing or shortness of breath. (Patient not taking: No sig reported)     No current facility-administered medications for this visit.    Review of Systems  Constitutional: Positive for diaphoresis (sweats). Negative for chills, fever, malaise/fatigue and weight loss (stable).       Feels "good."  HENT: Negative.  Negative for congestion, ear discharge, ear pain, hearing loss, nosebleeds, sinus pain, sore throat and tinnitus.   Eyes: Negative.  Negative for blurred vision.   Respiratory: Negative.  Negative for cough, hemoptysis, sputum production and shortness of breath.   Cardiovascular: Negative for chest pain, palpitations and leg swelling.  Gastrointestinal: Negative.  Negative for abdominal pain, blood in stool, constipation, diarrhea, heartburn, melena, nausea and vomiting.       Vegan diet.  Genitourinary: Negative.  Negative for dysuria, frequency, hematuria and urgency.  Musculoskeletal: Positive for joint pain (left knee, fingers). Negative for back pain, myalgias and neck pain.  Skin: Negative.  Negative for itching and rash.  Neurological: Negative for dizziness, tingling, sensory change, speech change, focal weakness, weakness and headaches.  Endo/Heme/Allergies: Negative.  Does not bruise/bleed easily.  Psychiatric/Behavioral: Negative.  Negative for depression and memory loss. The patient is not nervous/anxious and does not have insomnia.   All other systems reviewed and are negative.  Performance status (ECOG): 1  Vital Signs: Blood pressure (!) 146/78, pulse 62, temperature (!) 96.6 F (35.9 C), temperature source Tympanic, resp. rate 16, weight 229 lb 9.8 oz (104.2 kg), SpO2 100 %.  Physical Exam Vitals and nursing note reviewed.  Constitutional:      General: He is not in acute distress.    Appearance: He is well-developed. He is not diaphoretic.  HENT:     Head: Normocephalic and atraumatic.     Mouth/Throat:     Mouth: Mucous membranes are moist.     Pharynx: Oropharynx is clear.  Eyes:     General: No scleral icterus.    Extraocular Movements: Extraocular movements intact.     Conjunctiva/sclera: Conjunctivae normal.     Pupils: Pupils are equal, round, and reactive to light.     Comments: Glasses.  Brown eyes.  Cardiovascular:     Rate and Rhythm: Normal rate and regular rhythm.     Heart sounds: Normal heart sounds. No murmur heard.   Pulmonary:     Effort: Pulmonary effort is normal. No respiratory distress.     Breath  sounds: Normal breath sounds. No wheezing or rales.  Chest:     Chest wall: No tenderness.  Breasts:     Right: No axillary adenopathy or supraclavicular adenopathy.     Left: No axillary adenopathy or supraclavicular adenopathy.    Abdominal:     General: Bowel sounds are normal. There is no distension.     Palpations: Abdomen is soft. There is no mass.     Tenderness: There is no abdominal tenderness. There is no guarding or rebound.  Musculoskeletal:        General: No swelling or tenderness. Normal range of motion.     Cervical back: Normal range of motion and neck supple.  Lymphadenopathy:     Head:     Right side of head: No preauricular, posterior  auricular or occipital adenopathy.     Left side of head: No preauricular, posterior auricular or occipital adenopathy.     Cervical: No cervical adenopathy.     Upper Body:     Right upper body: No supraclavicular or axillary adenopathy.     Left upper body: No supraclavicular or axillary adenopathy.     Lower Body: No right inguinal adenopathy. No left inguinal adenopathy.  Skin:    General: Skin is warm and dry.  Neurological:     Mental Status: He is alert and oriented to person, place, and time.  Psychiatric:        Behavior: Behavior normal.        Thought Content: Thought content normal.        Judgment: Judgment normal.    Imaging studies: 05/23/2014:  Prostate MRI revealed areas suspicious for extracapsular extension in the right posterior apex and along the posterior aspect of the anterior urethra.  05/01/2016:  Abdomen and pelvis CT revealed no evidence of recurrent or metastatic disease. Bone scan on 05/01/2016 showed no evidence of osseous metastatic disease. 12/02/2017:  PET Axumin scan revealed potential local recurrence in the residual left prostate gland, with the region of activity relatively small at approximately 1.5 cm. No evidence of metastatic disease in the abdomen pelvis or skeleton.  09/09/2018:  PET  Axumin scan revealed indeterminate activity in the prostate bed and no evidence of metastasis. 06/19/2020:  Bone scan revealed mild increased activity over the left greater trochanter, left hip series suggested to exclude a focal lesion. There was no abnormal bony uptake otherwise noted to suggest metastatic disease.   06/20/2020:  Hip films revealed no evidence of hip fracture or suspicious bone lesion. There were mild enthesopathic changes at the left greater trochanter which could potentially account for mildly increased uptake on recent bone scan. 07/02/2020:  PET Axumin scan on 07/02/2020 revealed intense radiotacer uptake in the LEFT prostate gland concerning for residual carcinoma.  There was no nodal metastasis or skeletal metastasis.    Appointment on 01/02/2021  Component Date Value Ref Range Status  . Sodium 01/02/2021 140  135 - 145 mmol/L Final  . Potassium 01/02/2021 4.6  3.5 - 5.1 mmol/L Final  . Chloride 01/02/2021 100  98 - 111 mmol/L Final  . CO2 01/02/2021 31  22 - 32 mmol/L Final  . Glucose, Bld 01/02/2021 97  70 - 99 mg/dL Final   Glucose reference range applies only to samples taken after fasting for at least 8 hours.  . BUN 01/02/2021 10  8 - 23 mg/dL Final  . Creatinine, Ser 01/02/2021 0.99  0.61 - 1.24 mg/dL Final  . Calcium 01/02/2021 9.5  8.9 - 10.3 mg/dL Final  . Total Protein 01/02/2021 7.1  6.5 - 8.1 g/dL Final  . Albumin 01/02/2021 4.2  3.5 - 5.0 g/dL Final  . AST 01/02/2021 22  15 - 41 U/L Final  . ALT 01/02/2021 16  0 - 44 U/L Final  . Alkaline Phosphatase 01/02/2021 44  38 - 126 U/L Final  . Total Bilirubin 01/02/2021 0.7  0.3 - 1.2 mg/dL Final  . GFR, Estimated 01/02/2021 >60  >60 mL/min Final   Comment: (NOTE) Calculated using the CKD-EPI Creatinine Equation (2021)   . Anion gap 01/02/2021 9  5 - 15 Final   Performed at [facility], [address] [place], [place] [address]  . WBC 01/02/2021 6.2  4.0 - 10.5 K/uL Final  . RBC 01/02/2021  3.96*  4.22 - 5.81 MIL/uL Final  . Hemoglobin 01/02/2021 12.4* 13.0 - 17.0 g/dL Final  . HCT 01/02/2021 38.2* 39.0 - 52.0 % Final  . MCV 01/02/2021 96.5  80.0 - 100.0 fL Final  . MCH 01/02/2021 31.3  26.0 - 34.0 pg Final  . MCHC 01/02/2021 32.5  30.0 - 36.0 g/dL Final  . RDW 01/02/2021 12.5  11.5 - 15.5 % Final  . Platelets 01/02/2021 204  150 - 400 K/uL Final  . nRBC 01/02/2021 0.0  0.0 - 0.2 % Final  . Neutrophils Relative % 01/02/2021 56  % Final  . Neutro Abs 01/02/2021 3.5  1.7 - 7.7 K/uL Final  . Lymphocytes Relative 01/02/2021 29  % Final  . Lymphs Abs 01/02/2021 1.8  0.7 - 4.0 K/uL Final  . Monocytes Relative 01/02/2021 8  % Final  . Monocytes Absolute 01/02/2021 0.5  0.1 - 1.0 K/uL Final  . Eosinophils Relative 01/02/2021 5  % Final  . Eosinophils Absolute 01/02/2021 0.3  0.0 - 0.5 K/uL Final  . Basophils Relative 01/02/2021 2  % Final  . Basophils Absolute 01/02/2021 0.1  0.0 - 0.1 K/uL Final  . Immature Granulocytes 01/02/2021 0  % Final  . Abs Immature Granulocytes 01/02/2021 0.02  0.00 - 0.07 K/uL Final   Performed at [facility], [address], [place], [place] [address]    Assessment:  James Lucas is a 80 y.o. male with prostate cancer diagnosed in 2009.  Gleason score was 4+3 in 2009.  He received cryoablation.  He developed a biochemical recurrence.  Prostate biopsy in 04/2009 revealed Gleason 5+4 adenocarcinoma.  He received IMRT with adjuvant hormonal therapy.  PSA nadir was < 0.1.  He has been on Lupron intermittently secondary to side effects. He received Lupron (45 mg; 6 month) on 09/12/2018.  He received Eliguard 45 mg on 07/11/2020.  Axumin PET scan on 09/09/2018 revealed indeterminate activity in the prostate bed and no evidence of metastasis.    Bone scan on 06/19/2020 revealed mild increased activity over the left greater trochanter, left hip series suggested to exclude a focal lesion. There was no abnormal bony uptake otherwise noted to  suggest metastatic disease.  Hip films on 06/20/2020 revealed no evidence of hip fracture or suspicious bone lesion. There were mild enthesopathic changes at the left greater trochanter which could potentially account for mildly increased uptake on recent bone scan.  PET Axumin scan on 07/02/2020 revealed intense radiotracer activity in the LEFT prostate gland concern for residual carcinoma.  There was no significant change from PET-CT 09/09/2018.  There was no nodal metastasis or skeletal metastasis.  He is s/p RIGHT nephrectomy.  Prostate biopsy on 08/22/2020 revealed prostatic adenocarcinoma in 9/12 (six from the left side; 3 from the right side) biopsies.  The tumor showed mixed morphology with some areas showing ADT treatment effect and other areas without significant treatment effect.  Pattern was Gleason 4 and 5 in areas of less treatment effect.  The background prostate was fibrotic c/w radiation effect.  PSA has been followed: 0.6 on 07/15/2012, 1.3 on 07/14/2013, 3.64 on 02/10/2016, 0.55 on 09/22/2016, 0.18 on 02/17/2017, 2.32 on 11/17/2017, 4.64 on 03/04/2018, 5.31 on 06/03/2018, 5.98 on 09/01/2018, 0.53 on 03/02/2019, 1.33 on 08/31/2019, 3.66 on 02/13/2020, 3.68 on 03/18/2020, 6.10 on 06/19/2020, 0.65 on 10/03/2020, and 0.55 on 01/02/2021.  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  He undergoes surveillance cystoscopy by  Dr. Alto [facility]).   Last cystoscopy was normal on 01/12/2019.  He has a normocytic anemia.  He follows a vegan diet.  He denies any melena or hematochezia.  He has B12 deficiency.  B12 was 96 on 03/16/2019 and 607 on 01/30/2020.  He is on oral B12.  Folate was 20.9 on 03/16/2019.   The patient received the second dose of the COVID-19 vaccine in 12/2019.  Symptomatically,  He feels "good." He still has sweats. He has pain in his fingers and left knee. He follows a vegan diet. He has takes oral  iron once daily (since 10/22/2020). He denies blood in the stool, black stools, and hematuria.  Exam is unremarkable.  Plan: 1.   Labs today: CBC with diff, CMP, ferritin, B12, folate, PSA. 2.   Prostate cancer  Patient initially diagnosed in 2009.  He received cryotherapy and IMRT.  With biochemical recurrence, he has received Lupron intermittently.  PET Axumin scan on 07/02/2020 revealed increased uptake in the left prostate.   Prostate biopsy on 08/22/2020 confirmed persistent disease.  He began [place] on 07/11/2020.  PSA was 6.10 on 06/19/2020 and 0.55 on 01/02/2021.  Patient would like to postpone today's Eligard. 3.   Iron deficiency anemia  Hematocrit 38.2.  Hemoglobin 12.4.  MCV 96.5 on 01/02/2021.     Ferritin is 38.  He denies any bleeding.  He is on a vegan diet. 4.   B12 deficiency  B12 was 607 on 01/30/2020 and 927 on 01/02/2021.    Folate was 16.6 on 01/02/2021.  Continue to monitor. 5.   No Eliguard today. 6.   RN:  Call patient with PSA. 7.   RTC in 3 months for MD assess, labs (CBC with diff, CMP, ferritin, PSA), and +/- Eliguard  I discussed the assessment and treatment plan with the patient.  The patient was provided an opportunity to ask questions and all were answered.  The patient agreed with the plan and demonstrated an understanding of the instructions.  The patient was advised to call back if the symptoms worsen or if the condition fails to improve as anticipated.   Nolon Stalls, MD, PhD    01/02/2021, 11:36 AM  I, Mirian Mo Tufford, am acting as a Education administrator for [employer]. Mike Gip, MD.   I, Javyon Fontan C. Mike Gip, MD, have reviewed the above documentation for accuracy and completeness, and I agree with the above.

## 2021-01-01 NOTE — Progress Notes (Signed)
Patient called/ pre- screened for virtual appoinment tomorrow with oncologist. Concerns of shortness of breath when walking, knee pain and chronic urinary problems.

## 2021-01-02 ENCOUNTER — Ambulatory Visit: Payer: Medicare Other | Admitting: Hematology and Oncology

## 2021-01-02 ENCOUNTER — Inpatient Hospital Stay: Payer: Medicare Other | Attending: Hematology and Oncology

## 2021-01-02 ENCOUNTER — Other Ambulatory Visit: Payer: Self-pay

## 2021-01-02 ENCOUNTER — Other Ambulatory Visit: Payer: Self-pay | Admitting: Hematology and Oncology

## 2021-01-02 ENCOUNTER — Inpatient Hospital Stay (HOSPITAL_BASED_OUTPATIENT_CLINIC_OR_DEPARTMENT_OTHER): Payer: Medicare Other | Admitting: Hematology and Oncology

## 2021-01-02 ENCOUNTER — Other Ambulatory Visit: Payer: Medicare Other

## 2021-01-02 ENCOUNTER — Encounter: Payer: Self-pay | Admitting: Hematology and Oncology

## 2021-01-02 VITALS — BP 146/78 | HR 62 | Temp 96.6°F | Resp 16 | Wt 229.6 lb

## 2021-01-02 DIAGNOSIS — Z905 Acquired absence of kidney: Secondary | ICD-10-CM | POA: Insufficient documentation

## 2021-01-02 DIAGNOSIS — Z7982 Long term (current) use of aspirin: Secondary | ICD-10-CM | POA: Insufficient documentation

## 2021-01-02 DIAGNOSIS — E538 Deficiency of other specified B group vitamins: Secondary | ICD-10-CM

## 2021-01-02 DIAGNOSIS — D509 Iron deficiency anemia, unspecified: Secondary | ICD-10-CM | POA: Diagnosis not present

## 2021-01-02 DIAGNOSIS — N400 Enlarged prostate without lower urinary tract symptoms: Secondary | ICD-10-CM | POA: Diagnosis not present

## 2021-01-02 DIAGNOSIS — Z87891 Personal history of nicotine dependence: Secondary | ICD-10-CM | POA: Insufficient documentation

## 2021-01-02 DIAGNOSIS — Z8551 Personal history of malignant neoplasm of bladder: Secondary | ICD-10-CM | POA: Diagnosis not present

## 2021-01-02 DIAGNOSIS — I10 Essential (primary) hypertension: Secondary | ICD-10-CM | POA: Diagnosis not present

## 2021-01-02 DIAGNOSIS — Z79899 Other long term (current) drug therapy: Secondary | ICD-10-CM | POA: Diagnosis not present

## 2021-01-02 DIAGNOSIS — C61 Malignant neoplasm of prostate: Secondary | ICD-10-CM

## 2021-01-02 LAB — COMPREHENSIVE METABOLIC PANEL
ALT: 16 U/L (ref 0–44)
AST: 22 U/L (ref 15–41)
Albumin: 4.2 g/dL (ref 3.5–5.0)
Alkaline Phosphatase: 44 U/L (ref 38–126)
Anion gap: 9 (ref 5–15)
BUN: 10 mg/dL (ref 8–23)
CO2: 31 mmol/L (ref 22–32)
Calcium: 9.5 mg/dL (ref 8.9–10.3)
Chloride: 100 mmol/L (ref 98–111)
Creatinine, Ser: 0.99 mg/dL (ref 0.61–1.24)
GFR, Estimated: >60 mL/min (ref >60)
Glucose, Bld: 97 mg/dL (ref 70–99)
Potassium: 4.6 mmol/L (ref 3.5–5.1)
Sodium: 140 mmol/L (ref 135–145)
Total Bilirubin: 0.7 mg/dL (ref 0.3–1.2)
Total Protein: 7.1 g/dL (ref 6.5–8.1)

## 2021-01-02 LAB — CBC WITH DIFFERENTIAL/PLATELET
Abs Immature Granulocytes: 0.02 10*3/uL (ref 0.00–0.07)
Basophils Absolute: 0.1 10*3/uL (ref 0.0–0.1)
Basophils Relative: 2 %
Eosinophils Absolute: 0.3 10*3/uL (ref 0.0–0.5)
Eosinophils Relative: 5 %
HCT: 38.2 % — ABNORMAL LOW (ref 39.0–52.0)
Hemoglobin: 12.4 g/dL — ABNORMAL LOW (ref 13.0–17.0)
Immature Granulocytes: 0 %
Lymphocytes Relative: 29 %
Lymphs Abs: 1.8 10*3/uL (ref 0.7–4.0)
MCH: 31.3 pg (ref 26.0–34.0)
MCHC: 32.5 g/dL (ref 30.0–36.0)
MCV: 96.5 fL (ref 80.0–100.0)
Monocytes Absolute: 0.5 10*3/uL (ref 0.1–1.0)
Monocytes Relative: 8 %
Neutro Abs: 3.5 10*3/uL (ref 1.7–7.7)
Neutrophils Relative %: 56 %
Platelets: 204 10*3/uL (ref 150–400)
RBC: 3.96 MIL/uL — ABNORMAL LOW (ref 4.22–5.81)
RDW: 12.5 % (ref 11.5–15.5)
WBC: 6.2 10*3/uL (ref 4.0–10.5)
nRBC: 0 % (ref 0.0–0.2)

## 2021-01-02 LAB — PSA: Prostatic Specific Antigen: 0.55 ng/mL (ref 0.00–4.00)

## 2021-01-02 LAB — FOLATE: Folate: 16.6 ng/mL (ref >5.9)

## 2021-01-02 LAB — VITAMIN B12: Vitamin B-12: 927 pg/mL — ABNORMAL HIGH (ref 180–914)

## 2021-01-02 LAB — FERRITIN: Ferritin: 38 ng/mL (ref 24–336)

## 2021-01-03 ENCOUNTER — Telehealth: Payer: Self-pay

## 2021-01-03 NOTE — Telephone Encounter (Signed)
I spoke with patient in regards of his recent lab work. Psa was 0.55 . Patient was excited to hear this.

## 2021-01-09 ENCOUNTER — Ambulatory Visit: Payer: Medicare Other | Admitting: Hematology and Oncology

## 2021-01-09 ENCOUNTER — Other Ambulatory Visit: Payer: Medicare Other

## 2021-01-15 ENCOUNTER — Ambulatory Visit: Payer: Medicare Other | Admitting: Radiation Oncology

## 2021-01-16 ENCOUNTER — Other Ambulatory Visit: Payer: Self-pay

## 2021-01-16 ENCOUNTER — Encounter: Payer: Self-pay | Admitting: Radiation Oncology

## 2021-01-16 ENCOUNTER — Ambulatory Visit
Admission: RE | Admit: 2021-01-16 | Discharge: 2021-01-16 | Disposition: A | Discharge status: Home / Self Care | Payer: Medicare Other | Source: Ambulatory Visit | Attending: Radiation Oncology | Admitting: Radiation Oncology

## 2021-01-16 VITALS — BP 137/78 | HR 76 | Temp 97.3°F | Wt 226.0 lb

## 2021-01-16 DIAGNOSIS — C61 Malignant neoplasm of prostate: Secondary | ICD-10-CM

## 2021-01-16 NOTE — Progress Notes (Signed)
Radiation Oncology Follow up Note  Name: James Lucas   Date:   01/16/2021 MRN:  [id number] DOB: 08-Oct-1941    This 80 y.o. male presents to the clinic today for 47-month follow-up status post.  IMRT radiation therapy 12 years prior status post failure after cryotherapy for Gleason 9 adenocarcinoma the prostate REFERRING PROVIDER: Ezequiel Kayser, MD  HPI: Patient is a 80 year old male treated back over 12 years prior with IMRT treatment for prostate cancer for Gleason 9 adenocarcinoma after cryotherapy failure.Marland Kitchen  He was referred back to me 6 months prior for other additional radiation therapy showing activity on PET scan in his prostate.  Based on his prior treatment I declined 1/3 therapy he is currently on pulse ADT therapy with his most recent PSA stable at 0.5.  He specifically denies any increased lower urinary tract symptoms diarrhea or fatigue.  He is followed by Dr. Mike Gip for normocytic anemia  COMPLICATIONS OF TREATMENT: none  FOLLOW UP COMPLIANCE: keeps appointments   PHYSICAL EXAM:  BP 137/78   Pulse 76   Temp (!) 97.3 F (36.3 C) (Tympanic)   Wt 226 lb (102.5 kg)   BMI 30.65 kg/m  Well-developed well-nourished patient in NAD. HEENT reveals PERLA, EOMI, discs not visualized.  Oral cavity is clear. No oral mucosal lesions are identified. Neck is clear without evidence of cervical or supraclavicular adenopathy. Lungs are clear to A&P. Cardiac examination is essentially unremarkable with regular rate and rhythm without murmur rub or thrill. Abdomen is benign with no organomegaly or masses noted. Motor sensory and DTR levels are equal and symmetric in the upper and lower extremities. Cranial nerves II through XII are grossly intact. Proprioception is intact. No peripheral adenopathy or edema is identified. No motor or sensory levels are noted. Crude visual fields are within normal range.  RADIOLOGY RESULTS: No current films to review  PLAN: Present time we will continue to observe  and monitor his PSA under Dr. Kem Parkinson direction.  I have asked to see him back in 6 months for follow-up.  He continues on ADT therapy under her direction.  Patient knows to call with any concerns.  I would like to take this opportunity to thank you for allowing me to participate in the care of your patient.Noreene Filbert, MD

## 2021-03-26 ENCOUNTER — Other Ambulatory Visit: Payer: Self-pay

## 2021-03-26 DIAGNOSIS — C61 Malignant neoplasm of prostate: Secondary | ICD-10-CM

## 2021-03-28 ENCOUNTER — Other Ambulatory Visit: Payer: Self-pay

## 2021-03-28 DIAGNOSIS — C61 Malignant neoplasm of prostate: Secondary | ICD-10-CM

## 2021-04-01 ENCOUNTER — Inpatient Hospital Stay: Payer: Medicare Other

## 2021-04-01 ENCOUNTER — Inpatient Hospital Stay (HOSPITAL_BASED_OUTPATIENT_CLINIC_OR_DEPARTMENT_OTHER): Payer: Medicare Other | Admitting: Nurse Practitioner

## 2021-04-01 ENCOUNTER — Inpatient Hospital Stay: Payer: Medicare Other | Attending: Nurse Practitioner

## 2021-04-01 ENCOUNTER — Other Ambulatory Visit: Payer: Self-pay

## 2021-04-01 ENCOUNTER — Encounter: Payer: Self-pay | Admitting: Nurse Practitioner

## 2021-04-01 VITALS — BP 139/71 | HR 70 | Temp 97.0°F | Resp 18 | Wt 223.0 lb

## 2021-04-01 DIAGNOSIS — Z5111 Encounter for antineoplastic chemotherapy: Secondary | ICD-10-CM | POA: Diagnosis present

## 2021-04-01 DIAGNOSIS — C61 Malignant neoplasm of prostate: Secondary | ICD-10-CM

## 2021-04-01 DIAGNOSIS — Z923 Personal history of irradiation: Secondary | ICD-10-CM | POA: Diagnosis not present

## 2021-04-01 LAB — COMPREHENSIVE METABOLIC PANEL
ALT: 13 U/L (ref 0–44)
AST: 19 U/L (ref 15–41)
Albumin: 4.2 g/dL (ref 3.5–5.0)
Alkaline Phosphatase: 51 U/L (ref 38–126)
Anion gap: 7 (ref 5–15)
BUN: 10 mg/dL (ref 8–23)
CO2: 30 mmol/L (ref 22–32)
Calcium: 9.2 mg/dL (ref 8.9–10.3)
Chloride: 101 mmol/L (ref 98–111)
Creatinine, Ser: 0.96 mg/dL (ref 0.61–1.24)
GFR, Estimated: >60 mL/min (ref >60)
Glucose, Bld: 99 mg/dL (ref 70–99)
Potassium: 4 mmol/L (ref 3.5–5.1)
Sodium: 138 mmol/L (ref 135–145)
Total Bilirubin: 0.9 mg/dL (ref 0.3–1.2)
Total Protein: 7.2 g/dL (ref 6.5–8.1)

## 2021-04-01 LAB — CBC WITH DIFFERENTIAL/PLATELET
Abs Immature Granulocytes: 0.01 10*3/uL (ref 0.00–0.07)
Basophils Absolute: 0.1 10*3/uL (ref 0.0–0.1)
Basophils Relative: 1 %
Eosinophils Absolute: 0.6 10*3/uL — ABNORMAL HIGH (ref 0.0–0.5)
Eosinophils Relative: 8 %
HCT: 37.1 % — ABNORMAL LOW (ref 39.0–52.0)
Hemoglobin: 12.2 g/dL — ABNORMAL LOW (ref 13.0–17.0)
Immature Granulocytes: 0 %
Lymphocytes Relative: 29 %
Lymphs Abs: 2.2 10*3/uL (ref 0.7–4.0)
MCH: 31.4 pg (ref 26.0–34.0)
MCHC: 32.9 g/dL (ref 30.0–36.0)
MCV: 95.6 fL (ref 80.0–100.0)
Monocytes Absolute: 0.6 10*3/uL (ref 0.1–1.0)
Monocytes Relative: 8 %
Neutro Abs: 4.1 10*3/uL (ref 1.7–7.7)
Neutrophils Relative %: 54 %
Platelets: 217 10*3/uL (ref 150–400)
RBC: 3.88 MIL/uL — ABNORMAL LOW (ref 4.22–5.81)
RDW: 12.5 % (ref 11.5–15.5)
WBC: 7.6 10*3/uL (ref 4.0–10.5)
nRBC: 0 % (ref 0.0–0.2)

## 2021-04-01 LAB — PSA: Prostatic Specific Antigen: 0.33 ng/mL (ref 0.00–4.00)

## 2021-04-01 LAB — FERRITIN: Ferritin: 73 ng/mL (ref 24–336)

## 2021-04-01 NOTE — Progress Notes (Signed)
[facility]  [address], [address] [place],  [address] Phone: [phone]  Fax: [phone]   Clinic Day:  04/01/2021   Referring physician: Ezequiel Kayser, MD  Chief Complaint: James Lucas is a 80 y.o. male with a long-standing history of prostate cancer who is seen for 3 month assessment.  HPI:   James Lucas is a 80 y.o. male with prostate cancer diagnosed in 2009.  Gleason score was 4+3 in 2009.  He received cryoablation.  He developed a biochemical recurrence.  Prostate biopsy in 04/2009 revealed Gleason 5+4 adenocarcinoma.  He received IMRT with adjuvant hormonal therapy.  PSA nadir was < 0.1.  He has been on Lupron intermittently secondary to side effects. He received Lupron (45 mg; 6 month) on 09/12/2018.  He received Eliguard 45 mg on 07/11/2020.  Axumin PET scan on 09/09/2018 revealed indeterminate activity in the prostate bed and no evidence of metastasis.    Bone scan on 06/19/2020 revealed mild increased activity over the left greater trochanter, left hip series suggested to exclude a focal lesion. There was no abnormal bony uptake otherwise noted to suggest metastatic disease.  Hip films on 06/20/2020 revealed no evidence of hip fracture or suspicious bone lesion. There were mild enthesopathic changes at the left greater trochanter which could potentially account for mildly increased uptake on recent bone scan.  PET Axumin scan on 07/02/2020 revealed intense radiotracer activity in the LEFT prostate gland concern for residual carcinoma.  There was no significant change from PET-CT 09/09/2018.  There was no nodal metastasis or skeletal metastasis.  He is s/p RIGHT nephrectomy.  Prostate biopsy on 08/22/2020 revealed prostatic adenocarcinoma in 9/12 (six from the left side; 3 from the right side) biopsies.  The tumor showed mixed morphology with some areas showing ADT treatment effect and other areas without significant treatment effect.   Pattern was Gleason 4 and 5 in areas of less treatment effect.  The background prostate was fibrotic c/w radiation effect.  PSA has been followed: 0.6 on 07/15/2012, 1.3 on 07/14/2013, 3.64 on 02/10/2016, 0.55 on 09/22/2016, 0.18 on 02/17/2017, 2.32 on 11/17/2017, 4.64 on 03/04/2018, 5.31 on 06/03/2018, 5.98 on 09/01/2018, 0.53 on 03/02/2019, 1.33 on 08/31/2019, 3.66 on 02/13/2020, 3.68 on 03/18/2020, 6.10 on 06/19/2020, 0.65 on 10/03/2020, and 0.55 on 01/02/2021.  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  He undergoes surveillance cystoscopy by Dr. Alto Denver [facility]).   Last cystoscopy was normal on 01/12/2019.  He has a normocytic anemia.  He follows a vegan diet.  He denies any melena or hematochezia.  He has B12 deficiency.  B12 was 96 on 03/16/2019 and 607 on 01/30/2020.  He is on oral B12.  Folate was 20.9 on 03/16/2019.   The patient received the second dose of the COVID-19 vaccine in 12/2019.  Interval History: Patient returns to clinic for follow up and consideration of eligard. He feels well. Denies any neurologic complaints. Denies recent fevers or illnesses. Denies any easy bleeding or bruising. No melena or hematochezia. Reports good appetite and denies weight loss. Denies chest pain. Denies any nausea, vomiting, constipation, or diarrhea. Denies urinary complaints. Patient offers no further specific complaints today.    Past Medical History:  Diagnosis Date   Allergic rhinitis    Bladder cancer (HCC)    BPH (benign prostatic hyperplasia)    Chronic back pain    Essential hypertension    Glaucoma    Impotence  of organic origin    Liver laceration    Prostate cancer ([facility])    Stenosis, spinal, lumbar     Past Surgical History:  Procedure Laterality Date   BACK SURGERY     COLONOSCOPY     COLONOSCOPY WITH PROPOFOL N/A 08/24/2019   Procedure: COLONOSCOPY WITH PROPOFOL;  Surgeon: Lollie Sails, MD;   Location: [facility];  Service: Endoscopy;  Laterality: N/A;   HERNIA REPAIR     NEPHRECTOMY Right 1978   TRANSURETHRAL RESECTION OF BLADDER TUMOR WITH GYRUS (TURBT-GYRUS)      Family History  Problem Relation Age of Onset   Cancer Father     Social History:  reports that he quit smoking about 57 years ago. He has never used smokeless tobacco. He reports that he does not drink alcohol and does not use drugs. He is a former smoker and smoked several packs per day in his early 41s but has not smoked in over 47 years. He does not drink alcohol. He is retired but used to work for [employer]. He denies known exposure to radiation or toxins. The patient is alone today.   Allergies:  Allergies  Allergen Reactions   Ace Inhibitors Cough    Current Medications: Current Outpatient Medications  Medication Sig Dispense Refill   albuterol (VENTOLIN HFA) 108 (90 Base) MCG/ACT inhaler Inhale 2 puffs into the lungs every 4 (four) hours as needed for wheezing or shortness of breath. (Patient not taking: No sig reported)     aspirin EC 81 MG tablet Take 81 mg by mouth daily.      ferrous sulfate 325 (65 FE) MG tablet Take by mouth.     fluticasone (FLONASE) 50 MCG/ACT nasal spray Place 1 spray into both nostrils daily as needed.     hydrochlorothiazide (HYDRODIURIL) 25 MG tablet Take 25 mg by mouth daily.      latanoprost (XALATAN) 0.005 % ophthalmic solution Place 1 drop into both eyes at bedtime.      montelukast (SINGULAIR) 10 MG tablet Take 10 mg by mouth daily.      vitamin B-12 (CYANOCOBALAMIN) 1000 MCG tablet Take 1,000 mcg by mouth daily.      No current facility-administered medications for this visit.    Review of Systems  Constitutional:  Negative for chills, fever, malaise/fatigue and weight loss.  HENT:  Negative for hearing loss, nosebleeds, sore throat and tinnitus.   Eyes:  Negative for blurred vision and double vision.  Respiratory:  Negative for cough, hemoptysis, shortness of  breath and wheezing.   Cardiovascular:  Negative for chest pain, palpitations and leg swelling.  Gastrointestinal:  Negative for abdominal pain, blood in stool, constipation, diarrhea, melena, nausea and vomiting.  Genitourinary:  Negative for dysuria and urgency.  Musculoskeletal:  Negative for back pain, falls, joint pain and myalgias.  Skin:  Negative for itching and rash.  Neurological:  Negative for dizziness, tingling, sensory change, loss of consciousness, weakness and headaches.  Endo/Heme/Allergies:  Negative for environmental allergies. Does not bruise/bleed easily.  Psychiatric/Behavioral:  Negative for depression. The patient is not nervous/anxious and does not have insomnia.   Performance status (ECOG): 1  Vital Signs: There were no vitals taken for this visit.  General: Well-developed, well-nourished, no acute distress. Eyes: Pink conjunctiva, anicteric sclera. Lungs: Clear to auscultation bilaterally.  No audible wheezing or coughing Heart: Regular rate and rhythm.  Abdomen: Soft, nontender, nondistended.  Musculoskeletal: No edema, cyanosis, or clubbing. Neuro: Alert, answering all questions appropriately. Cranial nerves grossly  intact. Skin: No rashes or petechiae noted. Psych: Normal affect.   Imaging studies: 05/23/2014:  Prostate MRI revealed areas suspicious for extracapsular extension in the right posterior apex and along the posterior aspect of the anterior urethra.  05/01/2016:  Abdomen and pelvis CT revealed no evidence of recurrent or metastatic disease. Bone scan on 05/01/2016 showed no evidence of osseous metastatic disease. 12/02/2017:  PET Axumin scan revealed potential local recurrence in the residual left prostate gland, with the region of activity relatively small at approximately 1.5 cm. No evidence of metastatic disease in the abdomen pelvis or skeleton.  09/09/2018:  PET Axumin scan revealed indeterminate activity in the prostate bed and no evidence of  metastasis. 06/19/2020:  Bone scan revealed mild increased activity over the left greater trochanter, left hip series suggested to exclude a focal lesion. There was no abnormal bony uptake otherwise noted to suggest metastatic disease.   06/20/2020:  Hip films revealed no evidence of hip fracture or suspicious bone lesion. There were mild enthesopathic changes at the left greater trochanter which could potentially account for mildly increased uptake on recent bone scan. 07/02/2020:  PET Axumin scan on 07/02/2020 revealed intense radiotacer uptake in the LEFT prostate gland concerning for residual carcinoma.  There was no nodal metastasis or skeletal metastasis.    No visits with results within 3 Day(s) from this visit.  Latest known visit with results is:  Appointment on 01/02/2021  Component Date Value Ref Range Status   Folate 01/02/2021 16.6  >5.9 ng/mL Final   Performed at [facility], [address] [place], [place] [address]   Vitamin B-12 01/02/2021 927* 180 - 914 pg/mL Final   Comment: (NOTE) This assay is not validated for testing neonatal or myeloproliferative syndrome specimens for Vitamin B12 levels. Performed at [facility], [place] [address] [place], [place] [address]    Prostatic Specific Antigen 01/02/2021 0.55  0.00 - 4.00 ng/mL Final   Comment: (NOTE) While PSA levels of <=4.0 ng/ml are reported as reference range, some men with levels below 4.0 ng/ml can have prostate cancer and many men with PSA above 4.0 ng/ml do not have prostate cancer.  Other tests such as free PSA, age specific reference ranges, PSA velocity and PSA doubling time may be helpful especially in men less than 4 years old. Performed at [facility], [place] [address] [place], [place] [address]    Ferritin 01/02/2021 38  24 - 336 ng/mL Final   Performed at [facility], [place], [place] [address]   Sodium 01/02/2021 140  135 - 145 mmol/L Final    Potassium 01/02/2021 4.6  3.5 - 5.1 mmol/L Final   Chloride 01/02/2021 100  98 - 111 mmol/L Final   CO2 01/02/2021 31  22 - 32 mmol/L Final   Glucose, Bld 01/02/2021 97  70 - 99 mg/dL Final   Glucose reference range applies only to samples taken after fasting for at least 8 hours.   BUN 01/02/2021 10  8 - 23 mg/dL Final   Creatinine, Ser 01/02/2021 0.99  0.61 - 1.24 mg/dL Final   Calcium 01/02/2021 9.5  8.9 - 10.3 mg/dL Final   Total Protein 01/02/2021 7.1  6.5 - 8.1 g/dL Final   Albumin 01/02/2021 4.2  3.5 - 5.0 g/dL Final   AST 01/02/2021 22  15 - 41 U/L Final   ALT 01/02/2021 16  0 - 44 U/L Final   Alkaline Phosphatase 01/02/2021 44  38 -  126 U/L Final   Total Bilirubin 01/02/2021 0.7  0.3 - 1.2 mg/dL Final   GFR, Estimated 01/02/2021 >60  >60 mL/min Final   Comment: (NOTE) Calculated using the CKD-EPI Creatinine Equation (2021)    Anion gap 01/02/2021 9  5 - 15 Final   Performed at [facility], [address], [place], [place] [address]   WBC 01/02/2021 6.2  4.0 - 10.5 K/uL Final   RBC 01/02/2021 3.96* 4.22 - 5.81 MIL/uL Final   Hemoglobin 01/02/2021 12.4* 13.0 - 17.0 g/dL Final   HCT 01/02/2021 38.2* 39.0 - 52.0 % Final   MCV 01/02/2021 96.5  80.0 - 100.0 fL Final   MCH 01/02/2021 31.3  26.0 - 34.0 pg Final   MCHC 01/02/2021 32.5  30.0 - 36.0 g/dL Final   RDW 01/02/2021 12.5  11.5 - 15.5 % Final   Platelets 01/02/2021 204  150 - 400 K/uL Final   nRBC 01/02/2021 0.0  0.0 - 0.2 % Final   Neutrophils Relative % 01/02/2021 56  % Final   Neutro Abs 01/02/2021 3.5  1.7 - 7.7 K/uL Final   Lymphocytes Relative 01/02/2021 29  % Final   Lymphs Abs 01/02/2021 1.8  0.7 - 4.0 K/uL Final   Monocytes Relative 01/02/2021 8  % Final   Monocytes Absolute 01/02/2021 0.5  0.1 - 1.0 K/uL Final   Eosinophils Relative 01/02/2021 5  % Final   Eosinophils Absolute 01/02/2021 0.3  0.0 - 0.5 K/uL Final   Basophils Relative 01/02/2021 2  % Final   Basophils Absolute 01/02/2021 0.1  0.0  - 0.1 K/uL Final   Immature Granulocytes 01/02/2021 0  % Final   Abs Immature Granulocytes 01/02/2021 0.02  0.00 - 0.07 K/uL Final   Performed at [facility], [address], [place], [place] [address]    Assessment:  Prostate Cancer- diagnosed 2009 s/p cryotherapy and IMRT with biopchemical recurrence. Currently receiving Lupron/Eligard every 6 months. PET 07/02/20 revealed increased uptake in left prostate. Prostate biopsy on 08/22/20 confirmed persistent disease. PSA today 0.33. Clinically stale. Labs reviewed. Continue with eligard today.   2.   Iron deficiency anemia- no evidence of bleeding. Ferritin 73. Monitor.   3.   B12 deficiency  B12 was 607 on 01/30/2020 and 927 on 01/02/2021.    Folate was 16.6 on 01/02/2021.  Continue to monitor.  Plan: Eligard tomorrow  RTC in 3 months for labs (cbc, cmp, psa) then few days later, MD to establish care, +/- eligard  I discussed the assessment and treatment plan with the patient.  The patient was provided an opportunity to ask questions and all were answered.  The patient agreed with the plan and demonstrated an understanding of the instructions.  The patient was advised to call back if the symptoms worsen or if the condition fails to improve as anticipated.  Beckey Rutter, DNP, AGNP-C [place] at [facility] [phone] (clinic)

## 2021-04-02 ENCOUNTER — Inpatient Hospital Stay: Payer: Medicare Other

## 2021-04-02 VITALS — BP 140/79 | HR 68 | Temp 97.0°F | Resp 18

## 2021-04-02 DIAGNOSIS — Z5111 Encounter for antineoplastic chemotherapy: Secondary | ICD-10-CM | POA: Diagnosis not present

## 2021-04-02 DIAGNOSIS — C61 Malignant neoplasm of prostate: Secondary | ICD-10-CM

## 2021-04-02 MED ORDER — LEUPROLIDE ACETATE (6 MONTH) 45 MG ~~LOC~~ KIT
45.0000 mg | PACK | Freq: Once | SUBCUTANEOUS | Status: AC
Start: 2021-04-02 — End: 2021-04-02
  Administered 2021-04-02: 45 mg via SUBCUTANEOUS
  Filled 2021-04-02: qty 45

## 2021-04-03 ENCOUNTER — Encounter: Payer: Self-pay | Admitting: *Deleted

## 2021-05-26 ENCOUNTER — Encounter: Payer: Self-pay | Admitting: Oncology

## 2021-07-02 ENCOUNTER — Inpatient Hospital Stay: Payer: Medicare Other | Attending: Nurse Practitioner

## 2021-07-02 ENCOUNTER — Other Ambulatory Visit: Payer: Medicare Other

## 2021-07-02 DIAGNOSIS — D509 Iron deficiency anemia, unspecified: Secondary | ICD-10-CM | POA: Diagnosis not present

## 2021-07-02 DIAGNOSIS — C61 Malignant neoplasm of prostate: Secondary | ICD-10-CM | POA: Diagnosis present

## 2021-07-02 DIAGNOSIS — I10 Essential (primary) hypertension: Secondary | ICD-10-CM | POA: Diagnosis not present

## 2021-07-02 LAB — COMPREHENSIVE METABOLIC PANEL
ALT: 16 U/L (ref 0–44)
AST: 23 U/L (ref 15–41)
Albumin: 4.3 g/dL (ref 3.5–5.0)
Alkaline Phosphatase: 48 U/L (ref 38–126)
Anion gap: 8 (ref 5–15)
BUN: 12 mg/dL (ref 8–23)
CO2: 32 mmol/L (ref 22–32)
Calcium: 9.2 mg/dL (ref 8.9–10.3)
Chloride: 99 mmol/L (ref 98–111)
Creatinine, Ser: 0.97 mg/dL (ref 0.61–1.24)
GFR, Estimated: >60 mL/min (ref >60)
Glucose, Bld: 106 mg/dL — ABNORMAL HIGH (ref 70–99)
Potassium: 4.7 mmol/L (ref 3.5–5.1)
Sodium: 139 mmol/L (ref 135–145)
Total Bilirubin: 0.7 mg/dL (ref 0.3–1.2)
Total Protein: 7.3 g/dL (ref 6.5–8.1)

## 2021-07-02 LAB — CBC WITH DIFFERENTIAL/PLATELET
Abs Immature Granulocytes: 0.01 10*3/uL (ref 0.00–0.07)
Basophils Absolute: 0.1 10*3/uL (ref 0.0–0.1)
Basophils Relative: 1 %
Eosinophils Absolute: 0.8 10*3/uL — ABNORMAL HIGH (ref 0.0–0.5)
Eosinophils Relative: 11 %
HCT: 39.4 % (ref 39.0–52.0)
Hemoglobin: 12.7 g/dL — ABNORMAL LOW (ref 13.0–17.0)
Immature Granulocytes: 0 %
Lymphocytes Relative: 28 %
Lymphs Abs: 2.1 10*3/uL (ref 0.7–4.0)
MCH: 31.8 pg (ref 26.0–34.0)
MCHC: 32.2 g/dL (ref 30.0–36.0)
MCV: 98.5 fL (ref 80.0–100.0)
Monocytes Absolute: 0.5 10*3/uL (ref 0.1–1.0)
Monocytes Relative: 7 %
Neutro Abs: 4 10*3/uL (ref 1.7–7.7)
Neutrophils Relative %: 53 %
Platelets: 215 10*3/uL (ref 150–400)
RBC: 4 MIL/uL — ABNORMAL LOW (ref 4.22–5.81)
RDW: 12 % (ref 11.5–15.5)
WBC: 7.5 10*3/uL (ref 4.0–10.5)
nRBC: 0 % (ref 0.0–0.2)

## 2021-07-02 LAB — PSA: Prostatic Specific Antigen: 0.31 ng/mL (ref 0.00–4.00)

## 2021-07-03 ENCOUNTER — Inpatient Hospital Stay: Payer: Medicare Other

## 2021-07-03 ENCOUNTER — Encounter: Payer: Self-pay | Admitting: Oncology

## 2021-07-03 ENCOUNTER — Other Ambulatory Visit: Payer: Self-pay | Admitting: Oncology

## 2021-07-03 ENCOUNTER — Inpatient Hospital Stay (HOSPITAL_BASED_OUTPATIENT_CLINIC_OR_DEPARTMENT_OTHER): Payer: Medicare Other | Admitting: Oncology

## 2021-07-03 ENCOUNTER — Other Ambulatory Visit: Payer: Self-pay

## 2021-07-03 VITALS — BP 137/69 | HR 80 | Temp 97.7°F | Resp 18 | Wt 226.5 lb

## 2021-07-03 DIAGNOSIS — IMO0001 Reserved for inherently not codable concepts without codable children: Secondary | ICD-10-CM

## 2021-07-03 DIAGNOSIS — Z79818 Long term (current) use of other agents affecting estrogen receptors and estrogen levels: Secondary | ICD-10-CM | POA: Diagnosis not present

## 2021-07-03 DIAGNOSIS — C61 Malignant neoplasm of prostate: Secondary | ICD-10-CM | POA: Diagnosis not present

## 2021-07-03 DIAGNOSIS — D509 Iron deficiency anemia, unspecified: Secondary | ICD-10-CM

## 2021-07-03 MED ORDER — PREDNISONE 5 MG PO TABS: 5.0000 mg | ORAL_TABLET | Freq: Every day | ORAL | 0 refills | Status: DC

## 2021-07-03 MED ORDER — ABIRATERONE ACETATE 250 MG PO TABS: 750.0000 mg | ORAL_TABLET | Freq: Every day | ORAL | 0 refills | Status: DC

## 2021-07-03 NOTE — Progress Notes (Signed)
Hematology Oncology Progress Note   Clinic Day:  07/03/2021   Referring physician: Ezequiel Kayser, MD  Chief Complaint: James Lucas is a 80 y.o. male with a long-standing history of prostate cancer who presents for follow up.   PERTINENT ONCOLOGY HISTORY Patient previously followed up by Dr.Corcoran, patient switched care to me on 07/03/21 Extensive medical record review was performed by me Per note,  # 2009 prostate cancer .  Gleason score was 4+3 in 2009.  He received cryoablation. # 2010 PSA rose to 1.4, biochemical recurrence.  Prostate biopsy in 04/2009 revealed Gleason 5+4 adenocarcinoma.  He received IMRT with adjuvant androgen deprivation therapy.  PSA nadir was < 0.1.  05/23/2014  Prostate MRI revealed areas suspicious for extracapsular extension in the right posterior apex and along the posterior aspect of the anterior urethra.  ADT was discussed but due to history of side effects he elected to continue surveillance.  #2016 PSA increased and he was started on Lupron, intermittently secondary to side effects.  # 01/2016 PSA increased to 3.64 05/01/2016 CT revealed no evidence of recurrent or metastatic disease. Bone scan on 05/01/2016 showed no evidence of osseous metastatic disease # 07/10/2016 Seen by Dr.Finnegan, started on Leupron.  # 10/26/2016 Dr. Beryle Lathe at [facility] recommended discontinuation of Lupron as his PSA was < 4.0. He was to pursue intermittent therapy when PSA increased to about 10.  Testosterone and PSA checks were recommended every 2 months and PET/CT scan with F-18 fluciclovine to assess for oligomenorrhea static disease if PSA began to rise.  12/02/2017 Axumin PET scan revealed potential local recurrence in the residual left prostate gland, with the region of activity relatively small at approximately 1.5 cm. No evidence of metastatic disease in the abdomen pelvis or skeleton 09/02/2018 by Dr. Grayland Ormond.  At that time, PSA was 5.98.  He agreed to reinitiate  Lupron (received 09/12/2018).  07/02/2020 PET Axumin scan on  revealed intense radiotracer activity in the LEFT prostate gland concern for residual carcinoma.  There was no significant change from PET-CT 09/09/2018.  There was no nodal metastasis or skeletal metastasis.  He is s/p RIGHT nephrectomy.  08/22/2020 revealed prostatic adenocarcinoma in 9/12 (six from the left side; 3 from the right side) biopsies.  The tumor showed mixed morphology with some areas showing ADT treatment effect and other areas without significant treatment effect.  Pattern was Gleason 4 and 5 in areas of less treatment effect.  The background prostate was fibrotic c/w radiation effect.   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  He undergoes surveillance cystoscopy by Dr. Alto [facility]).   Last cystoscopy was normal on 01/12/2019.   Interval History:  INTERVAL HISTORY James Lucas is a 80 y.o. male who has above history reviewed by me today presents for follow up visit for management of prostate cancer He is on Eligard '45mg'$  Q6 months. Last received on 04/02/2021  Manageable side effects with chronic fatigue, hot flash and sweating.  He also has chronic arthralgia  Review of Systems  Constitutional:  Positive for fatigue. Negative for appetite change, chills, fever and unexpected weight change.  HENT:   Negative for hearing loss and voice change.   Eyes:  Negative for eye problems and icterus.  Respiratory:  Negative for chest tightness, cough and shortness of breath.   Cardiovascular:  Negative for chest pain and leg swelling.  Gastrointestinal:  Negative for abdominal distention and abdominal pain.  Endocrine: Positive for hot flashes.  Genitourinary:  Negative for difficulty urinating, dysuria and frequency.   Skin:  Negative for itching and rash.  Neurological:  Negative for light-headedness and numbness.  Hematological:  Negative  for adenopathy. Does not bruise/bleed easily.  Psychiatric/Behavioral:  Negative for confusion.     Past Medical History:  Diagnosis Date   Allergic rhinitis    Bladder cancer (HCC)    BPH (benign prostatic hyperplasia)    Chronic back pain    Essential hypertension    Glaucoma    Impotence of organic origin    Liver laceration    Prostate cancer ([place])    Stenosis, spinal, lumbar     Past Surgical History:  Procedure Laterality Date   BACK SURGERY     COLONOSCOPY     COLONOSCOPY WITH PROPOFOL N/A 08/24/2019   Procedure: COLONOSCOPY WITH PROPOFOL;  Surgeon: Lollie Sails, MD;  Location: [facility] ENDOSCOPY;  Service: Endoscopy;  Laterality: N/A;   HERNIA REPAIR     NEPHRECTOMY Right 1978   TRANSURETHRAL RESECTION OF BLADDER TUMOR WITH GYRUS (TURBT-GYRUS)      Family History  Problem Relation Age of Onset   Cancer Father     Social History:  reports that he quit smoking about 57 years ago. He has never used smokeless tobacco. He reports that he does not drink alcohol and does not use drugs. He is a former smoker and smoked several packs per day in his early 50s but has not smoked in over 82 years. He does not drink alcohol. He is retired but used to work for [employer]. He denies known exposure to radiation or toxins. The patient is alone today.   Allergies:  Allergies  Allergen Reactions   Ace Inhibitors Cough    Current Medications: Current Outpatient Medications  Medication Sig Dispense Refill   aspirin EC 81 MG tablet Take 81 mg by mouth daily.      ferrous sulfate 325 (65 FE) MG tablet Take by mouth.     fluticasone (FLONASE) 50 MCG/ACT nasal spray Place 1 spray into both nostrils daily as needed.     hydrochlorothiazide (HYDRODIURIL) 25 MG tablet Take 25 mg by mouth daily.      latanoprost (XALATAN) 0.005 % ophthalmic solution Place 1 drop into both eyes at bedtime.      montelukast (SINGULAIR) 10 MG tablet Take 10 mg by mouth daily.      Multiple Vitamin (MULTI-VITAMIN)  tablet Take 1 tablet by mouth daily.     vitamin B-12 (CYANOCOBALAMIN) 1000 MCG tablet Take 1,000 mcg by mouth daily.      albuterol (VENTOLIN HFA) 108 (90 Base) MCG/ACT inhaler Inhale 2 puffs into the lungs every 4 (four) hours as needed for wheezing or shortness of breath. (Patient not taking: No sig reported)     No current facility-administered medications for this visit.     Performance status (ECOG): 1  Vital Signs: Blood pressure 137/69, pulse 80, temperature 97.7 F (36.5 C), resp. rate 18, weight 226 lb 8.4 oz (102.7 kg), SpO2 99 %. Physical Exam Constitutional:      General: He is not in acute distress.    Appearance: He is not diaphoretic.  HENT:     Head: Normocephalic and atraumatic.     Nose: Nose normal.     Mouth/Throat:     Pharynx: No oropharyngeal exudate.  Eyes:     General: No scleral icterus.    Pupils: Pupils are equal, round, and reactive to  light.  Cardiovascular:     Rate and Rhythm: Normal rate and regular rhythm.     Heart sounds: No murmur heard. Pulmonary:     Effort: Pulmonary effort is normal. No respiratory distress.     Breath sounds: No rales.  Chest:     Chest wall: No tenderness.  Abdominal:     General: There is no distension.     Palpations: Abdomen is soft.     Tenderness: There is no abdominal tenderness.  Musculoskeletal:        General: Normal range of motion.     Cervical back: Normal range of motion and neck supple.  Skin:    General: Skin is warm and dry.     Findings: No erythema.  Neurological:     Mental Status: He is alert and oriented to person, place, and time.     Cranial Nerves: No cranial nerve deficit.     Motor: No abnormal muscle tone.     Coordination: Coordination normal.  Psychiatric:        Mood and Affect: Affect normal.     Appointment on 07/02/2021  Component Date Value Ref Range Status   Prostatic Specific Antigen 07/02/2021 0.31  0.00 - 4.00 ng/mL Final   Comment: (NOTE) While PSA levels of  <=4.0 ng/ml are reported as reference range, some men with levels below 4.0 ng/ml can have prostate cancer and many men with PSA above 4.0 ng/ml do not have prostate cancer.  Other tests such as free PSA, age specific reference ranges, PSA velocity and PSA doubling time may be helpful especially in men less than 13 years old. Performed at [facility], [place] [address], [place], [place] [address]    Sodium 07/02/2021 139  135 - 145 mmol/L Final   Potassium 07/02/2021 4.7  3.5 - 5.1 mmol/L Final   Chloride 07/02/2021 99  98 - 111 mmol/L Final   CO2 07/02/2021 32  22 - 32 mmol/L Final   Glucose, Bld 07/02/2021 106 (A) 70 - 99 mg/dL Final   Glucose reference range applies only to samples taken after fasting for at least 8 hours.   BUN 07/02/2021 12  8 - 23 mg/dL Final   Creatinine, Ser 07/02/2021 0.97  0.61 - 1.24 mg/dL Final   Calcium 07/02/2021 9.2  8.9 - 10.3 mg/dL Final   Total Protein 07/02/2021 7.3  6.5 - 8.1 g/dL Final   Albumin 07/02/2021 4.3  3.5 - 5.0 g/dL Final   AST 07/02/2021 23  15 - 41 U/L Final   ALT 07/02/2021 16  0 - 44 U/L Final   Alkaline Phosphatase 07/02/2021 48  38 - 126 U/L Final   Total Bilirubin 07/02/2021 0.7  0.3 - 1.2 mg/dL Final   GFR, Estimated 07/02/2021 >60  >60 mL/min Final   Comment: (NOTE) Calculated using the CKD-EPI Creatinine Equation (2021)    Anion gap 07/02/2021 8  5 - 15 Final   Performed at [facility], [place], [place] [address]   WBC 07/02/2021 7.5  4.0 - 10.5 K/uL Final   RBC 07/02/2021 4.00 (A) 4.22 - 5.81 MIL/uL Final   Hemoglobin 07/02/2021 12.7 (A) 13.0 - 17.0 g/dL Final   HCT 07/02/2021 39.4  39.0 - 52.0 % Final   MCV 07/02/2021 98.5  80.0 - 100.0 fL Final   MCH 07/02/2021 31.8  26.0 - 34.0 pg Final   MCHC 07/02/2021 32.2  30.0 - 36.0 g/dL Final   RDW 07/02/2021  12.0  11.5 - 15.5 % Final   Platelets 07/02/2021 215  150 - 400 K/uL Final   nRBC 07/02/2021 0.0  0.0 - 0.2 % Final   Neutrophils Relative %  07/02/2021 53  % Final   Neutro Abs 07/02/2021 4.0  1.7 - 7.7 K/uL Final   Lymphocytes Relative 07/02/2021 28  % Final   Lymphs Abs 07/02/2021 2.1  0.7 - 4.0 K/uL Final   Monocytes Relative 07/02/2021 7  % Final   Monocytes Absolute 07/02/2021 0.5  0.1 - 1.0 K/uL Final   Eosinophils Relative 07/02/2021 11  % Final   Eosinophils Absolute 07/02/2021 0.8 (A) 0.0 - 0.5 K/uL Final   Basophils Relative 07/02/2021 1  % Final   Basophils Absolute 07/02/2021 0.1  0.0 - 0.1 K/uL Final   Immature Granulocytes 07/02/2021 0  % Final   Abs Immature Granulocytes 07/02/2021 0.01  0.00 - 0.07 K/uL Final   Performed at [facility], [address] [place], [place] [address]    Assessment and Plan.   1. Prostate cancer ([place])   2. Iron deficiency anemia, unspecified iron deficiency anemia type   3. Androgen deprivation therapy     # Recurrent prostate Cancer- diagnosed 2009 s/p cryotherapy and biochemical recurrence in 2010, s/p IMRT, 08/2020 biopsy proven persistent local recurrence, not a candidate for additional RT. Castration sensitive local recurrence.  Currently receiving Eligard every 6 months - due in Nov 2022 Labs are reviewed and discussed with patient. PSA is stable at 0.31 Discussed about adding Abiaterone.  Rationale and possible side effects were discussed. He agrees with the plan.  Will check coverage.   # Iron deficiency anemia- check iron panel at next viist.   Follow up 2 weeks after starting Abiaterone '750mg'$  daily + prednisone '5mg'$  daily.    I discussed the assessment and treatment plan with the patient.  The patient was provided an opportunity to ask questions and all were answered.  The patient agreed with the plan and demonstrated an understanding of the instructions.  The patient was advised to call back if the symptoms worsen or if the condition fails to improve as anticipated.  Earlie Server, MD, PhD Hematology Oncology [place] at [facility] 07/03/2021

## 2021-07-04 ENCOUNTER — Encounter: Payer: Self-pay | Admitting: Oncology

## 2021-07-04 ENCOUNTER — Telehealth: Payer: Self-pay | Admitting: Pharmacist

## 2021-07-04 ENCOUNTER — Telehealth: Payer: Self-pay | Admitting: Pharmacy Technician

## 2021-07-04 ENCOUNTER — Other Ambulatory Visit (HOSPITAL_COMMUNITY): Payer: Self-pay

## 2021-07-04 NOTE — Telephone Encounter (Signed)
Oral Oncology Patient Advocate Encounter  Prior Authorization for James Lucas has been approved.    PA# [id number] Effective dates: 06/04/21 through 07/04/22  Patients co-pay is $1799.70.  Oral Oncology Clinic will continue to follow.   [name] Phone [phone] Fax [phone] 07/04/2021 9:43 AM

## 2021-07-04 NOTE — Telephone Encounter (Signed)
Oral Oncology Patient Advocate Encounter   Received notification from [employer] that prior authorization for Zytiga is required.   PA submitted on CoverMyMeds Key [id number] Status is pending   Oral Oncology Clinic will continue to follow.  [place] Patient [place] Phone [phone] Fax [phone] 07/04/2021 9:12 AM

## 2021-07-04 NOTE — Telephone Encounter (Signed)
Oral Oncology Pharmacist Encounter  Received new prescription for Zytiga (abiraterone), to be added onto his treatment for castration sensitive recurrent prostate cancer in conjunction with prednisone, planned duration until disease progression or unacceptable drug toxicity.  CMP from 07/02/21 assessed, no relevant lab abnormalities. Prescription dose and frequency assessed.   Current medication list in Epic reviewed, no DDIs with abiraterone identified.  Evaluated chart and no patient barriers to medication adherence identified.   Prescription has been e-scribed to the [facility] for benefits analysis and approval.  Oral Oncology Clinic will continue to follow for insurance authorization, copayment issues, initial counseling and start date.  Patient agreed to treatment on 07/03/21 per MD documentation.  Darl Pikes, PharmD, BCPS, BCOP, CPP Hematology/Oncology Clinical Pharmacist Practitioner [facility] [place] [phone]  07/04/2021 9:17 AM

## 2021-07-09 ENCOUNTER — Other Ambulatory Visit (HOSPITAL_COMMUNITY): Payer: Self-pay

## 2021-07-10 ENCOUNTER — Inpatient Hospital Stay: Payer: Medicare Other | Admitting: Pharmacist

## 2021-07-10 DIAGNOSIS — C61 Malignant neoplasm of prostate: Secondary | ICD-10-CM | POA: Diagnosis not present

## 2021-07-10 NOTE — Progress Notes (Signed)
[facility]  Telephone:([phone] [phone]  Patient Care Team: Ezequiel Kayser, MD (Inactive) as PCP - General (Internal Medicine)   Name of the patient: James Lucas  [id number]  1941/09/17   Date of visit: 07/10/21  HPI: Patient is a 80 y.o. male with locally recurrent castration sensitive prostate cancer starting on Zytiga (abiraterone) in conjunction with prednisone and Eligard.  Reason for Consult: Zytiga (abiraterone) oral chemotherapy education.   PAST MEDICAL HISTORY: Past Medical History:  Diagnosis Date   Allergic rhinitis    Bladder cancer (HCC)    BPH (benign prostatic hyperplasia)    Chronic back pain    Essential hypertension    Glaucoma    Impotence of organic origin    Liver laceration    Prostate cancer ([place])    Stenosis, spinal, lumbar     HEMATOLOGY/ONCOLOGY HISTORY:  Oncology History   No history exists.    ALLERGIES:  is allergic to ace inhibitors.  MEDICATIONS:  Current Outpatient Medications  Medication Sig Dispense Refill   abiraterone acetate (ZYTIGA) 250 MG tablet Take 3 tablets (750 mg total) by mouth daily. Take on an empty stomach 1 hour before or 2 hours after a meal 90 tablet 0   albuterol (VENTOLIN HFA) 108 (90 Base) MCG/ACT inhaler Inhale 2 puffs into the lungs every 4 (four) hours as needed for wheezing or shortness of breath. (Patient not taking: No sig reported)     aspirin EC 81 MG tablet Take 81 mg by mouth daily.      ferrous sulfate 325 (65 FE) MG tablet Take by mouth.     fluticasone (FLONASE) 50 MCG/ACT nasal spray Place 1 spray into both nostrils daily as needed.     hydrochlorothiazide (HYDRODIURIL) 25 MG tablet Take 25 mg by mouth daily.      latanoprost (XALATAN) 0.005 % ophthalmic solution Place 1 drop into both eyes at bedtime.      montelukast (SINGULAIR) 10 MG tablet Take 10 mg by mouth daily.      Multiple Vitamin (MULTI-VITAMIN) tablet Take 1 tablet by  mouth daily.     predniSONE (DELTASONE) 5 MG tablet Take 1 tablet (5 mg total) by mouth daily with breakfast. 30 tablet 0   vitamin B-12 (CYANOCOBALAMIN) 1000 MCG tablet Take 1,000 mcg by mouth daily.      No current facility-administered medications for this visit.    VITAL SIGNS: There were no vitals taken for this visit. There were no vitals filed for this visit.  Estimated body mass index is 30.72 kg/m as calculated from the following:   Height as of 08/22/20: 6' (1.829 m).   Weight as of 07/03/21: 102.7 kg (226 lb 8.4 oz).  LABS: CBC:    Component Value Date/Time   WBC 7.5 07/02/2021 0818   HGB 12.7 (L) 07/02/2021 0818   HCT 39.4 07/02/2021 0818   PLT 215 07/02/2021 0818   MCV 98.5 07/02/2021 0818   NEUTROABS 4.0 07/02/2021 0818   LYMPHSABS 2.1 07/02/2021 0818   MONOABS 0.5 07/02/2021 0818   EOSABS 0.8 (H) 07/02/2021 0818   BASOSABS 0.1 07/02/2021 0818   Comprehensive Metabolic Panel:    Component Value Date/Time   NA 139 07/02/2021 0818   K 4.7 07/02/2021 0818   CL 99 07/02/2021 0818   CO2 32 07/02/2021 0818   BUN 12 07/02/2021 0818   CREATININE 0.97 07/02/2021 0818   GLUCOSE 106 (H) 07/02/2021 0818   CALCIUM 9.2 07/02/2021 0818  AST 23 07/02/2021 0818   ALT 16 07/02/2021 0818   ALKPHOS 48 07/02/2021 0818   BILITOT 0.7 07/02/2021 0818   PROT 7.3 07/02/2021 0818   ALBUMIN 4.3 07/02/2021 0818     Present during today's visit: Patient only.  Assessment and Plan: Start plan: 07/11/21   Patient Education I spoke with patient for overview of new oral chemotherapy medication: Zytiga (abiraterone) for the treatment of locally recurrent castration sensitive prostate cancer, planned duration until disease progression or unacceptable drug toxicity.   Administration: Counseled patient on administration, dosing, side effects, monitoring, drug-food interactions, safe handling, storage, and disposal. Patient will take 3 tablets (750 mg total) by mouth daily on an  empty stomach 1 hour before or 2 hours after a meal.  Patient will take prednisone 5 mg by mouth daily with breakfast.  Side Effects: Side effects include but not limited to: Fatigue, hypokalemia, hypernatremia, hypertriglyceridemia, hyperglycemia, increased LFTs, increased BP, edema.   Fatigue: Patient expressed some concerns about decreased energy after getting started on this medication. Encouraged him to continue engaging in some level of physical activities daily (e.g. take walks, carpentry, cooking). Changes in electrolytes and LFTs: Baseline levels assessed and were WNL. Will recheck CMP at follow-up on 07/24/21. Changes in BG/lipid: Patient is scheduled for diabetes screening and lipid panel [facility] in September but was unsure if this would happen as he is getting his PCP reestablished.  Hypertension: Patient has blood pressure cuff at home and was advised to check and record BP levels more frequently and watch for symptoms of HTN. Edema: Educated patient on signs and symptoms of fluid retention and ways to manage symptoms.  Drug-drug Interactions (DDI): None identified. Patient reported taking a "homemade" supplement for kidney. He agreed to follow up with [place] to provide the ingredients. Advised patient to avoid eating grapefruit.  Adherence: After discussion with patient, patient expressed concerns about affordability. Reassured patient that we are working to get financial assistance for him from the [employer] patient assistance program.  Reviewed with patient importance of keeping a medication schedule and plan for any missed doses.  Mr. Ruland voiced understanding and appreciation. All questions answered. Medication handout provided.  Provided patient with [facility] [facility] phone number. Patient knows to call the office with questions or concerns. Oral Chemotherapy Navigation Clinic will continue to follow.  Patient expressed understanding and  was in agreement with this plan. He also understands that He can call clinic at any time with any questions, concerns, or complaints.   Medication Access Issues: None identified. Patient will pick up prednisone from [facility] in [place]. Application for Zytiga assistance signed during visit today. He was provided with Zytiga to get started with while his application is pending.   Follow-up plan: Appointment with Dr. Tasia Catchings scheduled for 07/24/21.  Thank you for allowing me to participate in the care of this patient.   Time Total: 35 minutes  Visit consisted of counseling and education on dealing with issues of symptom management in the setting of serious and potentially life-threatening illness.Greater than 50%  of this time was spent counseling and coordinating care related to the above assessment and plan.  Signed by: Darl Pikes, PharmD, BCPS, Salley Slaughter, CPP Hematology/Oncology Clinical Pharmacist Practitioner [facility] [place] [facility] [facility] [phone]  07/10/2021 10:45 AM

## 2021-07-14 ENCOUNTER — Telehealth: Payer: Self-pay | Admitting: Pharmacy Technician

## 2021-07-14 NOTE — Telephone Encounter (Signed)
Oral Oncology Patient Advocate Encounter  Patient came in for an appointment on [id number] and signed application for James Lucas and James Lucas in an effort to reduce patient's out of pocket expense for James Lucas to $0.    Application completed and faxed to [phone] on 07/11/21.   James Lucas patient assistance phone number for follow up is [phone].   This encounter will be updated until final determination.  [place] Patient [place] Phone [phone] Fax [phone] 07/14/2021 12:01 PM

## 2021-07-14 NOTE — Telephone Encounter (Signed)
Oral Oncology Patient Advocate Encounter  Received notification from [employer] [employer] [place] ([employer]) that patient has been successfully enrolled into their program to receive Zytiga from the manufacturer at $0 out of pocket until 09/12/21, pending Medicare D attestation submission.  Once the attestation is received patient will be approved to 11/15/21.    I called and spoke with patient.  He knows we will have to re-apply.   Specialty Pharmacy that will dispense medication is [facility].  Patient knows to call the office with questions or concerns.   Oral Oncology Clinic will continue to follow.  [place] Patient [place] Phone [phone] Fax [phone] 07/14/2021 12:03 PM

## 2021-07-17 ENCOUNTER — Other Ambulatory Visit: Payer: Self-pay

## 2021-07-17 ENCOUNTER — Inpatient Hospital Stay: Payer: Medicare Other | Attending: Oncology

## 2021-07-17 DIAGNOSIS — D509 Iron deficiency anemia, unspecified: Secondary | ICD-10-CM

## 2021-07-17 DIAGNOSIS — Z7982 Long term (current) use of aspirin: Secondary | ICD-10-CM | POA: Diagnosis not present

## 2021-07-17 DIAGNOSIS — C61 Malignant neoplasm of prostate: Secondary | ICD-10-CM | POA: Insufficient documentation

## 2021-07-17 DIAGNOSIS — Z8546 Personal history of malignant neoplasm of prostate: Secondary | ICD-10-CM | POA: Insufficient documentation

## 2021-07-17 DIAGNOSIS — I10 Essential (primary) hypertension: Secondary | ICD-10-CM | POA: Insufficient documentation

## 2021-07-17 DIAGNOSIS — Z905 Acquired absence of kidney: Secondary | ICD-10-CM | POA: Diagnosis not present

## 2021-07-17 DIAGNOSIS — Z79899 Other long term (current) drug therapy: Secondary | ICD-10-CM | POA: Insufficient documentation

## 2021-07-17 DIAGNOSIS — Z8551 Personal history of malignant neoplasm of bladder: Secondary | ICD-10-CM | POA: Diagnosis not present

## 2021-07-17 DIAGNOSIS — Z87891 Personal history of nicotine dependence: Secondary | ICD-10-CM | POA: Diagnosis not present

## 2021-07-17 DIAGNOSIS — Z7952 Long term (current) use of systemic steroids: Secondary | ICD-10-CM | POA: Insufficient documentation

## 2021-07-17 DIAGNOSIS — E538 Deficiency of other specified B group vitamins: Secondary | ICD-10-CM | POA: Diagnosis not present

## 2021-07-17 LAB — CBC WITH DIFFERENTIAL/PLATELET
Abs Immature Granulocytes: 0.03 10*3/uL (ref 0.00–0.07)
Basophils Absolute: 0.1 10*3/uL (ref 0.0–0.1)
Basophils Relative: 1 %
Eosinophils Absolute: 0.5 10*3/uL (ref 0.0–0.5)
Eosinophils Relative: 6 %
HCT: 36.3 % — ABNORMAL LOW (ref 39.0–52.0)
Hemoglobin: 12.1 g/dL — ABNORMAL LOW (ref 13.0–17.0)
Immature Granulocytes: 0 %
Lymphocytes Relative: 21 %
Lymphs Abs: 1.7 10*3/uL (ref 0.7–4.0)
MCH: 31.8 pg (ref 26.0–34.0)
MCHC: 33.3 g/dL (ref 30.0–36.0)
MCV: 95.3 fL (ref 80.0–100.0)
Monocytes Absolute: 0.6 10*3/uL (ref 0.1–1.0)
Monocytes Relative: 7 %
Neutro Abs: 5.2 10*3/uL (ref 1.7–7.7)
Neutrophils Relative %: 65 %
Platelets: 199 10*3/uL (ref 150–400)
RBC: 3.81 MIL/uL — ABNORMAL LOW (ref 4.22–5.81)
RDW: 12.2 % (ref 11.5–15.5)
WBC: 8 10*3/uL (ref 4.0–10.5)
nRBC: 0 % (ref 0.0–0.2)

## 2021-07-17 LAB — IRON AND TIBC
Iron: 110 ug/dL (ref 45–182)
Saturation Ratios: 34 % (ref 17.9–39.5)
TIBC: 326 ug/dL (ref 250–450)
UIBC: 216 ug/dL

## 2021-07-17 LAB — PSA: Prostatic Specific Antigen: 0.24 ng/mL (ref 0.00–4.00)

## 2021-07-17 LAB — FERRITIN: Ferritin: 64 ng/mL (ref 24–336)

## 2021-07-18 ENCOUNTER — Other Ambulatory Visit: Payer: Medicare Other

## 2021-07-18 ENCOUNTER — Other Ambulatory Visit: Payer: Self-pay

## 2021-07-18 DIAGNOSIS — C61 Malignant neoplasm of prostate: Secondary | ICD-10-CM

## 2021-07-24 ENCOUNTER — Ambulatory Visit: Payer: Medicare Other | Admitting: Radiation Oncology

## 2021-07-24 ENCOUNTER — Encounter: Payer: Self-pay | Admitting: Oncology

## 2021-07-24 ENCOUNTER — Other Ambulatory Visit: Payer: Self-pay

## 2021-07-24 ENCOUNTER — Inpatient Hospital Stay: Payer: Medicare Other

## 2021-07-24 ENCOUNTER — Inpatient Hospital Stay (HOSPITAL_BASED_OUTPATIENT_CLINIC_OR_DEPARTMENT_OTHER): Payer: Medicare Other | Admitting: Oncology

## 2021-07-24 VITALS — BP 152/73 | HR 72 | Temp 97.8°F | Resp 18 | Wt 230.9 lb

## 2021-07-24 DIAGNOSIS — Z8546 Personal history of malignant neoplasm of prostate: Secondary | ICD-10-CM | POA: Diagnosis not present

## 2021-07-24 DIAGNOSIS — Z79818 Long term (current) use of other agents affecting estrogen receptors and estrogen levels: Secondary | ICD-10-CM

## 2021-07-24 DIAGNOSIS — C61 Malignant neoplasm of prostate: Secondary | ICD-10-CM

## 2021-07-24 DIAGNOSIS — D509 Iron deficiency anemia, unspecified: Secondary | ICD-10-CM

## 2021-07-24 DIAGNOSIS — E538 Deficiency of other specified B group vitamins: Secondary | ICD-10-CM

## 2021-07-24 DIAGNOSIS — I10 Essential (primary) hypertension: Secondary | ICD-10-CM

## 2021-07-24 DIAGNOSIS — H409 Unspecified glaucoma: Secondary | ICD-10-CM | POA: Insufficient documentation

## 2021-07-24 DIAGNOSIS — Z7189 Other specified counseling: Secondary | ICD-10-CM

## 2021-07-24 LAB — CBC WITH DIFFERENTIAL/PLATELET
Abs Immature Granulocytes: 0.03 10*3/uL (ref 0.00–0.07)
Basophils Absolute: 0.1 10*3/uL (ref 0.0–0.1)
Basophils Relative: 1 %
Eosinophils Absolute: 0.3 10*3/uL (ref 0.0–0.5)
Eosinophils Relative: 3 %
HCT: 36 % — ABNORMAL LOW (ref 39.0–52.0)
Hemoglobin: 11.9 g/dL — ABNORMAL LOW (ref 13.0–17.0)
Immature Granulocytes: 0 %
Lymphocytes Relative: 20 %
Lymphs Abs: 2.2 10*3/uL (ref 0.7–4.0)
MCH: 31.7 pg (ref 26.0–34.0)
MCHC: 33.1 g/dL (ref 30.0–36.0)
MCV: 96 fL (ref 80.0–100.0)
Monocytes Absolute: 0.7 10*3/uL (ref 0.1–1.0)
Monocytes Relative: 6 %
Neutro Abs: 7.4 10*3/uL (ref 1.7–7.7)
Neutrophils Relative %: 70 %
Platelets: 201 10*3/uL (ref 150–400)
RBC: 3.75 MIL/uL — ABNORMAL LOW (ref 4.22–5.81)
RDW: 12.6 % (ref 11.5–15.5)
WBC: 10.6 10*3/uL — ABNORMAL HIGH (ref 4.0–10.5)
nRBC: 0 % (ref 0.0–0.2)

## 2021-07-24 LAB — COMPREHENSIVE METABOLIC PANEL
ALT: 18 U/L (ref 0–44)
AST: 21 U/L (ref 15–41)
Albumin: 4 g/dL (ref 3.5–5.0)
Alkaline Phosphatase: 50 U/L (ref 38–126)
Anion gap: 6 (ref 5–15)
BUN: 10 mg/dL (ref 8–23)
CO2: 31 mmol/L (ref 22–32)
Calcium: 8.9 mg/dL (ref 8.9–10.3)
Chloride: 99 mmol/L (ref 98–111)
Creatinine, Ser: 1.01 mg/dL (ref 0.61–1.24)
GFR, Estimated: >60 mL/min (ref >60)
Glucose, Bld: 97 mg/dL (ref 70–99)
Potassium: 4 mmol/L (ref 3.5–5.1)
Sodium: 136 mmol/L (ref 135–145)
Total Bilirubin: 0.6 mg/dL (ref 0.3–1.2)
Total Protein: 7.3 g/dL (ref 6.5–8.1)

## 2021-07-24 MED ORDER — AMLODIPINE BESYLATE 2.5 MG PO TABS: 2.5000 mg | ORAL_TABLET | Freq: Every day | ORAL | 0 refills | Status: DC

## 2021-07-24 NOTE — Progress Notes (Signed)
Hematology Oncology Progress Note   Clinic Day:  07/24/2021   Referring physician: Ezequiel Kayser, MD  Chief Complaint: James Lucas is a 80 y.o. male with a long-standing history of prostate cancer who presents for follow up.   PERTINENT ONCOLOGY HISTORY Patient previously followed up by Dr.Corcoran, patient switched care to me on 07/03/21 Extensive medical record review was performed by me Per note,  # 2009 prostate cancer .  Gleason score was 4+3 in 2009.  He received cryoablation. # 2010 PSA rose to 1.4, biochemical recurrence.  Prostate biopsy in 04/2009 revealed Gleason 5+4 adenocarcinoma.  He received IMRT with adjuvant androgen deprivation therapy.  PSA nadir was < 0.1.  05/23/2014  Prostate MRI revealed areas suspicious for extracapsular extension in the right posterior apex and along the posterior aspect of the anterior urethra.  ADT was discussed but due to history of side effects he elected to continue surveillance.  #2016 PSA increased and he was started on Lupron, intermittently secondary to side effects.  # 01/2016 PSA increased to 3.64 05/01/2016 CT revealed no evidence of recurrent or metastatic disease. Bone scan on 05/01/2016 showed no evidence of osseous metastatic disease # 07/10/2016 Seen by Dr.Finnegan, started on Leupron.  # 10/26/2016 [facility] discontinuation of Lupron as his PSA was < 4.0. He was to pursue intermittent therapy when PSA increased to about 10.  Testosterone and PSA checks were recommended every 2 months and PET/CT scan with F-18 fluciclovine to assess for oligomenorrhea static disease if PSA began to rise.  12/02/2017 Axumin PET scan revealed potential local recurrence in the residual left prostate gland, with the region of activity relatively small at approximately 1.5 cm. No evidence of metastatic disease in the abdomen pelvis or skeleton 09/02/2018 by Dr. Grayland Ormond.  At that time, PSA was 5.98.  He agreed to reinitiate  Lupron (received 09/12/2018).  07/02/2020 PET Axumin scan on  revealed intense radiotracer activity in the LEFT prostate gland concern for residual carcinoma.  There was no significant change from PET-CT 09/09/2018.  There was no nodal metastasis or skeletal metastasis.  He is s/p RIGHT nephrectomy.  08/22/2020 revealed prostatic adenocarcinoma in 9/12 (six from the left side; 3 from the right side) biopsies.  The tumor showed mixed morphology with some areas showing ADT treatment effect and other areas without significant treatment effect.  Pattern was Gleason 4 and 5 in areas of less treatment effect.  The background prostate was fibrotic c/w radiation effect.   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  He undergoes surveillance cystoscopy by Dr. [facility]).   Last cystoscopy was normal on 01/12/2019.  on Eligard '45mg'$  Q6 months. Last received on 04/02/2021  Interval History:  INTERVAL HISTORY James Lucas is a 80 y.o. male who has above history reviewed by me today presents for follow up visit for management of prostate cancer 07/10/2021, patient started on Abiateron 750 mg daily and prednisone 5 mg daily. So far he tolerates well.  Denies any side effects.  He has followed instructions of monitoring his blood pressure at home closely.  He brought a log to me. Overall he is blood pressure is slightly higher than his baseline.  Especially in a.m., SBP ranges from 140-170s.  Patient is on hydrochlorothiazide 25 mg daily.  He previously did not tolerate ACE inhibitors due to cough.  Review of Systems  Constitutional:  Positive for fatigue. Negative for appetite  change, chills, fever and unexpected weight change.  HENT:   Negative for hearing loss and voice change.   Eyes:  Negative for eye problems and icterus.  Respiratory:  Negative for chest tightness, cough and shortness of breath.   Cardiovascular:  Negative  for chest pain and leg swelling.  Gastrointestinal:  Negative for abdominal distention and abdominal pain.  Endocrine: Positive for hot flashes.  Genitourinary:  Negative for difficulty urinating, dysuria and frequency.   Skin:  Negative for itching and rash.  Neurological:  Negative for light-headedness and numbness.  Hematological:  Negative for adenopathy. Does not bruise/bleed easily.  Psychiatric/Behavioral:  Negative for confusion.     Past Medical History:  Diagnosis Date   Allergic rhinitis    Bladder cancer (HCC)    BPH (benign prostatic hyperplasia)    Chronic back pain    Essential hypertension    Glaucoma    Impotence of organic origin    Liver laceration    Prostate cancer ([place])    Stenosis, spinal, lumbar     Past Surgical History:  Procedure Laterality Date   BACK SURGERY     COLONOSCOPY     COLONOSCOPY WITH PROPOFOL N/A 08/24/2019   Procedure: COLONOSCOPY WITH PROPOFOL;  Surgeon: Lollie Sails, MD;  Location: [facility] ENDOSCOPY;  Service: Endoscopy;  Laterality: N/A;   HERNIA REPAIR     NEPHRECTOMY Right 1978   TRANSURETHRAL RESECTION OF BLADDER TUMOR WITH GYRUS (TURBT-GYRUS)      Family History  Problem Relation Age of Onset   Cancer Father     Social History:  reports that he quit smoking about 57 years ago. He has never used smokeless tobacco. He reports that he does not drink alcohol and does not use drugs. He is a former smoker and smoked several packs per day in his early 80s but has not smoked in over 16 years. He does not drink alcohol. He is retired but used to work for [employer]. He denies known exposure to radiation or toxins. The patient is alone today.   Allergies:  Allergies  Allergen Reactions   Ace Inhibitors Cough    Current Medications: Current Outpatient Medications  Medication Sig Dispense Refill   abiraterone acetate (ZYTIGA) 250 MG tablet Take 3 tablets (750 mg total) by mouth daily. Take on an empty stomach 1 hour before or 2 hours  after a meal 90 tablet 0   amLODipine (NORVASC) 2.5 MG tablet Take 1 tablet (2.5 mg total) by mouth daily. 30 tablet 0   aspirin EC 81 MG tablet Take 81 mg by mouth daily.      ferrous sulfate 325 (65 FE) MG tablet Take by mouth.     hydrochlorothiazide (HYDRODIURIL) 25 MG tablet Take 25 mg by mouth daily.      latanoprost (XALATAN) 0.005 % ophthalmic solution Place 1 drop into both eyes at bedtime.      montelukast (SINGULAIR) 10 MG tablet Take 10 mg by mouth daily.      Multiple Vitamin (MULTI-VITAMIN) tablet Take 1 tablet by mouth daily.     predniSONE (DELTASONE) 5 MG tablet Take 1 tablet (5 mg total) by mouth daily with breakfast. 30 tablet 0   vitamin B-12 (CYANOCOBALAMIN) 1000 MCG tablet Take 1,000 mcg by mouth daily.      albuterol (VENTOLIN HFA) 108 (90 Base) MCG/ACT inhaler Inhale 2 puffs into the lungs every 4 (four) hours as needed for wheezing or shortness of breath. (Patient not taking: No sig reported)  fluticasone (FLONASE) 50 MCG/ACT nasal spray Place 1 spray into both nostrils daily as needed. (Patient not taking: Reported on 07/24/2021)     No current facility-administered medications for this visit.     Performance status (ECOG): 1  Vital Signs: Blood pressure (!) 152/73, pulse 72, temperature 97.8 F (36.6 C), resp. rate 18, weight 230 lb 14.9 oz (104.8 kg), SpO2 100 %. Physical Exam Constitutional:      General: He is not in acute distress.    Appearance: He is not diaphoretic.  HENT:     Head: Normocephalic and atraumatic.     Nose: Nose normal.     Mouth/Throat:     Pharynx: No oropharyngeal exudate.  Eyes:     General: No scleral icterus.    Pupils: Pupils are equal, round, and reactive to light.  Cardiovascular:     Rate and Rhythm: Normal rate and regular rhythm.     Heart sounds: No murmur heard. Pulmonary:     Effort: Pulmonary effort is normal. No respiratory distress.     Breath sounds: No rales.  Chest:     Chest wall: No tenderness.   Abdominal:     General: There is no distension.     Palpations: Abdomen is soft.     Tenderness: There is no abdominal tenderness.  Musculoskeletal:        General: Normal range of motion.     Cervical back: Normal range of motion and neck supple.  Skin:    General: Skin is warm and dry.     Findings: No erythema.  Neurological:     Mental Status: He is alert and oriented to person, place, and time.     Cranial Nerves: No cranial nerve deficit.     Motor: No abnormal muscle tone.     Coordination: Coordination normal.  Psychiatric:        Mood and Affect: Affect normal.     Appointment on 07/24/2021  Component Date Value Ref Range Status   Sodium 07/24/2021 136  135 - 145 mmol/L Final   Potassium 07/24/2021 4.0  3.5 - 5.1 mmol/L Final   Chloride 07/24/2021 99  98 - 111 mmol/L Final   CO2 07/24/2021 31  22 - 32 mmol/L Final   Glucose, Bld 07/24/2021 97  70 - 99 mg/dL Final   Glucose reference range applies only to samples taken after fasting for at least 8 hours.   BUN 07/24/2021 10  8 - 23 mg/dL Final   Creatinine, Ser 07/24/2021 1.01  0.61 - 1.24 mg/dL Final   Calcium 07/24/2021 8.9  8.9 - 10.3 mg/dL Final   Total Protein 07/24/2021 7.3  6.5 - 8.1 g/dL Final   Albumin 07/24/2021 4.0  3.5 - 5.0 g/dL Final   AST 07/24/2021 21  15 - 41 U/L Final   ALT 07/24/2021 18  0 - 44 U/L Final   Alkaline Phosphatase 07/24/2021 50  38 - 126 U/L Final   Total Bilirubin 07/24/2021 0.6  0.3 - 1.2 mg/dL Final   GFR, Estimated 07/24/2021 >60  >60 mL/min Final   Comment: (NOTE) Calculated using the CKD-EPI Creatinine Equation (2021)    Anion gap 07/24/2021 6  5 - 15 Final   Performed at [facility], [address], [place], [place] [address]   WBC 07/24/2021 10.6 (A) 4.0 - 10.5 K/uL Final   RBC 07/24/2021 3.75 (A) 4.22 - 5.81 MIL/uL Final   Hemoglobin 07/24/2021 11.9 (A) 13.0 - 17.0 g/dL Final  HCT 07/24/2021 36.0 (A) 39.0 - 52.0 % Final   MCV 07/24/2021 96.0  80.0 - 100.0  fL Final   MCH 07/24/2021 31.7  26.0 - 34.0 pg Final   MCHC 07/24/2021 33.1  30.0 - 36.0 g/dL Final   RDW 07/24/2021 12.6  11.5 - 15.5 % Final   Platelets 07/24/2021 201  150 - 400 K/uL Final   nRBC 07/24/2021 0.0  0.0 - 0.2 % Final   Neutrophils Relative % 07/24/2021 70  % Final   Neutro Abs 07/24/2021 7.4  1.7 - 7.7 K/uL Final   Lymphocytes Relative 07/24/2021 20  % Final   Lymphs Abs 07/24/2021 2.2  0.7 - 4.0 K/uL Final   Monocytes Relative 07/24/2021 6  % Final   Monocytes Absolute 07/24/2021 0.7  0.1 - 1.0 K/uL Final   Eosinophils Relative 07/24/2021 3  % Final   Eosinophils Absolute 07/24/2021 0.3  0.0 - 0.5 K/uL Final   Basophils Relative 07/24/2021 1  % Final   Basophils Absolute 07/24/2021 0.1  0.0 - 0.1 K/uL Final   Immature Granulocytes 07/24/2021 0  % Final   Abs Immature Granulocytes 07/24/2021 0.03  0.00 - 0.07 K/uL Final   Performed at [facility], [address], [place], [place] [address]    Assessment and Plan.   1. Prostate cancer ([place])   2. Iron deficiency anemia, unspecified iron deficiency anemia type   3. Androgen deprivation therapy   4. B12 deficiency     # Recurrent prostate Cancer- diagnosed 2009 s/p cryotherapy and biochemical recurrence in 2010, s/p IMRT, 08/2020 biopsy proven persistent local recurrence, not a candidate for additional RT. Castration sensitive local recurrence.  Currently receiving Eligard every 6 months - due in Nov 2022 August 2022, started on Abiateron 750 mg plus prednisone 5 mg daily.  Overall tolerates well.  Continue current regimen. Recommend patient to report symptoms of stomach discomfort and may consider PPI or H2 blocker for GI prophylaxis.  PSA is stable at 0.24  #Hypertension, currently on hydrochlorothiazide 25 mg daily.  Blood pressure has increased due to side effects of Abiateron.  Recommend patient to continue monitor blood pressure at home.  I gave him a prescription of Norvasc 2.5 mg if his SBP is  persistently above 150 he may utilize low-dose Norvasc for BP control.  We will further titrate according to his BP log.  # Iron deficiency anemia-iron panel is stable.  Follow up 4 weeks lab, CBC CMP PSA prior to virtual visit.  I discussed the assessment and treatment plan with the patient.  The patient was provided an opportunity to ask questions and all were answered.  The patient agreed with the plan and demonstrated an understanding of the instructions.  The patient was advised to call back if the symptoms worsen or if the condition fails to improve as anticipated.  Earlie Server, MD, PhD Hematology Oncology [place] at [facility] 07/24/2021

## 2021-07-31 ENCOUNTER — Other Ambulatory Visit: Payer: Self-pay | Admitting: *Deleted

## 2021-07-31 ENCOUNTER — Encounter: Payer: Self-pay | Admitting: Radiation Oncology

## 2021-07-31 ENCOUNTER — Ambulatory Visit
Admission: RE | Admit: 2021-07-31 | Discharge: 2021-07-31 | Disposition: A | Discharge status: Home / Self Care | Payer: Medicare Other | Source: Ambulatory Visit | Attending: Radiation Oncology | Admitting: Radiation Oncology

## 2021-07-31 VITALS — BP 124/66 | HR 88 | Temp 97.1°F | Resp 18 | Wt 222.5 lb

## 2021-07-31 DIAGNOSIS — Z923 Personal history of irradiation: Secondary | ICD-10-CM | POA: Diagnosis not present

## 2021-07-31 DIAGNOSIS — R351 Nocturia: Secondary | ICD-10-CM | POA: Diagnosis not present

## 2021-07-31 DIAGNOSIS — C61 Malignant neoplasm of prostate: Secondary | ICD-10-CM

## 2021-07-31 NOTE — Progress Notes (Signed)
Radiation Oncology Follow up Note  Name: James Lucas   Date:   07/31/2021 MRN:  [id number] DOB: [birth year]    This 80 y.o. male presents to the clinic today for 1 year follow-up status post IMRT radiation therapy for salvage radiation therapy 12 years status post cryotherapy for Gleason 9 adenocarcinoma the prostate.  REFERRING PROVIDER: Ezequiel Kayser, MD  HPI: Patient is a 80 year old male now at 1 year having completed salvage radiation therapy for original Gleason 9 adenocarcinoma the prostate status post cryotherapy biochemical failure.  He is seen today in routine follow-up is doing fairly well he states he has nocturia x3 no specific other increased lower urinary tract symptoms.Marland Kitchen  He is currently under medical oncology's direction receiving Eligard every 6 months.  He was also started in August onAbiateron 750 mg plus prednisone 5 mg daily.  He is tolerating his treatments well.  His most recent PSA is 0.24.  COMPLICATIONS OF TREATMENT: none  FOLLOW UP COMPLIANCE: keeps appointments   PHYSICAL EXAM:  BP 124/66   Pulse 88   Temp (!) 97.1 F (36.2 C)   Resp 18   Wt 222 lb 8 oz (100.9 kg)   SpO2 100%   BMI 30.18 kg/m  Well-developed well-nourished patient in NAD. HEENT reveals PERLA, EOMI, discs not visualized.  Oral cavity is clear. No oral mucosal lesions are identified. Neck is clear without evidence of cervical or supraclavicular adenopathy. Lungs are clear to A&P. Cardiac examination is essentially unremarkable with regular rate and rhythm without murmur rub or thrill. Abdomen is benign with no organomegaly or masses noted. Motor sensory and DTR levels are equal and symmetric in the upper and lower extremities. Cranial nerves II through XII are grossly intact. Proprioception is intact. No peripheral adenopathy or edema is identified. No motor or sensory levels are noted. Crude visual fields are within normal range.  RADIOLOGY RESULTS: No current films to review  PLAN: Present  time patient is doing well currently under therapy through medical oncology for his castrate sensitive prostate cancer.  I have asked to see him back in 6 months for follow-up.  He is having no bone pain or symptoms at this time.  Patient knows to call with any concerns.  I would like to take this opportunity to thank you for allowing me to participate in the care of your patient.Noreene Filbert, MD

## 2021-08-06 ENCOUNTER — Encounter: Payer: Self-pay | Admitting: Oncology

## 2021-08-06 ENCOUNTER — Other Ambulatory Visit: Payer: Self-pay | Admitting: Oncology

## 2021-08-06 ENCOUNTER — Other Ambulatory Visit (HOSPITAL_COMMUNITY): Payer: Self-pay

## 2021-08-06 ENCOUNTER — Telehealth: Payer: Self-pay | Admitting: Pharmacy Technician

## 2021-08-06 NOTE — Telephone Encounter (Signed)
Oral Oncology Patient Advocate Encounter   Was successful in securing patient a $3500 grant from [employer] (PA[employer]) to provide copayment coverage for Zytiga.  This will keep the out of pocket expense at $0.     I have spoken with the patient.    The billing information is as follows and has been shared with [place].   RxBin: [id number] PCN:  [id number] Member ID: [id number] Group ID: [id number] Dates of Eligibility: 08/06/21 through 08/06/21  Fund: [place] Patient [place] Phone [phone] Fax [phone] 08/06/2021 2:50 PM

## 2021-08-21 ENCOUNTER — Other Ambulatory Visit: Payer: Self-pay

## 2021-08-21 ENCOUNTER — Inpatient Hospital Stay: Payer: Medicare Other | Attending: Oncology

## 2021-08-21 DIAGNOSIS — C61 Malignant neoplasm of prostate: Secondary | ICD-10-CM | POA: Diagnosis not present

## 2021-08-21 DIAGNOSIS — Z9221 Personal history of antineoplastic chemotherapy: Secondary | ICD-10-CM | POA: Diagnosis not present

## 2021-08-21 DIAGNOSIS — Z87891 Personal history of nicotine dependence: Secondary | ICD-10-CM | POA: Insufficient documentation

## 2021-08-21 DIAGNOSIS — I10 Essential (primary) hypertension: Secondary | ICD-10-CM | POA: Insufficient documentation

## 2021-08-21 DIAGNOSIS — Z79899 Other long term (current) drug therapy: Secondary | ICD-10-CM | POA: Insufficient documentation

## 2021-08-21 LAB — CBC WITH DIFFERENTIAL/PLATELET
Abs Immature Granulocytes: 0.02 10*3/uL (ref 0.00–0.07)
Basophils Absolute: 0.1 10*3/uL (ref 0.0–0.1)
Basophils Relative: 1 %
Eosinophils Absolute: 0.2 10*3/uL (ref 0.0–0.5)
Eosinophils Relative: 3 %
HCT: 35.1 % — ABNORMAL LOW (ref 39.0–52.0)
Hemoglobin: 11.8 g/dL — ABNORMAL LOW (ref 13.0–17.0)
Immature Granulocytes: 0 %
Lymphocytes Relative: 17 %
Lymphs Abs: 1.3 10*3/uL (ref 0.7–4.0)
MCH: 31.8 pg (ref 26.0–34.0)
MCHC: 33.6 g/dL (ref 30.0–36.0)
MCV: 94.6 fL (ref 80.0–100.0)
Monocytes Absolute: 0.5 10*3/uL (ref 0.1–1.0)
Monocytes Relative: 7 %
Neutro Abs: 5.6 10*3/uL (ref 1.7–7.7)
Neutrophils Relative %: 72 %
Platelets: 205 10*3/uL (ref 150–400)
RBC: 3.71 MIL/uL — ABNORMAL LOW (ref 4.22–5.81)
RDW: 12.4 % (ref 11.5–15.5)
WBC: 7.7 10*3/uL (ref 4.0–10.5)
nRBC: 0 % (ref 0.0–0.2)

## 2021-08-21 LAB — COMPREHENSIVE METABOLIC PANEL
ALT: 19 U/L (ref 0–44)
AST: 26 U/L (ref 15–41)
Albumin: 4.1 g/dL (ref 3.5–5.0)
Alkaline Phosphatase: 46 U/L (ref 38–126)
Anion gap: 5 (ref 5–15)
BUN: 12 mg/dL (ref 8–23)
CO2: 31 mmol/L (ref 22–32)
Calcium: 8.7 mg/dL — ABNORMAL LOW (ref 8.9–10.3)
Chloride: 100 mmol/L (ref 98–111)
Creatinine, Ser: 0.93 mg/dL (ref 0.61–1.24)
GFR, Estimated: >60 mL/min (ref >60)
Glucose, Bld: 95 mg/dL (ref 70–99)
Potassium: 3.5 mmol/L (ref 3.5–5.1)
Sodium: 136 mmol/L (ref 135–145)
Total Bilirubin: 0.7 mg/dL (ref 0.3–1.2)
Total Protein: 6.8 g/dL (ref 6.5–8.1)

## 2021-08-21 LAB — PSA: Prostatic Specific Antigen: 0.06 ng/mL (ref 0.00–4.00)

## 2021-08-28 ENCOUNTER — Other Ambulatory Visit: Payer: Self-pay

## 2021-08-28 ENCOUNTER — Encounter: Payer: Self-pay | Admitting: Oncology

## 2021-08-28 ENCOUNTER — Inpatient Hospital Stay: Payer: Medicare Other | Attending: Oncology | Admitting: Oncology

## 2021-08-28 DIAGNOSIS — C61 Malignant neoplasm of prostate: Secondary | ICD-10-CM

## 2021-08-28 DIAGNOSIS — Z5111 Encounter for antineoplastic chemotherapy: Secondary | ICD-10-CM

## 2021-08-28 DIAGNOSIS — Z79818 Long term (current) use of other agents affecting estrogen receptors and estrogen levels: Secondary | ICD-10-CM

## 2021-08-28 MED ORDER — PREDNISONE 5 MG PO TABS: ORAL_TABLET | ORAL | 1 refills | Status: DC

## 2021-08-28 MED ORDER — AMLODIPINE BESYLATE 2.5 MG PO TABS: 2.5000 mg | ORAL_TABLET | Freq: Every day | ORAL | 0 refills | Status: DC

## 2021-08-28 MED ORDER — ABIRATERONE ACETATE 250 MG PO TABS: 750.0000 mg | ORAL_TABLET | Freq: Every day | ORAL | 0 refills | Status: DC

## 2021-08-28 NOTE — Progress Notes (Signed)
HEMATOLOGY-ONCOLOGY TeleHEALTH VISIT PROGRESS NOTE  I connected with James Lucas on 08/28/21  at  1:30 PM EDT by video enabled telemedicine visit and verified that I am speaking with the correct person using two identifiers. I discussed the limitations, risks, security and privacy concerns of performing an evaluation and management service by telemedicine and the availability of in-person appointments. The patient expressed understanding and agreed to proceed.   Other persons participating in the visit and their role in the encounter:  None  Patient's location: Home  Provider's location: office Chief Complaint: Follow-up for prostate cancer   INTERVAL HISTORY James Lucas is a 80 y.o. male who has above history reviewed by me today presents for follow up visit for management of prostate cancer Problems and complaints are listed below:  Patient is on androgen deprivation therapy as well as Abiateron plus prednisone.  He reports tolerating well.  Blood pressure was trending high last visit and he was started on Norvasc 2.5 mg daily.  He feels the Norvasc has decreased his blood pressure too much to 417 systolically.  So he has been using half of the 2.5 mg tablets and blood pressure is well controlled.  Today he reports that blood pressure systolically was 408X.  Review of Systems  Constitutional:  Negative for appetite change, chills, fever and unexpected weight change.  HENT:   Negative for hearing loss and voice change.   Eyes:  Negative for eye problems and icterus.  Respiratory:  Negative for chest tightness, cough and shortness of breath.   Cardiovascular:  Negative for chest pain and leg swelling.  Gastrointestinal:  Negative for abdominal distention and abdominal pain.  Endocrine: Negative for hot flashes.  Genitourinary:  Negative for difficulty urinating, dysuria and frequency.   Musculoskeletal:  Negative for arthralgias.  Skin:  Negative for itching and rash.   Neurological:  Negative for light-headedness and numbness.  Hematological:  Negative for adenopathy. Does not bruise/bleed easily.  Psychiatric/Behavioral:  Negative for confusion.    Past Medical History:  Diagnosis Date   Allergic rhinitis    Bladder cancer (HCC)    BPH (benign prostatic hyperplasia)    Chronic back pain    Essential hypertension    Glaucoma    Impotence of organic origin    Liver laceration    Prostate cancer ([place])    Stenosis, spinal, lumbar    Past Surgical History:  Procedure Laterality Date   BACK SURGERY     COLONOSCOPY     COLONOSCOPY WITH PROPOFOL N/A 08/24/2019   Procedure: COLONOSCOPY WITH PROPOFOL;  Surgeon: Lollie Sails, MD;  Location: [facility] ENDOSCOPY;  Service: Endoscopy;  Laterality: N/A;   HERNIA REPAIR     NEPHRECTOMY Right 1978   TRANSURETHRAL RESECTION OF BLADDER TUMOR WITH GYRUS (TURBT-GYRUS)      Family History  Problem Relation Age of Onset   Cancer Father     Social History   Socioeconomic History   Marital status: Married    Spouse name: Not on file   Number of children: Not on file   Years of education: Not on file   Highest education level: Not on file  Occupational History   Not on file  Tobacco Use   Smoking status: Former    Types: Cigarettes    Quit date: 1965    Years since quitting: 57.8   Smokeless tobacco: Never  Vaping Use   Vaping Use: Never used  Substance and Sexual Activity   Alcohol use: No   Drug  use: No   Sexual activity: Not on file  Other Topics Concern   Not on file  Social History Narrative   Not on file   Social Determinants of Health   Financial Resource Strain: Not on file  Food Insecurity: Not on file  Transportation Needs: Not on file  Physical Activity: Not on file  Stress: Not on file  Social Connections: Not on file  Intimate Partner Violence: Not on file    Current Outpatient Medications on File Prior to Visit  Medication Sig Dispense Refill   aspirin EC 81 MG tablet  Take 81 mg by mouth daily.      ferrous sulfate 325 (65 FE) MG tablet Take by mouth.     fluticasone (FLONASE) 50 MCG/ACT nasal spray Place 1 spray into both nostrils daily as needed.     hydrochlorothiazide (HYDRODIURIL) 25 MG tablet Take 25 mg by mouth daily.      latanoprost (XALATAN) 0.005 % ophthalmic solution Place 1 drop into both eyes at bedtime.      montelukast (SINGULAIR) 10 MG tablet Take 10 mg by mouth daily.      Multiple Vitamin (MULTI-VITAMIN) tablet Take 1 tablet by mouth daily.     vitamin B-12 (CYANOCOBALAMIN) 1000 MCG tablet Take 1,000 mcg by mouth daily.      albuterol (VENTOLIN HFA) 108 (90 Base) MCG/ACT inhaler Inhale 2 puffs into the lungs every 4 (four) hours as needed for wheezing or shortness of breath. (Patient not taking: No sig reported)     No current facility-administered medications on file prior to visit.    Allergies  Allergen Reactions   Ace Inhibitors Cough       Observations/Objective: Today's Vitals   08/28/21 1223  PainSc: 0-No pain   There is no height or weight on file to calculate BMI.  Physical Exam Neurological:     Mental Status: He is alert.    CBC    Component Value Date/Time   WBC 7.7 08/21/2021 1126   RBC 3.71 (L) 08/21/2021 1126   HGB 11.8 (L) 08/21/2021 1126   HCT 35.1 (L) 08/21/2021 1126   PLT 205 08/21/2021 1126   MCV 94.6 08/21/2021 1126   MCH 31.8 08/21/2021 1126   MCHC 33.6 08/21/2021 1126   RDW 12.4 08/21/2021 1126   LYMPHSABS 1.3 08/21/2021 1126   MONOABS 0.5 08/21/2021 1126   EOSABS 0.2 08/21/2021 1126   BASOSABS 0.1 08/21/2021 1126    CMP     Component Value Date/Time   NA 136 08/21/2021 1126   K 3.5 08/21/2021 1126   CL 100 08/21/2021 1126   CO2 31 08/21/2021 1126   GLUCOSE 95 08/21/2021 1126   BUN 12 08/21/2021 1126   CREATININE 0.93 08/21/2021 1126   CALCIUM 8.7 (L) 08/21/2021 1126   PROT 6.8 08/21/2021 1126   ALBUMIN 4.1 08/21/2021 1126   AST 26 08/21/2021 1126   ALT 19 08/21/2021 1126    ALKPHOS 46 08/21/2021 1126   BILITOT 0.7 08/21/2021 1126   GFRNONAA >60 08/21/2021 1126   GFRAA >60 06/19/2020 0942     Assessment and Plan: 1. Prostate cancer ([place])   2. Androgen deprivation therapy   3. Encounter for antineoplastic chemotherapy     #Recurrent prostate cancer, castration sensitive local recurrence. Currently on Eligard every 6 months.  Due in November 2022.  We will schedule. Labs reviewed and discussed with patient To continue Abiateron 750 mg daily and prednisone 5 mg daily. Overall she tolerates well. Continue current  regimen. PSA has further decreased to 0.06.  #Hypertension, currently on hydrochlorothiazide 25 mg daily.  Added Norvasc, he currently is on 1.25 mg [half tablet] Continue monitor blood pressure at home.  If systolic BP is persistently less than 150, then he can hold Norvasc.  Follow Up Instructions: November 2022, Eligard x1. Lab MD CBC CMP PSA prior to visit in 3 months.   I discussed the assessment and treatment plan with the patient. The patient was provided an opportunity to ask questions and all were answered. The patient agreed with the plan and demonstrated an understanding of the instructions.  The patient was advised to call back or seek an in-person evaluation if the symptoms worsen or if the condition fails to improve as anticipated.   Earlie Server, MD 08/28/2021 10:33 PM

## 2021-08-28 NOTE — Progress Notes (Signed)
Pt confirmed availability for virtual visit as scheduled. Pt states he will sign on via computer. Pt states that the recent bp med he has been prescribed causes his bp to drop to 116/60s at bedtime. He reports cutting bp pill in half and bp has been 130-140/70s. No other concerns at this time.

## 2021-09-01 ENCOUNTER — Other Ambulatory Visit: Payer: Self-pay | Admitting: Pharmacist

## 2021-09-01 DIAGNOSIS — C61 Malignant neoplasm of prostate: Secondary | ICD-10-CM

## 2021-09-01 MED ORDER — ABIRATERONE ACETATE 250 MG PO TABS: 750.0000 mg | ORAL_TABLET | Freq: Every day | ORAL | 2 refills | Status: DC

## 2021-09-05 ENCOUNTER — Telehealth: Payer: Self-pay | Admitting: Pharmacy Technician

## 2021-09-05 NOTE — Telephone Encounter (Signed)
Oral Oncology Patient Advocate Encounter  Was successful in securing patient a $8,000 grant from [employer] to provide copayment coverage for Zytiga.  This will keep the out of pocket expense at $0.     [employer] ID: [id number]   The billing information is as follows and has been shared with [place].    RxBin: [id number] PCN: [id number] Member ID: [id number] Group ID: [id number] Dates of Eligibility: 08/04/21 through 08/03/22  Fund:  [place] Patient [place] Phone [phone] Fax [phone] 09/05/2021 12:39 PM

## 2021-09-08 ENCOUNTER — Encounter: Payer: Self-pay | Admitting: Oncology

## 2021-09-08 ENCOUNTER — Other Ambulatory Visit (HOSPITAL_COMMUNITY): Payer: Self-pay

## 2021-09-19 ENCOUNTER — Ambulatory Visit (INDEPENDENT_AMBULATORY_CARE_PROVIDER_SITE_OTHER): Payer: Medicare Other

## 2021-09-19 ENCOUNTER — Other Ambulatory Visit: Payer: Self-pay

## 2021-09-19 ENCOUNTER — Ambulatory Visit
Admission: RE | Admit: 2021-09-19 | Discharge: 2021-09-19 | Disposition: A | Discharge status: Home / Self Care | Payer: Medicare Other | Source: Ambulatory Visit | Attending: Medical Oncology | Admitting: Medical Oncology

## 2021-09-19 ENCOUNTER — Telehealth: Payer: Self-pay | Admitting: Pharmacy Technician

## 2021-09-19 VITALS — BP 151/68 | HR 76 | Temp 98.5°F | Resp 18

## 2021-09-19 DIAGNOSIS — M79672 Pain in left foot: Secondary | ICD-10-CM

## 2021-09-19 NOTE — ED Provider Notes (Signed)
[facility]    CSN: [id number] Arrival date & time: 09/19/21  1554      History   Chief Complaint Chief Complaint  Patient presents with   Foot Pain    L 4:00 APPT     HPI Lanson Randle is a 80 y.o. male.   HPI  Left foot Pain: Pt reports that 2 weeks ago a 4x4 piece of oak fell on his left foot near his toes. Initially was very painful. Now hurts mildly when he walks but he reports that he wears supportive walking shoes and this is minimal. He has most of his pain if he presses on the base of the 2nd and 3rd toes. He has not had to take anything recently for pain and does not request anything for pain. No numbness, tingling or weakness.   Past Medical History:  Diagnosis Date   Allergic rhinitis    Bladder cancer (HCC)    BPH (benign prostatic hyperplasia)    Chronic back pain    Essential hypertension    Glaucoma    Impotence of organic origin    Liver laceration    Prostate cancer ([place])    Stenosis, spinal, lumbar     Patient Active Problem List   Diagnosis Date Noted   Glaucoma (increased eye pressure) 07/24/2021   Goals of care, counseling/discussion 07/24/2021   Iron deficiency anemia 01/02/2021   B12 deficiency 02/20/2020   Normocytic anemia 02/20/2020   Nocturia 09/13/2018   Chronic idiopathic constipation 01/21/2017   Prostate cancer ([place]) 07/09/2016   High risk medication use 07/24/2015   Chronic back pain 07/12/2014   Allergic rhinitis, unspecified 07/11/2014   Essential hypertension 07/11/2014   Benign prostatic hyperplasia 07/11/2014   Hyperlipidemia 06/20/2014   ED (erectile dysfunction) of organic origin 09/26/2012   History of bladder cancer 09/26/2012   Displacement of lumbar intervertebral disc 08/03/2012   History of adenomatous polyp of colon 02/14/2005    Past Surgical History:  Procedure Laterality Date   BACK SURGERY     COLONOSCOPY     COLONOSCOPY WITH PROPOFOL N/A 08/24/2019   Procedure: COLONOSCOPY WITH PROPOFOL;   Surgeon: Lollie Sails, MD;  Location: [facility] ENDOSCOPY;  Service: Endoscopy;  Laterality: N/A;   HERNIA REPAIR     NEPHRECTOMY Right 1978   TRANSURETHRAL RESECTION OF BLADDER TUMOR WITH GYRUS (TURBT-GYRUS)         Home Medications    Prior to Admission medications   Medication Sig Start Date End Date Taking? Authorizing Provider  abiraterone acetate (ZYTIGA) 250 MG tablet Take 3 tablets (750 mg total) by mouth daily. Take on an empty stomach 1 hour before or 2 hours after a meal 09/01/21   Earlie Server, MD  albuterol (VENTOLIN HFA) 108 (90 Base) MCG/ACT inhaler Inhale 2 puffs into the lungs every 4 (four) hours as needed for wheezing or shortness of breath. Patient not taking: No sig reported    [provider]  amLODipine (NORVASC) 2.5 MG tablet Take 1 tablet (2.5 mg total) by mouth daily. 08/28/21   Earlie Server, MD  aspirin EC 81 MG tablet Take 81 mg by mouth daily.     [provider]  ferrous sulfate 325 (65 FE) MG tablet Take by mouth.    [provider]  fluticasone (FLONASE) 50 MCG/ACT nasal spray Place 1 spray into both nostrils daily as needed. 01/21/16   [provider]  hydrochlorothiazide (HYDRODIURIL) 25 MG tablet Take 25 mg by mouth daily.  07/24/15  [provider]  latanoprost (XALATAN) 0.005 % ophthalmic solution Place 1 drop into both eyes at bedtime.  03/07/19   [provider]  montelukast (SINGULAIR) 10 MG tablet Take 10 mg by mouth daily.  01/21/16   [provider]  Multiple Vitamin (MULTI-VITAMIN) tablet Take 1 tablet by mouth daily.    [provider]  predniSONE (DELTASONE) 5 MG tablet TAKE 1 TABLET BY MOUTH EVERY DAY WITH BREAKFAST 08/28/21   Earlie Server, MD  vitamin B-12 (CYANOCOBALAMIN) 1000 MCG tablet Take 1,000 mcg by mouth daily.     [provider]    Family History Family History  Problem Relation Age of Onset   Cancer Father     Social History Social History   Tobacco Use    Smoking status: Former    Types: Cigarettes    Quit date: 1965    Years since quitting: 57.8   Smokeless tobacco: Never  Vaping Use   Vaping Use: Never used  Substance Use Topics   Alcohol use: No   Drug use: No     Allergies   Ace inhibitors   Review of Systems Review of Systems  As stated above in HPI Physical Exam Triage Vital Signs ED Triage Vitals  Enc Vitals Group     BP 09/19/21 1624 (!) 151/68     Pulse Rate 09/19/21 1624 76     Resp 09/19/21 1624 18     Temp 09/19/21 1624 98.5 F (36.9 C)     Temp Source 09/19/21 1624 Oral     SpO2 09/19/21 1624 100 %     Weight --      Height --      Head Circumference --      Peak Flow --      Pain Score 09/19/21 1622 0     Pain Loc --      Pain Edu? --      Excl. in [place]? --    No data found.  Updated Vital Signs BP (!) 151/68 (BP Location: Right Arm)   Pulse 76   Temp 98.5 F (36.9 C) (Oral)   Resp 18   SpO2 100%   Physical Exam Vitals and nursing note reviewed.  Constitutional:      General: He is not in acute distress.    Appearance: Normal appearance. He is not ill-appearing, toxic-appearing or diaphoretic.  Cardiovascular:     Pulses: Normal pulses.  Musculoskeletal:        General: Swelling (mild of the left foot near base of toes) and tenderness (mild of the base of the left 2nd and 3rd toes) present. Normal range of motion.  Skin:    General: Skin is warm.     Capillary Refill: Capillary refill takes less than 2 seconds.     Coloration: Skin is not jaundiced or pale.     Findings: No bruising, erythema or rash.  Neurological:     Mental Status: He is alert and oriented to person, place, and time.     Motor: No weakness.     UC Treatments / Results  Labs (all labs ordered are listed, but only abnormal results are displayed) Labs Reviewed - No data to display  EKG   Radiology DG Foot Complete Left  Result Date: 09/19/2021 CLINICAL DATA:  Left foot tenderness and swelling after dropping  an oak 4x4 on the foot. Tenderness to palpation at the base of the second through fourth toes. EXAM: LEFT FOOT - COMPLETE 3+  VIEW COMPARISON:  None. FINDINGS: Plantar and Achilles calcaneal spurs. On the lateral view only, there is linear calcific density along the plantar bases of the proximal phalanges of the second and third toes suspicious for small avulsions or at least periostitis. The appearance is complicated by projecting through other bony structures on the lateral projection, but the appearance does raise suspicion for subtle fractures involving the bases of the proximal phalanges of the second and third toes. IMPRESSION: 1. Suspected avulsion or fracture of the plantar bases of the proximal phalanges of the second and third toe based on the lateral projection. Admittedly the findings are subtle in complicated by bony overlap. If further imaging diagnostic confidence is warranted, CT of the forefoot could be utilized for confirmation. 2. Plantar and Achilles calcaneal spurs. Electronically Signed   By: Van Clines M.D.   On: 09/19/2021 16:54    Procedures Procedures (including critical care time)  Medications Ordered in UC Medications - No data to display  Initial Impression / Assessment and Plan / UC Course  I have reviewed the triage vital signs and the nursing notes.  Pertinent labs & imaging results that were available during my care of the patient were reviewed by me and considered in my medical decision making (see chart for details).     New.  I discussed his x-ray findings with patient.  At this time he would like to hold off on a postop shoe and does not request any pain medication.  Purely he wanted to know if he did injure his toes.  For now I have recommended that he continue his supportive walking shoes as he does not want the postop shoe.  He asked if he can continue walking about 1 mile per day for exercise.  I stated that this was okay but I would prefer that he  complete this on a walking track that has cushion and support to make this more comfortable and that he should not push himself to the point where he is having discomfort.  We discussed if symptoms worsen or fail to improve that I would recommend he see a podiatrist or orthopedist.  In the meantime he is going to work to eat foods rich in calcium and vitamin D to help with fracture healing. Final Clinical Impressions(s) / UC Diagnoses   Final diagnoses:  None   Discharge Instructions   None    ED Prescriptions   None    PDMP not reviewed this encounter.   Hughie Closs, [place] 09/19/21 1717

## 2021-09-19 NOTE — Telephone Encounter (Signed)
Oral Oncology Patient Advocate Encounter   Was successful in securing patient an [phone] grant from [facility]) to provide copayment coverage for Zytiga.  This will keep the out of pocket expense at $0.     I have spoken with the patient.    The billing information is as follows and has been shared with [place].   Member ID: [id number] Group ID: [id number] RxBin: [id number] Dates of Eligibility: 06/21/21 through 09/18/22  Fund:  [place] [facility] [place] Phone [phone] Fax [phone] 09/19/2021 1:46 PM

## 2021-09-19 NOTE — ED Triage Notes (Signed)
Pt reports ongoing L foot tenderness and swelling after dropping a 4x4 piece of oak onto foot.  Pain primarily when he touches it, not when weight bearing.  Tenderness primarily at base of 2nd-4th toes.

## 2021-09-22 ENCOUNTER — Other Ambulatory Visit (HOSPITAL_COMMUNITY): Payer: Self-pay

## 2021-09-22 ENCOUNTER — Encounter: Payer: Self-pay | Admitting: Oncology

## 2021-09-29 ENCOUNTER — Other Ambulatory Visit: Payer: Self-pay | Admitting: Oncology

## 2021-10-01 ENCOUNTER — Encounter: Payer: Self-pay | Admitting: Oncology

## 2021-10-02 ENCOUNTER — Inpatient Hospital Stay: Payer: Medicare Other | Attending: Oncology

## 2021-10-02 ENCOUNTER — Other Ambulatory Visit: Payer: Self-pay

## 2021-10-02 VITALS — BP 167/71 | HR 72 | Temp 97.5°F | Resp 18

## 2021-10-02 DIAGNOSIS — C61 Malignant neoplasm of prostate: Secondary | ICD-10-CM | POA: Diagnosis not present

## 2021-10-02 MED ORDER — LEUPROLIDE ACETATE (6 MONTH) 45 MG ~~LOC~~ KIT
45.0000 mg | PACK | Freq: Once | SUBCUTANEOUS | Status: AC
  Administered 2021-10-02: 11:00:00 45 mg via SUBCUTANEOUS
  Filled 2021-10-02: qty 45

## 2021-10-03 ENCOUNTER — Ambulatory Visit: Payer: Medicare Other

## 2021-10-23 ENCOUNTER — Other Ambulatory Visit (HOSPITAL_COMMUNITY): Payer: Self-pay

## 2021-10-30 ENCOUNTER — Other Ambulatory Visit (HOSPITAL_COMMUNITY): Payer: Self-pay

## 2021-11-27 ENCOUNTER — Other Ambulatory Visit: Payer: Self-pay

## 2021-11-27 ENCOUNTER — Inpatient Hospital Stay: Payer: Medicare Other | Attending: Nurse Practitioner

## 2021-11-27 ENCOUNTER — Other Ambulatory Visit: Payer: Medicare Other

## 2021-11-27 DIAGNOSIS — Z79899 Other long term (current) drug therapy: Secondary | ICD-10-CM | POA: Diagnosis not present

## 2021-11-27 DIAGNOSIS — Z8551 Personal history of malignant neoplasm of bladder: Secondary | ICD-10-CM | POA: Insufficient documentation

## 2021-11-27 DIAGNOSIS — R7989 Other specified abnormal findings of blood chemistry: Secondary | ICD-10-CM | POA: Insufficient documentation

## 2021-11-27 DIAGNOSIS — Z905 Acquired absence of kidney: Secondary | ICD-10-CM | POA: Insufficient documentation

## 2021-11-27 DIAGNOSIS — I10 Essential (primary) hypertension: Secondary | ICD-10-CM | POA: Diagnosis not present

## 2021-11-27 DIAGNOSIS — C61 Malignant neoplasm of prostate: Secondary | ICD-10-CM | POA: Diagnosis present

## 2021-11-27 DIAGNOSIS — Z7982 Long term (current) use of aspirin: Secondary | ICD-10-CM | POA: Diagnosis not present

## 2021-11-27 DIAGNOSIS — Z87891 Personal history of nicotine dependence: Secondary | ICD-10-CM | POA: Insufficient documentation

## 2021-11-27 DIAGNOSIS — Z7952 Long term (current) use of systemic steroids: Secondary | ICD-10-CM | POA: Diagnosis not present

## 2021-11-27 LAB — CBC WITH DIFFERENTIAL/PLATELET
Abs Immature Granulocytes: 0.02 10*3/uL (ref 0.00–0.07)
Basophils Absolute: 0.1 10*3/uL (ref 0.0–0.1)
Basophils Relative: 1 %
Eosinophils Absolute: 0.1 10*3/uL (ref 0.0–0.5)
Eosinophils Relative: 2 %
HCT: 41.8 % (ref 39.0–52.0)
Hemoglobin: 13.9 g/dL (ref 13.0–17.0)
Immature Granulocytes: 0 %
Lymphocytes Relative: 25 %
Lymphs Abs: 2.1 10*3/uL (ref 0.7–4.0)
MCH: 32.1 pg (ref 26.0–34.0)
MCHC: 33.3 g/dL (ref 30.0–36.0)
MCV: 96.5 fL (ref 80.0–100.0)
Monocytes Absolute: 0.6 10*3/uL (ref 0.1–1.0)
Monocytes Relative: 7 %
Neutro Abs: 5.5 10*3/uL (ref 1.7–7.7)
Neutrophils Relative %: 65 %
Platelets: 197 10*3/uL (ref 150–400)
RBC: 4.33 MIL/uL (ref 4.22–5.81)
RDW: 12.2 % (ref 11.5–15.5)
WBC: 8.5 10*3/uL (ref 4.0–10.5)
nRBC: 0 % (ref 0.0–0.2)

## 2021-11-27 LAB — COMPREHENSIVE METABOLIC PANEL
ALT: 51 U/L — ABNORMAL HIGH (ref 0–44)
AST: 43 U/L — ABNORMAL HIGH (ref 15–41)
Albumin: 4.3 g/dL (ref 3.5–5.0)
Alkaline Phosphatase: 54 U/L (ref 38–126)
Anion gap: 7 (ref 5–15)
BUN: 11 mg/dL (ref 8–23)
CO2: 32 mmol/L (ref 22–32)
Calcium: 9 mg/dL (ref 8.9–10.3)
Chloride: 99 mmol/L (ref 98–111)
Creatinine, Ser: 0.81 mg/dL (ref 0.61–1.24)
GFR, Estimated: >60 mL/min (ref >60)
Glucose, Bld: 107 mg/dL — ABNORMAL HIGH (ref 70–99)
Potassium: 3.7 mmol/L (ref 3.5–5.1)
Sodium: 138 mmol/L (ref 135–145)
Total Bilirubin: 0.9 mg/dL (ref 0.3–1.2)
Total Protein: 7 g/dL (ref 6.5–8.1)

## 2021-11-27 LAB — PSA: Prostatic Specific Antigen: 0.03 ng/mL (ref 0.00–4.00)

## 2021-12-01 ENCOUNTER — Other Ambulatory Visit: Payer: Self-pay

## 2021-12-01 ENCOUNTER — Inpatient Hospital Stay (HOSPITAL_BASED_OUTPATIENT_CLINIC_OR_DEPARTMENT_OTHER): Payer: Medicare Other | Admitting: Nurse Practitioner

## 2021-12-01 ENCOUNTER — Ambulatory Visit: Payer: Medicare Other | Admitting: Oncology

## 2021-12-01 VITALS — BP 141/66 | HR 80 | Temp 97.3°F | Resp 16 | Wt 221.7 lb

## 2021-12-01 DIAGNOSIS — R7989 Other specified abnormal findings of blood chemistry: Secondary | ICD-10-CM

## 2021-12-01 DIAGNOSIS — C61 Malignant neoplasm of prostate: Secondary | ICD-10-CM | POA: Diagnosis not present

## 2021-12-01 NOTE — Progress Notes (Signed)
HEMATOLOGY-ONCOLOGY  PROGRESS NOTE   Chief Complaint: Follow-up for prostate cancer  INTERVAL HISTORY James Lucas is a 80 y.o. male with history of prostate cancer currently on androgen deprivation therapy as well as abiraterone plus prednisone.  Continues to tolerate treatment well without significant side effects.  Blood pressures been better controlled.  He denies specific complaints today.  Review of Systems  Constitutional:  Negative for appetite change, chills, fever and unexpected weight change.  HENT:   Negative for hearing loss and voice change.   Eyes:  Negative for eye problems and icterus.  Respiratory:  Negative for chest tightness, cough and shortness of breath.   Cardiovascular:  Negative for chest pain and leg swelling.  Gastrointestinal:  Negative for abdominal distention and abdominal pain.  Endocrine: Negative for hot flashes.  Genitourinary:  Negative for difficulty urinating, dysuria and frequency.   Musculoskeletal:  Negative for arthralgias.  Skin:  Negative for itching and rash.  Neurological:  Negative for light-headedness and numbness.  Hematological:  Negative for adenopathy. Does not bruise/bleed easily.  Psychiatric/Behavioral:  Negative for confusion.     Past Medical History:  Diagnosis Date   Allergic rhinitis    Bladder cancer (HCC)    BPH (benign prostatic hyperplasia)    Chronic back pain    Essential hypertension    Glaucoma    Impotence of organic origin    Liver laceration    Prostate cancer ([place])    Stenosis, spinal, lumbar    Past Surgical History:  Procedure Laterality Date   BACK SURGERY     COLONOSCOPY     COLONOSCOPY WITH PROPOFOL N/A 08/24/2019   Procedure: COLONOSCOPY WITH PROPOFOL;  Surgeon: Lollie Sails, MD;  Location: [facility] ENDOSCOPY;  Service: Endoscopy;  Laterality: N/A;   HERNIA REPAIR     NEPHRECTOMY Right 1978   TRANSURETHRAL RESECTION OF BLADDER TUMOR WITH GYRUS (TURBT-GYRUS)      Family History  Problem  Relation Age of Onset   Cancer Father     Social History   Socioeconomic History   Marital status: Married    Spouse name: Not on file   Number of children: Not on file   Years of education: Not on file   Highest education level: Not on file  Occupational History   Not on file  Tobacco Use   Smoking status: Former    Types: Cigarettes    Quit date: 1965    Years since quitting: 58.0   Smokeless tobacco: Never  Vaping Use   Vaping Use: Never used  Substance and Sexual Activity   Alcohol use: No   Drug use: No   Sexual activity: Not on file  Other Topics Concern   Not on file  Social History Narrative   Not on file   Social Determinants of Health   Financial Resource Strain: Not on file  Food Insecurity: Not on file  Transportation Needs: Not on file  Physical Activity: Not on file  Stress: Not on file  Social Connections: Not on file  Intimate Partner Violence: Not on file    Current Outpatient Medications on File Prior to Visit  Medication Sig Dispense Refill   abiraterone acetate (ZYTIGA) 250 MG tablet Take 3 tablets (750 mg total) by mouth daily. Take on an empty stomach 1 hour before or 2 hours after a meal 90 tablet 2   aspirin EC 81 MG tablet Take 81 mg by mouth daily.      ferrous sulfate 325 (65 FE) MG  tablet Take by mouth.     hydrochlorothiazide (HYDRODIURIL) 25 MG tablet Take 25 mg by mouth daily.      latanoprost (XALATAN) 0.005 % ophthalmic solution Place 1 drop into both eyes at bedtime.      montelukast (SINGULAIR) 10 MG tablet Take 10 mg by mouth daily.      Multiple Vitamin (MULTI-VITAMIN) tablet Take 1 tablet by mouth daily.     predniSONE (DELTASONE) 5 MG tablet TAKE 1 TABLET BY MOUTH EVERY DAY WITH BREAKFAST 90 tablet 1   vitamin B-12 (CYANOCOBALAMIN) 1000 MCG tablet Take 1,000 mcg by mouth daily.      albuterol (VENTOLIN HFA) 108 (90 Base) MCG/ACT inhaler Inhale 2 puffs into the lungs every 4 (four) hours as needed for wheezing or shortness of  breath. (Patient not taking: Reported on 10/03/2020)     amLODipine (NORVASC) 2.5 MG tablet TAKE 1 TABLET DAILY (Patient not taking: Reported on 12/01/2021) 30 tablet 1   fluticasone (FLONASE) 50 MCG/ACT nasal spray Place 1 spray into both nostrils daily as needed. (Patient not taking: Reported on 12/01/2021)     No current facility-administered medications on file prior to visit.    Allergies  Allergen Reactions   Ace Inhibitors Cough       Observations/Objective: Today's Vitals   12/01/21 1112  BP: (!) 141/66  Pulse: 80  Resp: 16  Temp: (!) 97.3 F (36.3 C)  SpO2: 100%  Weight: 221 lb 11.2 oz (100.6 kg)  PainSc: 0-No pain   Body mass index is 30.07 kg/m.  Physical Exam Constitutional:      Appearance: He is not ill-appearing.  Eyes:     General: No scleral icterus.    Conjunctiva/sclera: Conjunctivae normal.  Cardiovascular:     Rate and Rhythm: Normal rate and regular rhythm.  Abdominal:     General: There is no distension.     Palpations: Abdomen is soft.     Tenderness: There is no abdominal tenderness. There is no guarding.  Musculoskeletal:        General: No deformity.     Right lower leg: No edema.     Left lower leg: No edema.  Lymphadenopathy:     Cervical: No cervical adenopathy.  Skin:    General: Skin is warm and dry.  Neurological:     Mental Status: He is alert and oriented to person, place, and time. Mental status is at baseline.  Psychiatric:        Mood and Affect: Mood normal.        Behavior: Behavior normal.    CBC    Component Value Date/Time   WBC 8.5 11/27/2021 1054   RBC 4.33 11/27/2021 1054   HGB 13.9 11/27/2021 1054   HCT 41.8 11/27/2021 1054   PLT 197 11/27/2021 1054   MCV 96.5 11/27/2021 1054   MCH 32.1 11/27/2021 1054   MCHC 33.3 11/27/2021 1054   RDW 12.2 11/27/2021 1054   LYMPHSABS 2.1 11/27/2021 1054   MONOABS 0.6 11/27/2021 1054   EOSABS 0.1 11/27/2021 1054   BASOSABS 0.1 11/27/2021 1054    CMP     Component  Value Date/Time   NA 138 11/27/2021 1054   K 3.7 11/27/2021 1054   CL 99 11/27/2021 1054   CO2 32 11/27/2021 1054   GLUCOSE 107 (H) 11/27/2021 1054   BUN 11 11/27/2021 1054   CREATININE 0.81 11/27/2021 1054   CALCIUM 9.0 11/27/2021 1054   PROT 7.0 11/27/2021 1054   ALBUMIN 4.3 11/27/2021  1054   AST 43 (H) 11/27/2021 1054   ALT 51 (H) 11/27/2021 1054   ALKPHOS 54 11/27/2021 1054   BILITOT 0.9 11/27/2021 1054   GFRNONAA >60 11/27/2021 1054   GFRAA >60 06/19/2020 0942    Component Ref Range & Units 4 d ago 3 mo ago 4 mo ago 5 mo ago 8 mo ago 11 mo ago 1 yr ago  Prostatic Specific Antigen 0.00 - 4.00 ng/mL 0.03  0.06 CM  0.24 CM  0.31 CM  0.33 CM  0.55 CM  0.65      Assessment and Plan: No diagnosis found.  Recurrent prostate cancer- castrate sensitive, local recurrence. Currently on eligard every 6 months. Last November 2022. Next May 2022. Labs today reviewed- LFTs slightly elevated. He is asymptomatic. Continue zytiga 750 mg daily and prednisone 5 mg daily. Will plan to recheck LFTs in a month to evaluate if rising. If so, suspect zytiga is likely etiology. Would consider ultrasound. PSA continues to decrease to 0.03 so I feel that disease is less likely.  Hypertension- currently on hydrochlorothiazide 25 mg daily.  Added Norvasc, he currently is on 1.25 mg [half tablet]. Continue monitor blood pressure at home.  If systolic BP is persistently less than 150, then he can hold Norvasc. Elevated LFTs- etiology unclear. Possibly related to zytiga. Recheck in a month.   Follow Up Instructions: 1 mo- lab only (LFTs) 3 mo- lab (cbc, cmp, psa) Day to week later see Dr Tasia Catchings, +/- eligard- la  I discussed the assessment and treatment plan with the patient. The patient was provided an opportunity to ask questions and all were answered. The patient agreed with the plan and demonstrated an understanding of the instructions.   The patient was advised to call back or seek an in-person evaluation if  the symptoms worsen or if the condition fails to improve as anticipated.   Verlon Au, NP 12/01/2021

## 2021-12-19 ENCOUNTER — Other Ambulatory Visit (HOSPITAL_COMMUNITY): Payer: Self-pay

## 2021-12-22 ENCOUNTER — Other Ambulatory Visit: Payer: Self-pay | Admitting: *Deleted

## 2021-12-22 DIAGNOSIS — C61 Malignant neoplasm of prostate: Secondary | ICD-10-CM

## 2021-12-22 DIAGNOSIS — R7989 Other specified abnormal findings of blood chemistry: Secondary | ICD-10-CM

## 2021-12-29 ENCOUNTER — Inpatient Hospital Stay: Payer: Medicare Other | Attending: Oncology

## 2021-12-29 ENCOUNTER — Other Ambulatory Visit: Payer: Self-pay

## 2021-12-29 DIAGNOSIS — Z87891 Personal history of nicotine dependence: Secondary | ICD-10-CM | POA: Insufficient documentation

## 2021-12-29 DIAGNOSIS — R7989 Other specified abnormal findings of blood chemistry: Secondary | ICD-10-CM | POA: Diagnosis not present

## 2021-12-29 DIAGNOSIS — C61 Malignant neoplasm of prostate: Secondary | ICD-10-CM | POA: Diagnosis present

## 2021-12-29 LAB — HEPATIC FUNCTION PANEL
ALT: 71 U/L — ABNORMAL HIGH (ref 0–44)
AST: 57 U/L — ABNORMAL HIGH (ref 15–41)
Albumin: 4.1 g/dL (ref 3.5–5.0)
Alkaline Phosphatase: 49 U/L (ref 38–126)
Bilirubin, Direct: 0.2 mg/dL (ref 0.0–0.2)
Indirect Bilirubin: 0.6 mg/dL (ref 0.3–0.9)
Total Bilirubin: 0.8 mg/dL (ref 0.3–1.2)
Total Protein: 6.7 g/dL (ref 6.5–8.1)

## 2021-12-30 ENCOUNTER — Other Ambulatory Visit: Payer: Self-pay

## 2021-12-30 ENCOUNTER — Telehealth: Payer: Self-pay | Admitting: *Deleted

## 2021-12-30 DIAGNOSIS — C61 Malignant neoplasm of prostate: Secondary | ICD-10-CM

## 2021-12-30 DIAGNOSIS — R7401 Elevation of levels of liver transaminase levels: Secondary | ICD-10-CM

## 2021-12-30 DIAGNOSIS — R7989 Other specified abnormal findings of blood chemistry: Secondary | ICD-10-CM

## 2021-12-30 NOTE — Telephone Encounter (Signed)
Spoke to patient and informed that Dr. Tasia Catchings has reviewed labs and that levels are not high enough to hold medication, so he should continue taking Zytiga. Patient denies staring any new medications, alcohol use or upper respiratory infection. Also, informed him that Dr. Tasia Catchings would like to repeat labwork in 3 weeks for monitor and get and US liver for further eval. Pt verbalized understanding.   Please schedule & inform pt:   US liver -next avail Labs in 3 weeks (hep panel , lft)

## 2021-12-30 NOTE — Telephone Encounter (Signed)
Patient had recent labs and sees that his liver enzymes are up and is asking if he should continue taking his medicine or stop it. Please advise.  Hepatic function panel Order: [id number] Status: Final result    Visible to patient: Yes (seen)    Next appt: 01/22/2022 at 10:15 AM in Oncology ([facility])    Dx: Prostate cancer ([place]); Elevated LFTs    0 Result Notes           Component Ref Range & Units 1 d ago 1 mo ago 4 mo ago 5 mo ago 6 mo ago 9 mo ago 12 mo ago  Total Protein 6.5 - 8.1 g/dL 6.7  7.0  6.8  7.3  7.3  7.2  7.1   Albumin 3.5 - 5.0 g/dL 4.1  4.3  4.1  4.0  4.3  4.2  4.2   AST 15 - 41 U/L 57 High   43 High   26  21  23  19  22    ALT 0 - 44 U/L 71 High   51 High   19  18  16  13  16    Alkaline Phosphatase 38 - 126 U/L 49  54  46  50  48  51  44   Total Bilirubin 0.3 - 1.2 mg/dL 0.8  0.9  0.7  0.6  0.7  0.9  0.7   Bilirubin, Direct 0.0 - 0.2 mg/dL 0.2         Indirect Bilirubin 0.3 - 0.9 mg/dL 0.6         Comment: Performed at [facility], [address] [place], [place] [address]  Resulting Agency  [facility] [place] [facility] [place] [facility] [place] [facility] [place] [facility] [place] [facility] [place] [facility]         Specimen Collected: 12/29/21 11:40 Last Resulted: 12/29/21 12:04

## 2021-12-31 ENCOUNTER — Other Ambulatory Visit: Payer: Self-pay | Admitting: *Deleted

## 2022-01-01 ENCOUNTER — Other Ambulatory Visit: Payer: Medicare Other

## 2022-01-05 ENCOUNTER — Ambulatory Visit
Admission: RE | Admit: 2022-01-05 | Discharge: 2022-01-05 | Disposition: A | Discharge status: Home / Self Care | Payer: Medicare Other | Source: Ambulatory Visit | Attending: Oncology | Admitting: Oncology

## 2022-01-05 DIAGNOSIS — R7989 Other specified abnormal findings of blood chemistry: Secondary | ICD-10-CM | POA: Diagnosis not present

## 2022-01-20 ENCOUNTER — Other Ambulatory Visit: Payer: Self-pay

## 2022-01-20 ENCOUNTER — Inpatient Hospital Stay: Payer: Medicare Other | Attending: Nurse Practitioner

## 2022-01-20 DIAGNOSIS — R945 Abnormal results of liver function studies: Secondary | ICD-10-CM | POA: Insufficient documentation

## 2022-01-20 DIAGNOSIS — Z9221 Personal history of antineoplastic chemotherapy: Secondary | ICD-10-CM | POA: Insufficient documentation

## 2022-01-20 DIAGNOSIS — I10 Essential (primary) hypertension: Secondary | ICD-10-CM | POA: Diagnosis not present

## 2022-01-20 DIAGNOSIS — Z87891 Personal history of nicotine dependence: Secondary | ICD-10-CM | POA: Diagnosis not present

## 2022-01-20 DIAGNOSIS — K7689 Other specified diseases of liver: Secondary | ICD-10-CM | POA: Insufficient documentation

## 2022-01-20 DIAGNOSIS — C61 Malignant neoplasm of prostate: Secondary | ICD-10-CM | POA: Insufficient documentation

## 2022-01-20 DIAGNOSIS — R7989 Other specified abnormal findings of blood chemistry: Secondary | ICD-10-CM | POA: Insufficient documentation

## 2022-01-20 DIAGNOSIS — Z79899 Other long term (current) drug therapy: Secondary | ICD-10-CM | POA: Diagnosis not present

## 2022-01-20 DIAGNOSIS — R7401 Elevation of levels of liver transaminase levels: Secondary | ICD-10-CM

## 2022-01-20 LAB — HEPATIC FUNCTION PANEL
ALT: 47 U/L — ABNORMAL HIGH (ref 0–44)
AST: 43 U/L — ABNORMAL HIGH (ref 15–41)
Albumin: 4.1 g/dL (ref 3.5–5.0)
Alkaline Phosphatase: 50 U/L (ref 38–126)
Bilirubin, Direct: 0.2 mg/dL (ref 0.0–0.2)
Indirect Bilirubin: 0.8 mg/dL (ref 0.3–0.9)
Total Bilirubin: 1 mg/dL (ref 0.3–1.2)
Total Protein: 6.6 g/dL (ref 6.5–8.1)

## 2022-01-20 LAB — HEPATITIS PANEL, ACUTE
HCV Ab: NONREACTIVE
Hep A IgM: NONREACTIVE
Hep B C IgM: NONREACTIVE
Hepatitis B Surface Ag: NONREACTIVE

## 2022-01-20 LAB — PSA: Prostatic Specific Antigen: 0.02 ng/mL (ref 0.00–4.00)

## 2022-01-22 ENCOUNTER — Other Ambulatory Visit: Payer: Medicare Other

## 2022-01-28 ENCOUNTER — Other Ambulatory Visit: Payer: Self-pay | Admitting: Oncology

## 2022-01-29 ENCOUNTER — Other Ambulatory Visit: Payer: Self-pay

## 2022-01-29 ENCOUNTER — Ambulatory Visit
Admission: RE | Admit: 2022-01-29 | Discharge: 2022-01-29 | Disposition: A | Discharge status: Home / Self Care | Payer: Medicare Other | Source: Ambulatory Visit | Attending: Radiation Oncology | Admitting: Radiation Oncology

## 2022-01-29 ENCOUNTER — Encounter: Payer: Self-pay | Admitting: Radiation Oncology

## 2022-01-29 VITALS — BP 150/75 | HR 79 | Temp 97.5°F | Ht 73.0 in | Wt 225.5 lb

## 2022-01-29 NOTE — Progress Notes (Signed)
Radiation Oncology ?Follow up Note ? ?Name: James Lucas   ?Date:   01/29/2022 ?MRN:  [id number] ?DOB: 1941-02-19  ? ? ?This 81 y.o. male presents to the clinic today for 23-monthfollow-up status post IMRT radiation therapy in a salvage mode for patient now out 12 years status post cryotherapy for Gleason 9 adenocarcinoma the prostate. ? ?REFERRING PROVIDER: GSallee Lange * ? ?HPI: Patient is an 81year old male now at 18 months having completed salvage radiation therapy status post cryotherapy with biochemical failure for Gleason 9 adenocarcinoma.  He is seen today in routine follow-up is doing well specifically denies any increased lower urinary tract symptoms diarrhea or fatigue..Marland Kitchen He is currently on Eligard every 6 months.  His most recent PSA is 0.02 patient is also currently onAbiateron 750 mg daily and prednisone 5 mg daily. ? ?COMPLICATIONS OF TREATMENT: none ? ?FOLLOW UP COMPLIANCE: keeps appointments  ? ?PHYSICAL EXAM:  ?BP (!) 150/75   Pulse 79   Temp (!) 97.5 ?F (36.4 ?C)   Ht '6\' 1"'$  (1.854 m)   Wt 225 lb 8 oz (102.3 kg)   BMI 29.75 kg/m?  ?Well-developed well-nourished patient in NAD. HEENT reveals PERLA, EOMI, discs not visualized.  Oral cavity is clear. No oral mucosal lesions are identified. Neck is clear without evidence of cervical or supraclavicular adenopathy. Lungs are clear to A&P. Cardiac examination is essentially unremarkable with regular rate and rhythm without murmur rub or thrill. Abdomen is benign with no organomegaly or masses noted. Motor sensory and DTR levels are equal and symmetric in the upper and lower extremities. Cranial nerves II through XII are grossly intact. Proprioception is intact. No peripheral adenopathy or edema is identified. No motor or sensory levels are noted. Crude visual fields are within normal range. ? ?RADIOLOGY RESULTS: No current films for review ? ?PLAN: Present time patient is under excellent biochemical control of his prostate cancer.   Continues under management by medical oncology.  Currently on ADT therapy.  I have asked to see him back in 1 year for follow-up.  Patient is to call with any concerns. ? ?I would like to take this opportunity to thank you for allowing me to participate in the care of your patient.. ?  ? GNoreene Filbert MD ? ?

## 2022-02-02 ENCOUNTER — Other Ambulatory Visit (HOSPITAL_COMMUNITY): Payer: Self-pay

## 2022-02-02 ENCOUNTER — Other Ambulatory Visit: Payer: Self-pay | Admitting: Pharmacist

## 2022-02-02 ENCOUNTER — Encounter: Payer: Self-pay | Admitting: Oncology

## 2022-02-02 DIAGNOSIS — C61 Malignant neoplasm of prostate: Secondary | ICD-10-CM

## 2022-02-02 MED ORDER — ABIRATERONE ACETATE 250 MG PO TABS: 750.0000 mg | ORAL_TABLET | Freq: Every day | ORAL | 0 refills | Status: DC

## 2022-02-02 NOTE — Progress Notes (Signed)
Patient required to use [facility] to fill his abiraterone. He has grant to cover his copay. He will run out on medication prior to his next office visit. Refill sent to [facility] to ensure continuation of thereapy. ?

## 2022-02-04 ENCOUNTER — Encounter: Payer: Self-pay | Admitting: Oncology

## 2022-02-20 ENCOUNTER — Inpatient Hospital Stay: Payer: Medicare Other | Attending: Nurse Practitioner

## 2022-02-20 DIAGNOSIS — I10 Essential (primary) hypertension: Secondary | ICD-10-CM | POA: Insufficient documentation

## 2022-02-20 DIAGNOSIS — Z7952 Long term (current) use of systemic steroids: Secondary | ICD-10-CM | POA: Diagnosis not present

## 2022-02-20 DIAGNOSIS — Z87891 Personal history of nicotine dependence: Secondary | ICD-10-CM | POA: Insufficient documentation

## 2022-02-20 DIAGNOSIS — Z8551 Personal history of malignant neoplasm of bladder: Secondary | ICD-10-CM | POA: Insufficient documentation

## 2022-02-20 DIAGNOSIS — Z79899 Other long term (current) drug therapy: Secondary | ICD-10-CM | POA: Insufficient documentation

## 2022-02-20 DIAGNOSIS — C61 Malignant neoplasm of prostate: Secondary | ICD-10-CM | POA: Insufficient documentation

## 2022-02-20 DIAGNOSIS — R7989 Other specified abnormal findings of blood chemistry: Secondary | ICD-10-CM

## 2022-02-20 DIAGNOSIS — Z905 Acquired absence of kidney: Secondary | ICD-10-CM | POA: Diagnosis not present

## 2022-02-20 DIAGNOSIS — Z7982 Long term (current) use of aspirin: Secondary | ICD-10-CM | POA: Diagnosis not present

## 2022-02-20 LAB — CBC WITH DIFFERENTIAL/PLATELET
Abs Immature Granulocytes: 0.04 10*3/uL (ref 0.00–0.07)
Basophils Absolute: 0.1 10*3/uL (ref 0.0–0.1)
Basophils Relative: 1 %
Eosinophils Absolute: 0.2 10*3/uL (ref 0.0–0.5)
Eosinophils Relative: 2 %
HCT: 39.8 % (ref 39.0–52.0)
Hemoglobin: 12.8 g/dL — ABNORMAL LOW (ref 13.0–17.0)
Immature Granulocytes: 0 %
Lymphocytes Relative: 18 %
Lymphs Abs: 1.7 10*3/uL (ref 0.7–4.0)
MCH: 31.8 pg (ref 26.0–34.0)
MCHC: 32.2 g/dL (ref 30.0–36.0)
MCV: 98.8 fL (ref 80.0–100.0)
Monocytes Absolute: 0.7 10*3/uL (ref 0.1–1.0)
Monocytes Relative: 7 %
Neutro Abs: 6.8 10*3/uL (ref 1.7–7.7)
Neutrophils Relative %: 72 %
Platelets: 175 10*3/uL (ref 150–400)
RBC: 4.03 MIL/uL — ABNORMAL LOW (ref 4.22–5.81)
RDW: 12.6 % (ref 11.5–15.5)
WBC: 9.5 10*3/uL (ref 4.0–10.5)
nRBC: 0 % (ref 0.0–0.2)

## 2022-02-20 LAB — COMPREHENSIVE METABOLIC PANEL
ALT: 27 U/L (ref 0–44)
AST: 30 U/L (ref 15–41)
Albumin: 4.1 g/dL (ref 3.5–5.0)
Alkaline Phosphatase: 50 U/L (ref 38–126)
Anion gap: 7 (ref 5–15)
BUN: 10 mg/dL (ref 8–23)
CO2: 31 mmol/L (ref 22–32)
Calcium: 8.8 mg/dL — ABNORMAL LOW (ref 8.9–10.3)
Chloride: 99 mmol/L (ref 98–111)
Creatinine, Ser: 0.94 mg/dL (ref 0.61–1.24)
GFR, Estimated: >60 mL/min (ref >60)
Glucose, Bld: 94 mg/dL (ref 70–99)
Potassium: 3.8 mmol/L (ref 3.5–5.1)
Sodium: 137 mmol/L (ref 135–145)
Total Bilirubin: 0.8 mg/dL (ref 0.3–1.2)
Total Protein: 6.6 g/dL (ref 6.5–8.1)

## 2022-02-20 LAB — PSA: Prostatic Specific Antigen: <0.01 ng/mL (ref 0.00–4.00)

## 2022-02-22 ENCOUNTER — Other Ambulatory Visit: Payer: Self-pay | Admitting: Pharmacist

## 2022-02-22 DIAGNOSIS — C61 Malignant neoplasm of prostate: Secondary | ICD-10-CM

## 2022-02-23 ENCOUNTER — Encounter: Payer: Self-pay | Admitting: Oncology

## 2022-02-23 ENCOUNTER — Inpatient Hospital Stay: Payer: Medicare Other

## 2022-02-23 ENCOUNTER — Inpatient Hospital Stay (HOSPITAL_BASED_OUTPATIENT_CLINIC_OR_DEPARTMENT_OTHER): Payer: Medicare Other | Admitting: Oncology

## 2022-02-23 VITALS — BP 148/67 | HR 84 | Temp 97.1°F | Resp 16 | Ht 73.0 in | Wt 230.0 lb

## 2022-02-23 DIAGNOSIS — Z79818 Long term (current) use of other agents affecting estrogen receptors and estrogen levels: Secondary | ICD-10-CM

## 2022-02-23 DIAGNOSIS — Z5111 Encounter for antineoplastic chemotherapy: Secondary | ICD-10-CM

## 2022-02-23 DIAGNOSIS — C61 Malignant neoplasm of prostate: Secondary | ICD-10-CM | POA: Diagnosis not present

## 2022-02-23 DIAGNOSIS — IMO0001 Reserved for inherently not codable concepts without codable children: Secondary | ICD-10-CM

## 2022-02-23 MED ORDER — OYSTER SHELL CALCIUM/D3 500-5 MG-MCG PO TABS: 1.0000 | ORAL_TABLET | Freq: Every day | ORAL | 3 refills | Status: AC

## 2022-02-23 MED ORDER — PREDNISONE 5 MG PO TABS: ORAL_TABLET | ORAL | 1 refills | Status: DC

## 2022-02-23 NOTE — Progress Notes (Signed)
?Hematology Oncology Progress Note ? ? ?Clinic Day:  02/23/2022  ? ?Referring physician: Sallee Lange, * ? ?Chief Complaint: James Lucas is a 81 y.o. male with a long-standing history of prostate cancer who presents for follow up.  ? ?PERTINENT ONCOLOGY HISTORY ?Patient previously followed up by Dr.Corcoran, patient switched care to me on 07/03/21 ?Extensive medical record review was performed by me ?Per note,  ?# 2009 prostate cancer .  Gleason score was 4+3 in 2009.  He received cryoablation. ?# 2010 PSA rose to 1.4, biochemical recurrence.  Prostate biopsy in 04/2009 revealed Gleason 5+4 adenocarcinoma.  He received IMRT with adjuvant androgen deprivation therapy.  PSA nadir was < 0.1. ? ?05/23/2014  Prostate MRI revealed areas suspicious for extracapsular extension in the right posterior apex and along the posterior aspect of the anterior urethra.  ADT was discussed but due to history of side effects he elected to continue surveillance. ? ?#2016 PSA increased and he was started on Lupron, intermittently secondary to side effects.  ?# 01/2016 PSA increased to 3.64 ?05/01/2016 CT revealed no evidence of recurrent or metastatic disease. Bone scan on 05/01/2016 showed no evidence of osseous metastatic disease ?# 07/10/2016 Seen by Dr.Finnegan, started on Leupron.  ?# 10/26/2016 Dr. Beryle Lathe at [facility] recommended discontinuation of Lupron as his PSA was < 4.0. He was to pursue intermittent therapy when PSA increased to about 10.  Testosterone and PSA checks were recommended every 2 months and PET/CT scan with F-18 fluciclovine to assess for oligomenorrhea static disease if PSA began to rise. ? ?12/02/2017 Axumin PET scan revealed potential local recurrence in the residual left prostate gland, with the region of activity relatively small at approximately 1.5 cm. No evidence of metastatic disease in the abdomen pelvis or skeleton ?09/02/2018 by Dr. Grayland Ormond.  At that time, PSA was 5.98.  He agreed to  reinitiate Lupron (received 09/12/2018). ? ?07/02/2020 PET Axumin scan on  revealed intense radiotracer activity in the LEFT prostate gland concern for residual carcinoma.  There was no significant change from PET-CT 09/09/2018.  There was no nodal metastasis or skeletal metastasis.  He is s/p RIGHT nephrectomy. ? ?08/22/2020 revealed prostatic adenocarcinoma in 9/12 (six from the left side; 3 from the right side) biopsies.  The tumor showed mixed morphology with some areas showing ADT treatment effect and other areas without significant treatment effect.  Pattern was Gleason 4 and 5 in areas of less treatment effect.  The background prostate was fibrotic c/w radiation effect. ? ? ?He has a history of low-grade urothelial carcinoma of the bladder diagnosed in 2004 with recurrences in 2005 and 2007.  He undergoes surveillance cystoscopy by Dr. Alto Denver [facility]).   Last cystoscopy was normal on 01/12/2019. ? ?on Eligard '45mg'$  Q6 months. Last received on 04/02/2021 ?07/10/2021, patient started on Abiateron 750 mg daily and prednisone 5 mg daily. ? ?Interval History:  ?INTERVAL HISTORY ?James Lucas is a 81 y.o. male who has above history reviewed by me today presents for follow up visit for management of prostate cancer ? ?He is on Abiaterone and prednisone, tolerates well.  ?Blood pressure is well controlled.  ? ? ?Review of Systems  ?Constitutional:  Positive for fatigue. Negative for appetite change, chills, fever and unexpected weight change.  ?HENT:   Negative for hearing loss and voice change.   ?Eyes:  Negative for eye problems and icterus.  ?Respiratory:  Negative for chest tightness, cough and shortness of breath.   ?Cardiovascular:  Negative for chest pain and leg  swelling.  ?Gastrointestinal:  Negative for abdominal distention and abdominal pain.  ?Endocrine: Positive for hot flashes.  ?Genitourinary:  Negative for difficulty urinating, dysuria and frequency.   ?Skin:  Negative for itching and rash.   ?Neurological:  Negative for light-headedness and numbness.  ?Hematological:  Negative for adenopathy. Does not bruise/bleed easily.  ?Psychiatric/Behavioral:  Negative for confusion.   ? ? ?Past Medical History:  ?Diagnosis Date  ? Allergic rhinitis   ? Bladder cancer ([place])   ? BPH (benign prostatic hyperplasia)   ? Chronic back pain   ? Essential hypertension   ? Glaucoma   ? Impotence of organic origin   ? Liver laceration   ? Prostate cancer ([place])   ? Stenosis, spinal, lumbar   ? ? ?Past Surgical History:  ?Procedure Laterality Date  ? BACK SURGERY    ? COLONOSCOPY    ? COLONOSCOPY WITH PROPOFOL N/A 08/24/2019  ? Procedure: COLONOSCOPY WITH PROPOFOL;  Surgeon: Lollie Sails, MD;  Location[facility] ENDOSCOPY;  Service: Endoscopy;  Laterality: N/A;  ? HERNIA REPAIR    ? NEPHRECTOMY Right 1978  ? TRANSURETHRAL RESECTION OF BLADDER TUMOR WITH GYRUS (TURBT-GYRUS)    ? ? ?Family History  ?Problem Relation Age of Onset  ? Cancer Father   ? ? ?Social History:  reports that he quit smoking about 58 years ago. His smoking use included cigarettes. He has never used smokeless tobacco. He reports that he does not drink alcohol and does not use drugs. He is a former smoker and smoked several packs per day in his early 17s but has not smoked in over 41 years. He does not drink alcohol. He is retired but used to work for [employer]. He denies known exposure to radiation or toxins. The patient is alone today.  ? ?Allergies:  ?Allergies  ?Allergen Reactions  ? Ace Inhibitors Cough  ? ? ?Current Medications: ?Current Outpatient Medications  ?Medication Sig Dispense Refill  ? amLODipine (NORVASC) 2.5 MG tablet Take 0.5 tablets (1.25 mg total) by mouth daily. 45 tablet 1  ? aspirin EC 81 MG tablet Take 81 mg by mouth daily.     ? calcium-vitamin D (OSCAL WITH D) 500-5 MG-MCG tablet Take 1 tablet by mouth daily. 90 tablet 3  ? ferrous sulfate 325 (65 FE) MG tablet Take by mouth.    ? hydrochlorothiazide (HYDRODIURIL) 25 MG tablet Take 25  mg by mouth daily.     ? latanoprost (XALATAN) 0.005 % ophthalmic solution Place 1 drop into both eyes at bedtime.     ? montelukast (SINGULAIR) 10 MG tablet Take 10 mg by mouth daily.     ? Multiple Vitamin (MULTI-VITAMIN) tablet Take 1 tablet by mouth daily.    ? vitamin B-12 (CYANOCOBALAMIN) 1000 MCG tablet Take 1,000 mcg by mouth daily.     ? abiraterone acetate (ZYTIGA) 250 MG tablet TAKE 3 TABLETS DAILY. TAKE ON AN EMPTY STOMACH 1 HOUR BEFORE OR 2 HOURS AFTER A MEAL 90 tablet 2  ? albuterol (VENTOLIN HFA) 108 (90 Base) MCG/ACT inhaler Inhale 2 puffs into the lungs every 4 (four) hours as needed for wheezing or shortness of breath. (Patient not taking: Reported on 10/03/2020)    ? fluticasone (FLONASE) 50 MCG/ACT nasal spray Place 1 spray into both nostrils daily as needed. (Patient not taking: Reported on 02/23/2022)    ? predniSONE (DELTASONE) 5 MG tablet TAKE 1 TABLET BY MOUTH EVERY DAY WITH BREAKFAST 90 tablet 1  ? ?No current facility-administered medications for this  visit.  ? ? ? ?Performance status (ECOG): 1 ? ?Vital Signs: ?Blood pressure (!) 148/67, pulse 84, temperature (!) 97.1 ?F (36.2 ?C), temperature source Tympanic, resp. rate 16, height '6\' 1"'$  (1.854 m), weight 230 lb (104.3 kg), SpO2 99 %. ?Physical Exam ?Constitutional:   ?   General: He is not in acute distress. ?   Appearance: He is not diaphoretic.  ?HENT:  ?   Head: Normocephalic and atraumatic.  ?   Nose: Nose normal.  ?   Mouth/Throat:  ?   Pharynx: No oropharyngeal exudate.  ?Eyes:  ?   General: No scleral icterus. ?   Pupils: Pupils are equal, round, and reactive to light.  ?Cardiovascular:  ?   Rate and Rhythm: Normal rate and regular rhythm.  ?   Heart sounds: No murmur heard. ?Pulmonary:  ?   Effort: Pulmonary effort is normal. No respiratory distress.  ?   Breath sounds: No rales.  ?Chest:  ?   Chest wall: No tenderness.  ?Abdominal:  ?   General: There is no distension.  ?   Palpations: Abdomen is soft.  ?   Tenderness: There is no  abdominal tenderness.  ?Musculoskeletal:     ?   General: Normal range of motion.  ?   Cervical back: Normal range of motion and neck supple.  ?Skin: ?   General: Skin is warm and dry.  ?   Findings: No erythema.  ?Neur

## 2022-03-04 ENCOUNTER — Other Ambulatory Visit (HOSPITAL_COMMUNITY): Payer: Self-pay

## 2022-03-11 ENCOUNTER — Other Ambulatory Visit: Payer: Self-pay | Admitting: Oncology

## 2022-04-01 ENCOUNTER — Inpatient Hospital Stay: Payer: Medicare Other | Attending: Oncology

## 2022-04-01 DIAGNOSIS — C61 Malignant neoplasm of prostate: Secondary | ICD-10-CM | POA: Diagnosis present

## 2022-04-01 MED ORDER — LEUPROLIDE ACETATE (6 MONTH) 45 MG ~~LOC~~ KIT
45.0000 mg | PACK | Freq: Once | SUBCUTANEOUS | Status: AC
  Administered 2022-04-01: 45 mg via SUBCUTANEOUS
  Filled 2022-04-01: qty 45

## 2022-05-12 ENCOUNTER — Other Ambulatory Visit: Payer: Self-pay | Admitting: Pharmacist

## 2022-05-12 DIAGNOSIS — C61 Malignant neoplasm of prostate: Secondary | ICD-10-CM

## 2022-06-16 ENCOUNTER — Other Ambulatory Visit: Payer: Self-pay

## 2022-06-16 MED ORDER — AMLODIPINE BESYLATE 2.5 MG PO TABS: 1.2500 mg | ORAL_TABLET | Freq: Every day | ORAL | 0 refills | Status: DC

## 2022-07-03 ENCOUNTER — Other Ambulatory Visit: Payer: Self-pay

## 2022-07-03 DIAGNOSIS — C61 Malignant neoplasm of prostate: Secondary | ICD-10-CM

## 2022-07-06 ENCOUNTER — Encounter: Payer: Self-pay | Admitting: Medical Oncology

## 2022-07-06 ENCOUNTER — Inpatient Hospital Stay: Payer: Medicare Other | Attending: Oncology

## 2022-07-06 ENCOUNTER — Inpatient Hospital Stay (HOSPITAL_BASED_OUTPATIENT_CLINIC_OR_DEPARTMENT_OTHER): Payer: Medicare Other | Admitting: Medical Oncology

## 2022-07-06 VITALS — BP 146/67 | HR 80 | Temp 98.3°F | Resp 16 | Ht 73.0 in | Wt 224.0 lb

## 2022-07-06 DIAGNOSIS — Z8546 Personal history of malignant neoplasm of prostate: Secondary | ICD-10-CM | POA: Insufficient documentation

## 2022-07-06 DIAGNOSIS — Z8551 Personal history of malignant neoplasm of bladder: Secondary | ICD-10-CM | POA: Diagnosis present

## 2022-07-06 DIAGNOSIS — Z905 Acquired absence of kidney: Secondary | ICD-10-CM | POA: Diagnosis not present

## 2022-07-06 DIAGNOSIS — Z87891 Personal history of nicotine dependence: Secondary | ICD-10-CM | POA: Diagnosis not present

## 2022-07-06 DIAGNOSIS — Z5111 Encounter for antineoplastic chemotherapy: Secondary | ICD-10-CM

## 2022-07-06 DIAGNOSIS — IMO0001 Reserved for inherently not codable concepts without codable children: Secondary | ICD-10-CM

## 2022-07-06 DIAGNOSIS — Z7982 Long term (current) use of aspirin: Secondary | ICD-10-CM | POA: Diagnosis not present

## 2022-07-06 DIAGNOSIS — C61 Malignant neoplasm of prostate: Secondary | ICD-10-CM

## 2022-07-06 DIAGNOSIS — Z79899 Other long term (current) drug therapy: Secondary | ICD-10-CM | POA: Diagnosis not present

## 2022-07-06 DIAGNOSIS — I10 Essential (primary) hypertension: Secondary | ICD-10-CM | POA: Diagnosis not present

## 2022-07-06 DIAGNOSIS — Z79818 Long term (current) use of other agents affecting estrogen receptors and estrogen levels: Secondary | ICD-10-CM | POA: Diagnosis not present

## 2022-07-06 LAB — CBC WITH DIFFERENTIAL/PLATELET
Abs Immature Granulocytes: 0.02 10*3/uL (ref 0.00–0.07)
Basophils Absolute: 0.1 10*3/uL (ref 0.0–0.1)
Basophils Relative: 1 %
Eosinophils Absolute: 0.2 10*3/uL (ref 0.0–0.5)
Eosinophils Relative: 3 %
HCT: 40.5 % (ref 39.0–52.0)
Hemoglobin: 13.1 g/dL (ref 13.0–17.0)
Immature Granulocytes: 0 %
Lymphocytes Relative: 27 %
Lymphs Abs: 2.4 10*3/uL (ref 0.7–4.0)
MCH: 32.2 pg (ref 26.0–34.0)
MCHC: 32.3 g/dL (ref 30.0–36.0)
MCV: 99.5 fL (ref 80.0–100.0)
Monocytes Absolute: 0.7 10*3/uL (ref 0.1–1.0)
Monocytes Relative: 8 %
Neutro Abs: 5.3 10*3/uL (ref 1.7–7.7)
Neutrophils Relative %: 61 %
Platelets: 177 10*3/uL (ref 150–400)
RBC: 4.07 MIL/uL — ABNORMAL LOW (ref 4.22–5.81)
RDW: 12.1 % (ref 11.5–15.5)
WBC: 8.7 10*3/uL (ref 4.0–10.5)
nRBC: 0 % (ref 0.0–0.2)

## 2022-07-06 LAB — COMPREHENSIVE METABOLIC PANEL
ALT: 21 U/L (ref 0–44)
AST: 28 U/L (ref 15–41)
Albumin: 4.1 g/dL (ref 3.5–5.0)
Alkaline Phosphatase: 42 U/L (ref 38–126)
Anion gap: 7 (ref 5–15)
BUN: 10 mg/dL (ref 8–23)
CO2: 33 mmol/L — ABNORMAL HIGH (ref 22–32)
Calcium: 9.2 mg/dL (ref 8.9–10.3)
Chloride: 99 mmol/L (ref 98–111)
Creatinine, Ser: 0.95 mg/dL (ref 0.61–1.24)
GFR, Estimated: >60 mL/min (ref >60)
Glucose, Bld: 88 mg/dL (ref 70–99)
Potassium: 4 mmol/L (ref 3.5–5.1)
Sodium: 139 mmol/L (ref 135–145)
Total Bilirubin: 1.1 mg/dL (ref 0.3–1.2)
Total Protein: 6.8 g/dL (ref 6.5–8.1)

## 2022-07-06 LAB — PSA: Prostatic Specific Antigen: 0.01 ng/mL (ref 0.00–4.00)

## 2022-07-06 NOTE — Progress Notes (Signed)
Hematology Oncology Progress Note   Clinic Day:  07/06/2022   Referring physician: Sallee Lange, *  Chief Complaint: James Lucas is a 81 y.o. male with a long-standing history of prostate cancer who presents for follow up.   PERTINENT ONCOLOGY HISTORY Patient previously followed up by Dr.Corcoran, patient switched care to me on 07/03/21 Extensive medical record review was performed by me Per note,  # 2009 prostate cancer .  Gleason score was 4+3 in 2009.  He received cryoablation. # 2010 PSA rose to 1.4, biochemical recurrence.  Prostate biopsy in 04/2009 revealed Gleason 5+4 adenocarcinoma.  He received IMRT with adjuvant androgen deprivation therapy.  PSA nadir was < 0.1.  05/23/2014  Prostate MRI revealed areas suspicious for extracapsular extension in the right posterior apex and along the posterior aspect of the anterior urethra.  ADT was discussed but due to history of side effects he elected to continue surveillance.  #2016 PSA increased and he was started on Lupron, intermittently secondary to side effects.  # 01/2016 PSA increased to 3.64 05/01/2016 CT revealed no evidence of recurrent or metastatic disease. Bone scan on 05/01/2016 showed no evidence of osseous metastatic disease # 07/10/2016 Seen by Dr.Finnegan, started on Leupron.  # 10/26/2016 Dr. Beryle Lathe at [facility] recommended discontinuation of Lupron as his PSA was < 4.0. He was to pursue intermittent therapy when PSA increased to about 10.  Testosterone and PSA checks were recommended every 2 months and PET/CT scan with F-18 fluciclovine to assess for oligomenorrhea static disease if PSA began to rise.  12/02/2017 Axumin PET scan revealed potential local recurrence in the residual left prostate gland, with the region of activity relatively small at approximately 1.5 cm. No evidence of metastatic disease in the abdomen pelvis or skeleton 09/02/2018 by Dr. Grayland Ormond.  At that time, PSA was 5.98.  He agreed to  reinitiate Lupron (received 09/12/2018).  07/02/2020 PET Axumin scan on  revealed intense radiotracer activity in the LEFT prostate gland concern for residual carcinoma.  There was no significant change from PET-CT 09/09/2018.  There was no nodal metastasis or skeletal metastasis.  He is s/p RIGHT nephrectomy.  08/22/2020 revealed prostatic adenocarcinoma in 9/12 (six from the left side; 3 from the right side) biopsies.  The tumor showed mixed morphology with some areas showing ADT treatment effect and other areas without significant treatment effect.  Pattern was Gleason 4 and 5 in areas of less treatment effect.  The background prostate was fibrotic c/w radiation effect.   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  He undergoes surveillance cystoscopy by Dr. Alto Denver [facility]).   Last cystoscopy was normal on 01/12/2019.  on Eligard '45mg'$  Q6 months. Last received on 04/02/2021 07/10/2021, patient started on Abiateron 750 mg daily and prednisone 5 mg daily.  Interval History:  INTERVAL HISTORY James Lucas is a 81 y.o. male who has above history reviewed by me today presents for follow up visit for management of prostate cancer He reports that he is doing well today. No concerns. He is actively trying to lose weight. Down about 4 pounds since last visit.  He is on Abiaterone and prednisone which he reports that he tolerates well without GI discomfort. IN the past has had some mild BP elevations however this has been a bit better controlled recently.    Review of Systems  Constitutional:  Positive for fatigue. Negative for appetite change, chills, fever and unexpected weight  change.  HENT:   Negative for hearing loss and voice change.   Eyes:  Negative for eye problems and icterus.  Respiratory:  Negative for chest tightness, cough and shortness of breath.   Cardiovascular:  Negative for chest pain and leg swelling.   Gastrointestinal:  Negative for abdominal distention and abdominal pain.  Endocrine: Positive for hot flashes.  Genitourinary:  Negative for difficulty urinating, dysuria and frequency.   Skin:  Negative for itching and rash.  Neurological:  Negative for light-headedness and numbness.  Hematological:  Negative for adenopathy. Does not bruise/bleed easily.  Psychiatric/Behavioral:  Negative for confusion.      Past Medical History:  Diagnosis Date   Allergic rhinitis    Bladder cancer ([facility])    BPH (benign prostatic hyperplasia)    Chronic back pain    Essential hypertension    Glaucoma    Impotence of organic origin    Liver laceration    Prostate cancer ([place])    Stenosis, spinal, lumbar     Past Surgical History:  Procedure Laterality Date   BACK SURGERY     COLONOSCOPY     COLONOSCOPY WITH PROPOFOL N/A 08/24/2019   Procedure: COLONOSCOPY WITH PROPOFOL;  Surgeon: Lollie Sails, MD;  Location: [facility] ENDOSCOPY;  Service: Endoscopy;  Laterality: N/A;   HERNIA REPAIR     NEPHRECTOMY Right 1978   TRANSURETHRAL RESECTION OF BLADDER TUMOR WITH GYRUS (TURBT-GYRUS)      Family History  Problem Relation Age of Onset   Cancer Father     Social History:  reports that he quit smoking about 58 years ago. His smoking use included cigarettes. He has never used smokeless tobacco. He reports that he does not drink alcohol and does not use drugs. He is a former smoker and smoked several packs per day in his early 20s but has not smoked in over 75 years. He does not drink alcohol. He is retired but used to work for [employer]. He denies known exposure to radiation or toxins. The patient is alone today.   Allergies:  Allergies  Allergen Reactions   Ace Inhibitors Cough    Current Medications: Current Outpatient Medications  Medication Sig Dispense Refill   abiraterone acetate (ZYTIGA) 250 MG tablet TAKE 3 TABLETS DAILY. TAKE ON AN EMPTY STOMACH 1 HOUR BEFORE OR 2 HOURS AFTER A MEAL 90  tablet 2   albuterol (VENTOLIN HFA) 108 (90 Base) MCG/ACT inhaler Inhale 2 puffs into the lungs every 4 (four) hours as needed for wheezing or shortness of breath.     amLODipine (NORVASC) 2.5 MG tablet Take 0.5 tablets (1.25 mg total) by mouth daily. 90 tablet 0   aspirin EC 81 MG tablet Take 81 mg by mouth daily.      calcium-vitamin D (OSCAL WITH D) 500-5 MG-MCG tablet Take 1 tablet by mouth daily. 90 tablet 3   ferrous sulfate 325 (65 FE) MG tablet Take by mouth.     fluticasone (FLONASE) 50 MCG/ACT nasal spray Place 1 spray into both nostrils daily as needed.     hydrochlorothiazide (HYDRODIURIL) 25 MG tablet Take 25 mg by mouth daily.      latanoprost (XALATAN) 0.005 % ophthalmic solution Place 1 drop into both eyes at bedtime.      montelukast (SINGULAIR) 10 MG tablet Take 10 mg by mouth daily.      Multiple Vitamin (MULTI-VITAMIN) tablet Take 1 tablet by mouth daily.     predniSONE (DELTASONE) 5 MG tablet TAKE 1 TABLET BY  MOUTH EVERY DAY WITH BREAKFAST 90 tablet 1   vitamin B-12 (CYANOCOBALAMIN) 1000 MCG tablet Take 1,000 mcg by mouth daily.      No current facility-administered medications for this visit.     Performance status (ECOG): 1  Vital Signs: Blood pressure (!) 146/67, pulse 80, temperature 98.3 F (36.8 C), temperature source Tympanic, resp. rate 16, height '6\' 1"'$  (1.854 m), weight 224 lb (101.6 kg), SpO2 100 %. Physical Exam Constitutional:      General: He is not in acute distress.    Appearance: He is not diaphoretic.  HENT:     Head: Normocephalic and atraumatic.     Nose: Nose normal.     Mouth/Throat:     Pharynx: No oropharyngeal exudate.  Eyes:     General: No scleral icterus.    Pupils: Pupils are equal, round, and reactive to light.  Cardiovascular:     Rate and Rhythm: Normal rate and regular rhythm.     Heart sounds: No murmur heard. Pulmonary:     Effort: Pulmonary effort is normal. No respiratory distress.     Breath sounds: No rales.  Chest:      Chest wall: No tenderness.  Abdominal:     General: There is no distension.     Palpations: Abdomen is soft.     Tenderness: There is no abdominal tenderness.  Musculoskeletal:        General: Normal range of motion.     Cervical back: Normal range of motion and neck supple.  Skin:    General: Skin is warm and dry.     Findings: No erythema.  Neurological:     Mental Status: He is alert and oriented to person, place, and time.     Cranial Nerves: No cranial nerve deficit.     Motor: No abnormal muscle tone.     Coordination: Coordination normal.  Psychiatric:        Mood and Affect: Affect normal.      Appointment on 07/06/2022  Component Date Value Ref Range Status   Sodium 07/06/2022 139  135 - 145 mmol/L Final   Potassium 07/06/2022 4.0  3.5 - 5.1 mmol/L Final   Chloride 07/06/2022 99  98 - 111 mmol/L Final   CO2 07/06/2022 33 (H)  22 - 32 mmol/L Final   Glucose, Bld 07/06/2022 88  70 - 99 mg/dL Final   Glucose reference range applies only to samples taken after fasting for at least 8 hours.   BUN 07/06/2022 10  8 - 23 mg/dL Final   Creatinine, Ser 07/06/2022 0.95  0.61 - 1.24 mg/dL Final   Calcium 07/06/2022 9.2  8.9 - 10.3 mg/dL Final   Total Protein 07/06/2022 6.8  6.5 - 8.1 g/dL Final   Albumin 07/06/2022 4.1  3.5 - 5.0 g/dL Final   AST 07/06/2022 28  15 - 41 U/L Final   ALT 07/06/2022 21  0 - 44 U/L Final   Alkaline Phosphatase 07/06/2022 42  38 - 126 U/L Final   Total Bilirubin 07/06/2022 1.1  0.3 - 1.2 mg/dL Final   GFR, Estimated 07/06/2022 >60  >60 mL/min Final   Comment: (NOTE) Calculated using the CKD-EPI Creatinine Equation (2021)    Anion gap 07/06/2022 7  5 - 15 Final   Performed at [facility], [place], [place] [address]   WBC 07/06/2022 8.7  4.0 - 10.5 K/uL Final   RBC 07/06/2022 4.07 (L)  4.22 - 5.81 MIL/uL Final  Hemoglobin 07/06/2022 13.1  13.0 - 17.0 g/dL Final   HCT 07/06/2022 40.5  39.0 - 52.0 % Final   MCV  07/06/2022 99.5  80.0 - 100.0 fL Final   MCH 07/06/2022 32.2  26.0 - 34.0 pg Final   MCHC 07/06/2022 32.3  30.0 - 36.0 g/dL Final   RDW 07/06/2022 12.1  11.5 - 15.5 % Final   Platelets 07/06/2022 177  150 - 400 K/uL Final   nRBC 07/06/2022 0.0  0.0 - 0.2 % Final   Neutrophils Relative % 07/06/2022 61  % Final   Neutro Abs 07/06/2022 5.3  1.7 - 7.7 K/uL Final   Lymphocytes Relative 07/06/2022 27  % Final   Lymphs Abs 07/06/2022 2.4  0.7 - 4.0 K/uL Final   Monocytes Relative 07/06/2022 8  % Final   Monocytes Absolute 07/06/2022 0.7  0.1 - 1.0 K/uL Final   Eosinophils Relative 07/06/2022 3  % Final   Eosinophils Absolute 07/06/2022 0.2  0.0 - 0.5 K/uL Final   Basophils Relative 07/06/2022 1  % Final   Basophils Absolute 07/06/2022 0.1  0.0 - 0.1 K/uL Final   Immature Granulocytes 07/06/2022 0  % Final   Abs Immature Granulocytes 07/06/2022 0.02  0.00 - 0.07 K/uL Final   Performed at [facility], [address], [place], [place] [address]    Assessment and Plan.   No diagnosis found.   # Recurrent prostate Cancer- diagnosed 2009 s/p cryotherapy and biochemical recurrence in 2010, s/p IMRT, 08/2020 biopsy proven persistent local recurrence, not a candidate for additional RT. Castration sensitive local recurrence.  Currently receiving Eligard every 6 months - last received in Nov 2022 - next due in May 2023.  Continue Abiateron 750 mg plus prednisone 5 mg daily.  Recommend patient to report symptoms of stomach discomfort and may consider PPI or H2 blocker for GI prophylaxis. Today: Chronic and stable. Tolerating his medications well. Continue current treatment.   #Hypertension, currently on hydrochlorothiazide 25 mg daily.   Norvasc 2.5 mg if his SBP is persistently above 150 he may utilize low-dose Norvasc for BP control. Today: Chronic and improved from previous but slightly above target- Continue his current medication regimen, Continue weight loss efforts through low salt diet  and increased exercise which should also help with his weight loss efforts.   Follow up  Nov 2023 Eligard.  Feb, 2024  MD, lab, CBC CMP PSA   I discussed the assessment and treatment plan with the patient.  The patient was provided an opportunity to ask questions and all were answered.    [facility]   07/06/2022

## 2022-07-07 ENCOUNTER — Telehealth: Payer: Self-pay | Admitting: Pharmacist

## 2022-07-09 ENCOUNTER — Other Ambulatory Visit (HOSPITAL_COMMUNITY): Payer: Self-pay

## 2022-07-09 NOTE — Telephone Encounter (Signed)
Oral Oncology Pharmacist Encounter   Prior Authorization for abiraterone has been approved.     PA# [id number] Effective dates: 07/09/22 through 07/09/23   Oral Oncology Clinic will continue to follow.   Darl Pikes, PharmD, BCPS. BCOP Hematology/Oncology Clinical Pharmacist [facility] Oral Chemotherapy Navigation Clinic [phone]  07/09/2022 12:03 PM

## 2022-07-28 ENCOUNTER — Other Ambulatory Visit: Payer: Self-pay | Admitting: Pharmacist

## 2022-07-28 DIAGNOSIS — C61 Malignant neoplasm of prostate: Secondary | ICD-10-CM

## 2022-08-17 ENCOUNTER — Encounter: Payer: Self-pay | Admitting: Emergency Medicine

## 2022-08-17 ENCOUNTER — Ambulatory Visit
Admission: EM | Admit: 2022-08-17 | Discharge: 2022-08-17 | Disposition: A | Discharge status: Home / Self Care | Payer: Medicare Other | Attending: Nurse Practitioner | Admitting: Nurse Practitioner

## 2022-08-17 DIAGNOSIS — S61011A Laceration without foreign body of right thumb without damage to nail, initial encounter: Secondary | ICD-10-CM | POA: Diagnosis not present

## 2022-08-17 MED ORDER — DOXYCYCLINE HYCLATE 100 MG PO CAPS
100.0000 mg | ORAL_CAPSULE | Freq: Two times a day (BID) | ORAL | 0 refills | Status: AC
Start: 2022-08-17 — End: 2022-08-22

## 2022-08-17 NOTE — ED Triage Notes (Addendum)
Pt has laceration on his right thumb. He states he was using a table saw and unsure if the table saw or the wood hit his finger. He states it occurred about 20 minutes ago. Last td was 12/19/2013

## 2022-08-17 NOTE — Discharge Instructions (Addendum)
You have laceration(gash) to your right thumb. The laceration do snot requir sutures. You have a steri strip in place. As the laceration heals the steri strip will pill itself off. You can trim the edges.  Do not submerge in water for 24 hours. If you notice ay increased redness, swelling or drainage from the site please follow up here for further evaluation. Doxycyline 100 mg twice daily

## 2022-08-17 NOTE — ED Provider Notes (Signed)
[facility]    CSN: [id number] Arrival date & time: 08/17/22  1359      History   Chief Complaint Chief Complaint  Patient presents with   Laceration    HPI James Lucas is a 81 y.o. male.   HPI  He is in today for injury to his right thumb. He reports that was in his shop making cabinets when he injured his thumb. He is having some pain different movements. He endorses bleeding. He did have chip in his nail. He has full range of motion.    Past Medical History:  Diagnosis Date   Allergic rhinitis    Bladder cancer (HCC)    BPH (benign prostatic hyperplasia)    Chronic back pain    Essential hypertension    Glaucoma    Impotence of organic origin    Liver laceration    Prostate cancer ([place])    Stenosis, spinal, lumbar     Patient Active Problem List   Diagnosis Date Noted   Glaucoma (increased eye pressure) 07/24/2021   Goals of care, counseling/discussion 07/24/2021   Iron deficiency anemia 01/02/2021   B12 deficiency 02/20/2020   Normocytic anemia 02/20/2020   Nocturia 09/13/2018   Chronic idiopathic constipation 01/21/2017   Prostate cancer ([place]) 07/09/2016   High risk medication use 07/24/2015   Chronic back pain 07/12/2014   Allergic rhinitis, unspecified 07/11/2014   Essential hypertension 07/11/2014   Benign prostatic hyperplasia 07/11/2014   Hyperlipidemia 06/20/2014   ED (erectile dysfunction) of organic origin 09/26/2012   History of bladder cancer 09/26/2012   Displacement of lumbar intervertebral disc 08/03/2012   History of adenomatous polyp of colon 02/14/2005    Past Surgical History:  Procedure Laterality Date   BACK SURGERY     COLONOSCOPY     COLONOSCOPY WITH PROPOFOL N/A 08/24/2019   Procedure: COLONOSCOPY WITH PROPOFOL;  Surgeon: Lollie Sails, MD;  Location: [facility] ENDOSCOPY;  Service: Endoscopy;  Laterality: N/A;   HERNIA REPAIR     NEPHRECTOMY Right 1978   TRANSURETHRAL RESECTION OF BLADDER TUMOR WITH GYRUS  (TURBT-GYRUS)         Home Medications    Prior to Admission medications   Medication Sig Start Date End Date Taking? Authorizing Provider  abiraterone acetate (ZYTIGA) 250 MG tablet TAKE 3 TABLETS DAILY. TAKE ON AN EMPTY STOMACH 1 HOUR BEFORE OR 2 HOURS AFTER A MEAL 07/28/22  Yes Darl Pikes, RPH-CPP  amLODipine (NORVASC) 2.5 MG tablet Take 0.5 tablets (1.25 mg total) by mouth daily. 06/16/22  Yes Earlie Server, MD  aspirin EC 81 MG tablet Take 81 mg by mouth daily.    Yes [provider]  calcium-vitamin D (OSCAL WITH D) 500-5 MG-MCG tablet Take 1 tablet by mouth daily. 02/23/22  Yes Earlie Server, MD  doxycycline (VIBRAMYCIN) 100 MG capsule Take 1 capsule (100 mg total) by mouth 2 (two) times daily for 5 days. 08/17/22 08/22/22 Yes Vevelyn Francois, NP  ferrous sulfate 325 (65 FE) MG tablet Take by mouth.   Yes [provider]  fluticasone (FLONASE) 50 MCG/ACT nasal spray Place 1 spray into both nostrils daily as needed. 01/21/16  Yes [provider]  hydrochlorothiazide (HYDRODIURIL) 25 MG tablet Take 25 mg by mouth daily.  07/24/15  Yes [provider]  latanoprost (XALATAN) 0.005 % ophthalmic solution Place 1 drop into both eyes at bedtime.  03/07/19  Yes [provider]  montelukast (SINGULAIR) 10 MG tablet Take 10 mg by mouth daily.  01/21/16  Yes [provider]  Multiple Vitamin (MULTI-VITAMIN) tablet Take 1 tablet by mouth daily.   Yes [provider]  vitamin B-12 (CYANOCOBALAMIN) 1000 MCG tablet Take 1,000 mcg by mouth daily.    Yes [provider]  albuterol (VENTOLIN HFA) 108 (90 Base) MCG/ACT inhaler Inhale 2 puffs into the lungs every 4 (four) hours as needed for wheezing or shortness of breath.    [provider]  predniSONE (DELTASONE) 5 MG tablet TAKE 1 TABLET BY MOUTH EVERY DAY WITH BREAKFAST 03/11/22   Earlie Server, MD    Family History Family History  Problem Relation Age of Onset   Cancer Father      Social History Social History   Tobacco Use   Smoking status: Former    Types: Cigarettes    Quit date: 1965    Years since quitting: 58.7   Smokeless tobacco: Never  Vaping Use   Vaping Use: Never used  Substance Use Topics   Alcohol use: No   Drug use: No     Allergies   Ace inhibitors   Review of Systems Review of Systems   Physical Exam Triage Vital Signs ED Triage Vitals  Enc Vitals Group     BP 08/17/22 1428 (!) 154/75     Pulse Rate 08/17/22 1428 88     Resp 08/17/22 1428 16     Temp 08/17/22 1428 98.5 F (36.9 C)     Temp Source 08/17/22 1428 Oral     SpO2 08/17/22 1428 99 %     Weight 08/17/22 1426 223 lb 15.8 oz (101.6 kg)     Height 08/17/22 1426 '6\' 1"'$  (1.854 m)     Head Circumference --      Peak Flow --      Pain Score 08/17/22 1425 2     Pain Loc --      Pain Edu? --      Excl. in [place]? --    No data found.  Updated Vital Signs BP (!) 154/75 (BP Location: Left Arm)   Pulse 88   Temp 98.5 F (36.9 C) (Oral)   Resp 16   Ht '6\' 1"'$  (1.854 m)   Wt 223 lb 15.8 oz (101.6 kg)   SpO2 99%   BMI 29.55 kg/m   Visual Acuity Right Eye Distance:   Left Eye Distance:   Bilateral Distance:    Right Eye Near:   Left Eye Near:    Bilateral Near:     Physical Exam Constitutional:      Appearance: He is obese. He is not ill-appearing, toxic-appearing or diaphoretic.  HENT:     Head: Normocephalic and atraumatic.  Cardiovascular:     Rate and Rhythm: Normal rate.  Pulmonary:     Effort: Pulmonary effort is normal.  Musculoskeletal:     Right hand: Tenderness present. No lacerations or bony tenderness. Normal range of motion. Normal pulse.       Hands:     Cervical back: Normal range of motion.     Comments: Slight gash to thumb around  tip of nailbed  Skin:    General: Skin is warm.     Capillary Refill: Capillary refill takes less than 2 seconds.  Neurological:     General: No focal deficit present.     Mental Status: He is alert and  oriented to person, place, and time.  Psychiatric:        Mood and Affect: Mood normal.  Behavior: Behavior normal.        Thought Content: Thought content normal.        Judgment: Judgment normal.      UC Treatments / Results  Labs (all labs ordered are listed, but only abnormal results are displayed) Labs Reviewed - No data to display  EKG   Radiology No results found.  Procedures Procedures (including critical care time)  Medications Ordered in UC Medications - No data to display  Initial Impression / Assessment and Plan / UC Course  I have reviewed the triage vital signs and the nursing notes.  Pertinent labs & imaging results that were available during my care of the patient were reviewed by me and considered in my medical decision making (see chart for details).   Injury to thumb  Final Clinical Impressions(s) / UC Diagnoses   Final diagnoses:  Laceration of right thumb without foreign body without damage to nail, initial encounter     Discharge Instructions      You have laceration(gash) to your right thumb. The laceration do snot requir sutures. You have a steri strip in place. As the laceration heals the steri strip will pill itself off. You can trim the edges.  Do not submerge in water for 24 hours. If you notice ay increased redness, swelling or drainage from the site please follow up here for further evaluation. Doxycyline 100 mg twice daily      ED Prescriptions     Medication Sig Dispense Auth. Provider   doxycycline (VIBRAMYCIN) 100 MG capsule Take 1 capsule (100 mg total) by mouth 2 (two) times daily for 5 days. 10 capsule Vevelyn Francois, NP      PDMP not reviewed this encounter.   Dionisio David [place], [place] 08/17/22 [phone]

## 2022-09-02 ENCOUNTER — Other Ambulatory Visit: Payer: Self-pay | Admitting: Oncology

## 2022-09-04 ENCOUNTER — Encounter: Payer: Self-pay | Admitting: Oncology

## 2022-10-06 ENCOUNTER — Inpatient Hospital Stay: Payer: Medicare Other | Attending: Oncology

## 2022-10-06 DIAGNOSIS — C61 Malignant neoplasm of prostate: Secondary | ICD-10-CM | POA: Insufficient documentation

## 2022-10-06 MED ORDER — LEUPROLIDE ACETATE (6 MONTH) 45 MG ~~LOC~~ KIT
45.0000 mg | PACK | Freq: Once | SUBCUTANEOUS | Status: AC
  Administered 2022-10-06: 45 mg via SUBCUTANEOUS
  Filled 2022-10-06: qty 45

## 2022-10-27 ENCOUNTER — Telehealth: Payer: Self-pay

## 2022-10-27 ENCOUNTER — Telehealth: Payer: Self-pay | Admitting: Oncology

## 2022-10-27 ENCOUNTER — Other Ambulatory Visit: Payer: Self-pay | Admitting: Pharmacist

## 2022-10-27 ENCOUNTER — Other Ambulatory Visit: Payer: Self-pay

## 2022-10-27 DIAGNOSIS — C61 Malignant neoplasm of prostate: Secondary | ICD-10-CM

## 2022-10-27 MED ORDER — ABIRATERONE ACETATE 250 MG PO TABS: ORAL_TABLET | ORAL | 0 refills | Status: DC

## 2022-10-27 NOTE — Telephone Encounter (Signed)
Prescription sent to [facility] already

## 2022-10-27 NOTE — Telephone Encounter (Signed)
Req via [place], MD to have pt scheduled pt has been scheduked for tomorrow, LVM with detailed information in regards to appt

## 2022-10-27 NOTE — Telephone Encounter (Signed)
Oral Oncology Pharmacist Encounter  Received refill request from [facility] for a new prescription for Zytiga. Per MD, okay to refill prescription for zytiga on 10/27/22. Patient is scheduled for MD visit and labs on  10/28/22.   Drema Halon, PharmD Hematology/Oncology Clinical Pharmacist [facility] [phone]

## 2022-10-28 ENCOUNTER — Inpatient Hospital Stay: Payer: Medicare Other | Admitting: Oncology

## 2022-10-28 ENCOUNTER — Inpatient Hospital Stay: Payer: Medicare Other | Attending: Oncology

## 2022-11-17 ENCOUNTER — Encounter: Payer: Self-pay | Admitting: Oncology

## 2022-11-17 ENCOUNTER — Inpatient Hospital Stay (HOSPITAL_BASED_OUTPATIENT_CLINIC_OR_DEPARTMENT_OTHER): Payer: Medicare Other | Admitting: Oncology

## 2022-11-17 ENCOUNTER — Other Ambulatory Visit: Payer: Self-pay | Admitting: Pharmacist

## 2022-11-17 ENCOUNTER — Other Ambulatory Visit (HOSPITAL_COMMUNITY): Payer: Self-pay

## 2022-11-17 ENCOUNTER — Inpatient Hospital Stay: Payer: Medicare Other | Attending: Oncology

## 2022-11-17 VITALS — BP 149/77 | HR 87 | Temp 99.6°F | Resp 18 | Wt 232.0 lb

## 2022-11-17 DIAGNOSIS — Z7982 Long term (current) use of aspirin: Secondary | ICD-10-CM | POA: Insufficient documentation

## 2022-11-17 DIAGNOSIS — Z79899 Other long term (current) drug therapy: Secondary | ICD-10-CM | POA: Insufficient documentation

## 2022-11-17 DIAGNOSIS — D649 Anemia, unspecified: Secondary | ICD-10-CM

## 2022-11-17 DIAGNOSIS — I10 Essential (primary) hypertension: Secondary | ICD-10-CM | POA: Diagnosis not present

## 2022-11-17 DIAGNOSIS — Z8551 Personal history of malignant neoplasm of bladder: Secondary | ICD-10-CM | POA: Insufficient documentation

## 2022-11-17 DIAGNOSIS — Z7952 Long term (current) use of systemic steroids: Secondary | ICD-10-CM | POA: Diagnosis not present

## 2022-11-17 DIAGNOSIS — C61 Malignant neoplasm of prostate: Secondary | ICD-10-CM

## 2022-11-17 DIAGNOSIS — Z87891 Personal history of nicotine dependence: Secondary | ICD-10-CM | POA: Insufficient documentation

## 2022-11-17 LAB — COMPREHENSIVE METABOLIC PANEL
ALT: 15 U/L (ref 0–44)
AST: 23 U/L (ref 15–41)
Albumin: 4 g/dL (ref 3.5–5.0)
Alkaline Phosphatase: 53 U/L (ref 38–126)
Anion gap: 10 (ref 5–15)
BUN: 13 mg/dL (ref 8–23)
CO2: 31 mmol/L (ref 22–32)
Calcium: 9.1 mg/dL (ref 8.9–10.3)
Chloride: 101 mmol/L (ref 98–111)
Creatinine, Ser: 0.95 mg/dL (ref 0.61–1.24)
GFR, Estimated: >60 mL/min (ref >60)
Glucose, Bld: 135 mg/dL — ABNORMAL HIGH (ref 70–99)
Potassium: 4.1 mmol/L (ref 3.5–5.1)
Sodium: 142 mmol/L (ref 135–145)
Total Bilirubin: 0.8 mg/dL (ref 0.3–1.2)
Total Protein: 6.9 g/dL (ref 6.5–8.1)

## 2022-11-17 LAB — CBC WITH DIFFERENTIAL/PLATELET
Abs Immature Granulocytes: 0.03 10*3/uL (ref 0.00–0.07)
Basophils Absolute: 0.1 10*3/uL (ref 0.0–0.1)
Basophils Relative: 1 %
Eosinophils Absolute: 0.3 10*3/uL (ref 0.0–0.5)
Eosinophils Relative: 3 %
HCT: 38.7 % — ABNORMAL LOW (ref 39.0–52.0)
Hemoglobin: 12.9 g/dL — ABNORMAL LOW (ref 13.0–17.0)
Immature Granulocytes: 0 %
Lymphocytes Relative: 17 %
Lymphs Abs: 1.7 10*3/uL (ref 0.7–4.0)
MCH: 32.4 pg (ref 26.0–34.0)
MCHC: 33.3 g/dL (ref 30.0–36.0)
MCV: 97.2 fL (ref 80.0–100.0)
Monocytes Absolute: 0.6 10*3/uL (ref 0.1–1.0)
Monocytes Relative: 5 %
Neutro Abs: 7.7 10*3/uL (ref 1.7–7.7)
Neutrophils Relative %: 74 %
Platelets: 203 10*3/uL (ref 150–400)
RBC: 3.98 MIL/uL — ABNORMAL LOW (ref 4.22–5.81)
RDW: 12.3 % (ref 11.5–15.5)
WBC: 10.3 10*3/uL (ref 4.0–10.5)
nRBC: 0 % (ref 0.0–0.2)

## 2022-11-17 LAB — PSA: Prostatic Specific Antigen: 0.01 ng/mL (ref 0.00–4.00)

## 2022-11-17 MED ORDER — ABIRATERONE ACETATE 250 MG PO TABS
ORAL_TABLET | ORAL | 0 refills | Status: DC
  Filled 2022-11-17: qty 90, fill #0
  Filled 2022-11-24: qty 90, 30d supply, fill #0

## 2022-11-17 NOTE — Assessment & Plan Note (Addendum)
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 Castration sensitive local recurrence.  Currently receiving Eligard every 6 months - last received in Nov 2023.  Continue Abiateron 750 mg plus prednisone 5 mg daily.  PSA 0.01, stable.  Recommend patient to report symptoms of stomach discomfort and may consider PPI or H2 blocker for GI prophylaxis. 

## 2022-11-17 NOTE — Assessment & Plan Note (Signed)
Stable  monitor

## 2022-11-17 NOTE — Progress Notes (Signed)
Hematology/Oncology Progress note [phone] [phone]      Chief Complaint: James Lucas is a 82 y.o. male with a long-standing history of prostate cancer who presents for follow up.   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 Castration sensitive local recurrence.  Currently receiving Eligard every 6 months - last received in Nov 2023.  Continue Abiateron 750 mg plus prednisone 5 mg daily.  PSA 0.01, stable.  Recommend patient to report symptoms of stomach discomfort and may consider PPI or H2 blocker for GI prophylaxis.  Normocytic anemia Stable. monitor  Essential hypertension currently on hydrochlorothiazide 25 mg daily.   Norvasc 2.5 mg if his SBP is persistently above 150 he may utilize low-dose Norvasc for BP control.  BP is stable.  Orders Placed This Encounter  Procedures   CBC with Differential/Platelet    Standing Status:   Future    Standing Expiration Date:   11/18/2023   Comprehensive metabolic panel    Standing Status:   Future    Standing Expiration Date:   11/17/2023   PSA    Standing Status:   Future    Standing Expiration Date:   11/18/2023   Follow up in 3 months.  All questions were answered. The patient knows to call the clinic with any problems, questions or concerns.  Earlie Server, MD, PhD [facility] Hematology Oncology 11/17/2022   PERTINENT ONCOLOGY HISTORY Patient previously followed up by Dr.Corcoran, patient switched care to me on 07/03/21 Extensive medical record review was performed by me Per note,  # 2009 prostate cancer .  Gleason score was 4+3 in 2009.  He received cryoablation. # 2010 PSA rose to 1.4, biochemical recurrence.  Prostate biopsy in 04/2009 revealed Gleason 5+4 adenocarcinoma.  He received IMRT with adjuvant  androgen deprivation therapy.  PSA nadir was < 0.1.  05/23/2014  Prostate MRI revealed areas suspicious for extracapsular extension in the right posterior apex and along the posterior aspect of the anterior urethra.  ADT was discussed but due to history of side effects he elected to continue surveillance.  #2016 PSA increased and he was started on Lupron, intermittently secondary to side effects.  # 01/2016 PSA increased to 3.64 05/01/2016 CT revealed no evidence of recurrent or metastatic disease. Bone scan on 05/01/2016 showed no evidence of osseous metastatic disease # 07/10/2016 Seen by Dr.Finnegan, started on Leupron.  # 10/26/2016 Dr. Beryle Lathe at [facility] recommended discontinuation of Lupron as his PSA was < 4.0. He was to pursue intermittent therapy when PSA increased to about 10.  Testosterone and PSA checks were recommended every 2 months and PET/CT scan with F-18 fluciclovine to assess for oligomenorrhea static disease if PSA began to rise.  12/02/2017 Axumin PET scan revealed potential local recurrence in the residual left prostate gland, with the region of activity relatively small at approximately 1.5 cm. No evidence of metastatic disease in the abdomen pelvis or skeleton 09/02/2018 by Dr. Grayland Ormond.  At that time, PSA was 5.98.  He agreed to reinitiate Lupron (received 09/12/2018).  07/02/2020 PET Axumin scan on  revealed intense radiotracer activity in the LEFT prostate gland concern for residual carcinoma.  There was no significant change from PET-CT 09/09/2018.  There was no nodal metastasis or skeletal metastasis.  He is s/p RIGHT nephrectomy.  08/22/2020 revealed prostatic adenocarcinoma in 9/12 (six from  the left side; 3 from the right side) biopsies.  The tumor showed mixed morphology with some areas showing ADT treatment effect and other areas without significant treatment effect.  Pattern was Gleason 4 and 5 in areas of less treatment effect.  The background prostate was fibrotic  c/w radiation effect.   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  He undergoes surveillance cystoscopy by Dr. [facility]).   Last cystoscopy was normal on 01/12/2019.  on Eligard '45mg'$  Q6 months. Last received on 04/02/2021 07/10/2021, patient started on Abiateron 750 mg daily and prednisone 5 mg daily.  Interval History:  INTERVAL HISTORY James Lucas is a 82 y.o. male who has above history reviewed by me today presents for follow up visit for management of prostate cancer He missed his appt recently, and rescheduled to today.  He is on Abiaterone and prednisone, tolerates well.  Blood pressure is well controlled.    Review of Systems  Constitutional:  Positive for fatigue. Negative for appetite change, chills, fever and unexpected weight change.  HENT:   Negative for hearing loss and voice change.   Eyes:  Negative for eye problems and icterus.  Respiratory:  Negative for chest tightness, cough and shortness of breath.   Cardiovascular:  Negative for chest pain and leg swelling.  Gastrointestinal:  Negative for abdominal distention and abdominal pain.  Endocrine: Positive for hot flashes.  Genitourinary:  Negative for difficulty urinating, dysuria and frequency.   Skin:  Negative for itching and rash.  Neurological:  Negative for light-headedness and numbness.  Hematological:  Negative for adenopathy. Does not bruise/bleed easily.  Psychiatric/Behavioral:  Negative for confusion.      Past Medical History:  Diagnosis Date   Allergic rhinitis    Bladder cancer (HCC)    BPH (benign prostatic hyperplasia)    Chronic back pain    Essential hypertension    Glaucoma    Impotence of organic origin    Liver laceration    Prostate cancer ([place])    Stenosis, spinal, lumbar     Past Surgical History:  Procedure Laterality Date   BACK SURGERY     COLONOSCOPY     COLONOSCOPY WITH PROPOFOL N/A  08/24/2019   Procedure: COLONOSCOPY WITH PROPOFOL;  Surgeon: Lollie Sails, MD;  Location: [facility] ENDOSCOPY;  Service: Endoscopy;  Laterality: N/A;   HERNIA REPAIR     NEPHRECTOMY Right 1978   TRANSURETHRAL RESECTION OF BLADDER TUMOR WITH GYRUS (TURBT-GYRUS)      Family History  Problem Relation Age of Onset   Cancer Father     Social History:  reports that he quit smoking about 59 years ago. His smoking use included cigarettes. He has never used smokeless tobacco. He reports that he does not drink alcohol and does not use drugs. He is a former smoker and smoked several packs per day in his early 82s but has not smoked in over 98 years. He does not drink alcohol. He is retired but used to work for [employer]. He denies known exposure to radiation or toxins. The patient is alone today.   Allergies:  Allergies  Allergen Reactions   Ace Inhibitors Cough    Current Medications: Current Outpatient Medications  Medication Sig Dispense Refill   albuterol (VENTOLIN HFA) 108 (90 Base) MCG/ACT inhaler Inhale 2 puffs into the lungs every 4 (four) hours as needed for wheezing or shortness of breath.     amLODipine (  NORVASC) 2.5 MG tablet Take 0.5 tablets (1.25 mg total) by mouth daily. 90 tablet 0   aspirin EC 81 MG tablet Take 81 mg by mouth daily.      calcium-vitamin D (OSCAL WITH D) 500-5 MG-MCG tablet Take 1 tablet by mouth daily. 90 tablet 3   ferrous sulfate 325 (65 FE) MG tablet Take by mouth.     fluticasone (FLONASE) 50 MCG/ACT nasal spray Place 1 spray into both nostrils daily as needed.     hydrochlorothiazide (HYDRODIURIL) 25 MG tablet Take 25 mg by mouth daily.      latanoprost (XALATAN) 0.005 % ophthalmic solution Place 1 drop into both eyes at bedtime.      montelukast (SINGULAIR) 10 MG tablet Take 10 mg by mouth daily.      Multiple Vitamin (MULTI-VITAMIN) tablet Take 1 tablet by mouth daily.     predniSONE (DELTASONE) 5 MG tablet TAKE 1 TABLET DAILY WITH BREAKFAST 90 tablet 3    vitamin B-12 (CYANOCOBALAMIN) 1000 MCG tablet Take 1,000 mcg by mouth daily.      abiraterone acetate (ZYTIGA) 250 MG tablet TAKE 3 TABLETS DAILY. TAKE ON AN EMPTY STOMACH 1 HOUR BEFORE OR 2 HOURS AFTER A MEAL 90 tablet 0   No current facility-administered medications for this visit.     Performance status (ECOG): 1  Vital Signs: Blood pressure (!) 149/77, pulse 87, temperature 99.6 F (37.6 C), resp. rate 18, weight 232 lb (105.2 kg). Physical Exam Constitutional:      General: He is not in acute distress.    Appearance: He is not diaphoretic.  HENT:     Head: Normocephalic and atraumatic.     Nose: Nose normal.     Mouth/Throat:     Pharynx: No oropharyngeal exudate.  Eyes:     General: No scleral icterus.    Pupils: Pupils are equal, round, and reactive to light.  Cardiovascular:     Rate and Rhythm: Normal rate and regular rhythm.     Heart sounds: No murmur heard. Pulmonary:     Effort: Pulmonary effort is normal. No respiratory distress.     Breath sounds: No rales.  Chest:     Chest wall: No tenderness.  Abdominal:     General: There is no distension.     Palpations: Abdomen is soft.     Tenderness: There is no abdominal tenderness.  Musculoskeletal:        General: Normal range of motion.     Cervical back: Normal range of motion and neck supple.  Skin:    General: Skin is warm and dry.     Findings: No erythema.  Neurological:     Mental Status: He is alert and oriented to person, place, and time.     Cranial Nerves: No cranial nerve deficit.     Motor: No abnormal muscle tone.     Coordination: Coordination normal.  Psychiatric:        Mood and Affect: Affect normal.    Labs    Latest Ref Rng & Units 11/17/2022    2:26 PM 07/06/2022    9:54 AM 02/20/2022   11:38 AM  CBC  WBC 4.0 - 10.5 K/uL 10.3  8.7  9.5   Hemoglobin 13.0 - 17.0 g/dL 12.9  13.1  12.8   Hematocrit 39.0 - 52.0 % 38.7  40.5  39.8   Platelets 150 - 400 K/uL 203  177  175       Latest  Ref Rng &  Units 11/17/2022    2:26 PM 07/06/2022    9:54 AM 02/20/2022   11:38 AM  CMP  Glucose 70 - 99 mg/dL 135  88  94   BUN 8 - 23 mg/dL '13  10  10   '$ Creatinine 0.61 - 1.24 mg/dL 0.95  0.95  0.94   Sodium 135 - 145 mmol/L 142  139  137   Potassium 3.5 - 5.1 mmol/L 4.1  4.0  3.8   Chloride 98 - 111 mmol/L 101  99  99   CO2 22 - 32 mmol/L 31  33  31   Calcium 8.9 - 10.3 mg/dL 9.1  9.2  8.8   Total Protein 6.5 - 8.1 g/dL 6.9  6.8  6.6   Total Bilirubin 0.3 - 1.2 mg/dL 0.8  1.1  0.8   Alkaline Phos 38 - 126 U/L 53  42  50   AST 15 - 41 U/L '23  28  30   '$ ALT 0 - 44 U/L 15  21  27

## 2022-11-17 NOTE — Assessment & Plan Note (Signed)
currently on hydrochlorothiazide 25 mg daily.   Norvasc 2.5 mg if his SBP is persistently above 150 he may utilize low-dose Norvasc for BP control.  BP is stable.

## 2022-11-18 ENCOUNTER — Other Ambulatory Visit (HOSPITAL_COMMUNITY): Payer: Self-pay

## 2022-11-19 ENCOUNTER — Telehealth: Payer: Self-pay

## 2022-11-19 ENCOUNTER — Other Ambulatory Visit (HOSPITAL_COMMUNITY): Payer: Self-pay

## 2022-11-19 NOTE — Telephone Encounter (Signed)
Oral Oncology Patient Advocate Encounter  Was successful in securing patient a $8,000 grant from [facility] to provide copayment coverage for Abiraterone.  This will keep the out of pocket expense at $0.     [employer] ID: [id number]   The billing information is as follows and has been shared with [facility].    RxBin: [id number] PCN: [id number] Member ID: [id number] Group ID: [id number] Dates of Eligibility: 10/20/22 through 10/20/23  Fund:  [place] [place]  [phone] (phone) [phone] (fax) 11/19/2022 4:00 PM

## 2022-11-24 ENCOUNTER — Other Ambulatory Visit (HOSPITAL_COMMUNITY): Payer: Self-pay

## 2022-11-24 ENCOUNTER — Other Ambulatory Visit: Payer: Self-pay

## 2022-11-24 ENCOUNTER — Encounter: Payer: Self-pay | Admitting: Oncology

## 2022-12-16 ENCOUNTER — Other Ambulatory Visit (HOSPITAL_COMMUNITY): Payer: Self-pay

## 2022-12-16 ENCOUNTER — Other Ambulatory Visit: Payer: Self-pay | Admitting: Pharmacist

## 2022-12-16 DIAGNOSIS — C61 Malignant neoplasm of prostate: Secondary | ICD-10-CM

## 2022-12-16 MED ORDER — ABIRATERONE ACETATE 250 MG PO TABS
ORAL_TABLET | ORAL | 3 refills | Status: DC
  Filled 2022-12-16: qty 90, fill #0
  Filled 2022-12-17: qty 90, 30d supply, fill #0
  Filled 2023-01-08: qty 90, 30d supply, fill #1
  Filled 2023-02-15: qty 90, 30d supply, fill #2
  Filled 2023-03-11: qty 90, 30d supply, fill #3

## 2022-12-17 ENCOUNTER — Other Ambulatory Visit: Payer: Self-pay

## 2022-12-17 ENCOUNTER — Other Ambulatory Visit (HOSPITAL_COMMUNITY): Payer: Self-pay

## 2023-01-06 ENCOUNTER — Other Ambulatory Visit (HOSPITAL_COMMUNITY): Payer: Self-pay

## 2023-01-06 ENCOUNTER — Inpatient Hospital Stay: Payer: Medicare Other | Admitting: Oncology

## 2023-01-06 ENCOUNTER — Inpatient Hospital Stay: Payer: Medicare Other

## 2023-01-06 ENCOUNTER — Telehealth: Payer: Self-pay

## 2023-01-06 NOTE — Assessment & Plan Note (Deleted)
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 Castration sensitive local recurrence.  Currently receiving Eligard every 6 months - last received in Nov 2023.  Continue Abiateron 750 mg plus prednisone 5 mg daily.  PSA 0.01, stable.  Recommend patient to report symptoms of stomach discomfort and may consider PPI or H2 blocker for GI prophylaxis.

## 2023-01-06 NOTE — Telephone Encounter (Signed)
Today's appt (2/21) are old appts and pt does not need to come. Confirmed with MD and spoke to pt. Informed him to keep appts in April. He verbalized understanding.

## 2023-01-08 ENCOUNTER — Other Ambulatory Visit (HOSPITAL_COMMUNITY): Payer: Self-pay

## 2023-01-15 ENCOUNTER — Other Ambulatory Visit (HOSPITAL_COMMUNITY): Payer: Self-pay

## 2023-02-04 ENCOUNTER — Ambulatory Visit
Admission: RE | Admit: 2023-02-04 | Discharge: 2023-02-04 | Disposition: A | Discharge status: Home / Self Care | Payer: Medicare Other | Source: Ambulatory Visit | Attending: Radiation Oncology | Admitting: Radiation Oncology

## 2023-02-04 ENCOUNTER — Other Ambulatory Visit (HOSPITAL_COMMUNITY): Payer: Self-pay

## 2023-02-04 ENCOUNTER — Other Ambulatory Visit: Payer: Self-pay | Admitting: *Deleted

## 2023-02-04 ENCOUNTER — Encounter: Payer: Self-pay | Admitting: Radiation Oncology

## 2023-02-04 VITALS — BP 139/75 | HR 79 | Temp 98.0°F | Resp 16 | Ht 73.0 in | Wt 233.0 lb

## 2023-02-04 DIAGNOSIS — Z923 Personal history of irradiation: Secondary | ICD-10-CM | POA: Insufficient documentation

## 2023-02-04 DIAGNOSIS — C61 Malignant neoplasm of prostate: Secondary | ICD-10-CM | POA: Insufficient documentation

## 2023-02-04 NOTE — Progress Notes (Signed)
Radiation Oncology Follow up Note  Name: James Lucas   Date:   02/04/2023 MRN:  [id number] DOB: 1941/01/05    This 82 y.o. male presents to the clinic today for 2-1/2-year follow-up.  Status post IMRT radiation therapy in a salvage mood for patient now 12 years out status post cryotherapy for Gleason 9 adenocarcinoma the prostate  REFERRING PROVIDER: Gauger, Victoriano Lain, *  HPI: Patient is an 82 year old male now out over 2-1/2 years having completed salvage radiation therapy in a patient over 12 years out status post cryotherapy for Gleason 9 adenocarcinoma.  Seen today in routine follow-up he is doing well.  He specifically denies any increased lower urinary tract symptoms diarrhea or fatigue.  His most recent PSA continues to be stable at less than [id number].  COMPLICATIONS OF TREATMENT: none  FOLLOW UP COMPLIANCE: keeps appointments   PHYSICAL EXAM:  BP 139/75 Comment: Recheck WNL  Pulse 79   Temp 98 F (36.7 C) (Tympanic)   Resp 16   Ht 6\' 1"  (1.854 m) Comment: stated HT  Wt 233 lb (105.7 kg)   BMI 30.74 kg/m  Well-developed well-nourished patient in NAD. HEENT reveals PERLA, EOMI, discs not visualized.  Oral cavity is clear. No oral mucosal lesions are identified. Neck is clear without evidence of cervical or supraclavicular adenopathy. Lungs are clear to A&P. Cardiac examination is essentially unremarkable with regular rate and rhythm without murmur rub or thrill. Abdomen is benign with no organomegaly or masses noted. Motor sensory and DTR levels are equal and symmetric in the upper and lower extremities. Cranial nerves II through XII are grossly intact. Proprioception is intact. No peripheral adenopathy or edema is identified. No motor or sensory levels are noted. Crude visual fields are within normal range.  RADIOLOGY RESULTS: No current films for review  PLAN: Present time patient is doing well under excellent biochemical control of his prostate cancer.  And pleased with his  overall progress.  I have asked to see him back in 1 year with a follow-up PSA at that time.  Patient is to call sooner with any concerns.  I would like to take this opportunity to thank you for allowing me to participate in the care of your patient.Noreene Filbert, MD

## 2023-02-10 ENCOUNTER — Other Ambulatory Visit (HOSPITAL_COMMUNITY): Payer: Self-pay

## 2023-02-12 ENCOUNTER — Other Ambulatory Visit (HOSPITAL_COMMUNITY): Payer: Self-pay

## 2023-02-15 ENCOUNTER — Other Ambulatory Visit (HOSPITAL_COMMUNITY): Payer: Self-pay

## 2023-02-16 ENCOUNTER — Other Ambulatory Visit: Payer: Self-pay

## 2023-03-08 ENCOUNTER — Inpatient Hospital Stay: Payer: Medicare Other | Attending: Oncology

## 2023-03-08 ENCOUNTER — Inpatient Hospital Stay (HOSPITAL_BASED_OUTPATIENT_CLINIC_OR_DEPARTMENT_OTHER): Payer: Medicare Other | Admitting: Oncology

## 2023-03-08 ENCOUNTER — Inpatient Hospital Stay: Payer: Medicare Other

## 2023-03-08 ENCOUNTER — Encounter: Payer: Self-pay | Admitting: Oncology

## 2023-03-08 VITALS — BP 140/72 | HR 92 | Temp 97.5°F | Resp 18 | Wt 234.2 lb

## 2023-03-08 DIAGNOSIS — C61 Malignant neoplasm of prostate: Secondary | ICD-10-CM | POA: Insufficient documentation

## 2023-03-08 DIAGNOSIS — I10 Essential (primary) hypertension: Secondary | ICD-10-CM | POA: Insufficient documentation

## 2023-03-08 DIAGNOSIS — Z7952 Long term (current) use of systemic steroids: Secondary | ICD-10-CM | POA: Diagnosis not present

## 2023-03-08 DIAGNOSIS — Z8551 Personal history of malignant neoplasm of bladder: Secondary | ICD-10-CM | POA: Diagnosis present

## 2023-03-08 DIAGNOSIS — Z905 Acquired absence of kidney: Secondary | ICD-10-CM | POA: Diagnosis not present

## 2023-03-08 DIAGNOSIS — Z7982 Long term (current) use of aspirin: Secondary | ICD-10-CM | POA: Insufficient documentation

## 2023-03-08 DIAGNOSIS — D649 Anemia, unspecified: Secondary | ICD-10-CM | POA: Diagnosis not present

## 2023-03-08 DIAGNOSIS — Z79899 Other long term (current) drug therapy: Secondary | ICD-10-CM | POA: Insufficient documentation

## 2023-03-08 DIAGNOSIS — Z87891 Personal history of nicotine dependence: Secondary | ICD-10-CM | POA: Diagnosis not present

## 2023-03-08 LAB — COMPREHENSIVE METABOLIC PANEL
ALT: 17 U/L (ref 0–44)
AST: 25 U/L (ref 15–41)
Albumin: 4.1 g/dL (ref 3.5–5.0)
Alkaline Phosphatase: 50 U/L (ref 38–126)
Anion gap: 10 (ref 5–15)
BUN: 14 mg/dL (ref 8–23)
CO2: 30 mmol/L (ref 22–32)
Calcium: 9.1 mg/dL (ref 8.9–10.3)
Chloride: 98 mmol/L (ref 98–111)
Creatinine, Ser: 0.9 mg/dL (ref 0.61–1.24)
GFR, Estimated: >60 mL/min (ref >60)
Glucose, Bld: 96 mg/dL (ref 70–99)
Potassium: 3.8 mmol/L (ref 3.5–5.1)
Sodium: 138 mmol/L (ref 135–145)
Total Bilirubin: 0.7 mg/dL (ref 0.3–1.2)
Total Protein: 6.8 g/dL (ref 6.5–8.1)

## 2023-03-08 LAB — CBC WITH DIFFERENTIAL/PLATELET
Abs Immature Granulocytes: 0.03 10*3/uL (ref 0.00–0.07)
Basophils Absolute: 0.1 10*3/uL (ref 0.0–0.1)
Basophils Relative: 1 %
Eosinophils Absolute: 0.2 10*3/uL (ref 0.0–0.5)
Eosinophils Relative: 2 %
HCT: 39.6 % (ref 39.0–52.0)
Hemoglobin: 12.8 g/dL — ABNORMAL LOW (ref 13.0–17.0)
Immature Granulocytes: 0 %
Lymphocytes Relative: 19 %
Lymphs Abs: 1.9 10*3/uL (ref 0.7–4.0)
MCH: 31.8 pg (ref 26.0–34.0)
MCHC: 32.3 g/dL (ref 30.0–36.0)
MCV: 98.3 fL (ref 80.0–100.0)
Monocytes Absolute: 0.7 10*3/uL (ref 0.1–1.0)
Monocytes Relative: 7 %
Neutro Abs: 7 10*3/uL (ref 1.7–7.7)
Neutrophils Relative %: 71 %
Platelets: 193 10*3/uL (ref 150–400)
RBC: 4.03 MIL/uL — ABNORMAL LOW (ref 4.22–5.81)
RDW: 12 % (ref 11.5–15.5)
WBC: 9.8 10*3/uL (ref 4.0–10.5)
nRBC: 0 % (ref 0.0–0.2)

## 2023-03-08 LAB — PSA: Prostatic Specific Antigen: 0.01 ng/mL (ref 0.00–4.00)

## 2023-03-08 MED ORDER — OMEPRAZOLE 20 MG PO CPDR: 20.0000 mg | DELAYED_RELEASE_CAPSULE | Freq: Every day | ORAL | 3 refills | Status: DC | PRN

## 2023-03-08 NOTE — Progress Notes (Signed)
Hematology/Oncology Progress note [phone] [phone]      Chief Complaint: James Lucas is a 82 y.o. male with a long-standing history of prostate cancer who presents for follow up.   ASSESSMENT & PLAN:   Prostate cancer [facility]) # Recurrent prostate Cancer- diagnosed 2009 s/p cryotherapy and biochemical recurrence in 2010, s/p IMRT, 08/2020 biopsy proven persistent local recurrence, not a candidate for additional RT. Castration sensitive local recurrence.  Currently receiving Eligard every 6 months - next Due May 2023.  Continue Abiateron 750 mg plus prednisone 5 mg daily.  PSA 0.01, stable. Today's level is pending.   I recommend PPI for GI prophylaxis. He prefers to just take omeprazole 20mg  daily PRN heart burn.    Normocytic anemia Stable. monitor  Essential hypertension currently on hydrochlorothiazide 25 mg daily.    Norvasc 2.5 mg if his SBP is persistently above 150 he may utilize low-dose Norvasc for BP control.  BP is stable.  Orders Placed This Encounter  Procedures   CBC with Differential (Cancer Center Only)    Standing Status:   Future    Standing Expiration Date:   03/07/2024   CMP (Cancer Center only)    Standing Status:   Future    Standing Expiration Date:   03/07/2024   PSA    Standing Status:   Future    Standing Expiration Date:   03/07/2024   Follow up in 3 months.  All questions were answered. The patient knows to call the clinic with any problems, questions or concerns.  Rickard Patience, MD, PhD [facility] Hematology Oncology 03/08/2023   PERTINENT ONCOLOGY HISTORY Patient previously followed up by Dr.Corcoran, patient switched care to me on 07/03/21 Extensive medical record review was performed by me Per note,  # 2009 prostate cancer .  Gleason score was 4+3 in 2009.  He received cryoablation. # 2010 PSA rose to 1.4, biochemical recurrence.  Prostate biopsy in 04/2009 revealed Gleason 5+4 adenocarcinoma.  He received IMRT  with adjuvant androgen deprivation therapy.  PSA nadir was < 0.1.  05/23/2014  Prostate MRI revealed areas suspicious for extracapsular extension in the right posterior apex and along the posterior aspect of the anterior urethra.  ADT was discussed but due to history of side effects he elected to continue surveillance.  #2016 PSA increased and he was started on Lupron, intermittently secondary to side effects.  # 01/2016 PSA increased to 3.64 05/01/2016 CT revealed no evidence of recurrent or metastatic disease. Bone scan on 05/01/2016 showed no evidence of osseous metastatic disease # 07/10/2016 Seen by Dr.Finnegan, started on Leupron.  # 10/26/2016 Dr. Griffin Basil at [facility] recommended discontinuation of Lupron as his PSA was < 4.0. He was to pursue intermittent therapy when PSA increased to about 10.  Testosterone and PSA checks were recommended every 2 months and PET/CT scan with F-18 fluciclovine to assess for oligomenorrhea static disease if PSA began to rise.  12/02/2017 Axumin PET scan revealed potential local recurrence in the residual left prostate gland, with the region of activity relatively small at approximately 1.5 cm. No evidence of metastatic disease in the abdomen pelvis or skeleton 09/02/2018 by Dr. Orlie Dakin.  At that time, PSA was 5.98.  He agreed to reinitiate Lupron (received 09/12/2018).  07/02/2020 PET Axumin scan on  revealed intense radiotracer activity in the LEFT prostate gland concern for residual carcinoma.  There was no significant change from PET-CT 09/09/2018.  There was no nodal metastasis or skeletal metastasis.  He is s/p RIGHT  nephrectomy.  08/22/2020 revealed prostatic adenocarcinoma in 9/12 (six from the left side; 3 from the right side) biopsies.  The tumor showed mixed morphology with some areas showing ADT treatment effect and other areas without significant treatment effect.  Pattern was Gleason 4 and 5 in areas of less treatment effect.  The background prostate  was fibrotic c/w radiation effect.   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  He undergoes surveillance cystoscopy by Dr. Raoul Pitch [facility]).   Last cystoscopy was normal on 01/12/2019.  on Eligard 45mg  Q6 months. Last received on 04/02/2021 07/10/2021, patient started on Abiateron 750 mg daily and prednisone 5 mg daily.  Interval History:  INTERVAL HISTORY James Lucas is a 81 y.o. male who has above history reviewed by me today presents for follow up visit for management of prostate cancer He missed his appt recently, and rescheduled to today.  He is on Abiaterone and prednisone, tolerates well.  Blood pressure is well controlled.    Review of Systems  Constitutional:  Positive for fatigue. Negative for appetite change, chills, fever and unexpected weight change.  HENT:   Negative for hearing loss and voice change.   Eyes:  Negative for eye problems and icterus.  Respiratory:  Negative for chest tightness, cough and shortness of breath.   Cardiovascular:  Negative for chest pain and leg swelling.  Gastrointestinal:  Negative for abdominal distention and abdominal pain.  Endocrine: Positive for hot flashes.  Genitourinary:  Negative for difficulty urinating, dysuria and frequency.   Skin:  Negative for itching and rash.  Neurological:  Negative for light-headedness and numbness.  Hematological:  Negative for adenopathy. Does not bruise/bleed easily.  Psychiatric/Behavioral:  Negative for confusion.      Past Medical History:  Diagnosis Date   Allergic rhinitis    Bladder cancer    BPH (benign prostatic hyperplasia)    Chronic back pain    Essential hypertension    Glaucoma    Impotence of organic origin    Liver laceration    Prostate cancer    Stenosis, spinal, lumbar     Past Surgical History:  Procedure Laterality Date   BACK SURGERY     COLONOSCOPY     COLONOSCOPY WITH PROPOFOL N/A  08/24/2019   Procedure: COLONOSCOPY WITH PROPOFOL;  Surgeon: Christena Deem, MD;  Location: [facility] ENDOSCOPY;  Service: Endoscopy;  Laterality: N/A;   HERNIA REPAIR     NEPHRECTOMY Right 1978   TRANSURETHRAL RESECTION OF BLADDER TUMOR WITH GYRUS (TURBT-GYRUS)      Family History  Problem Relation Age of Onset   Cancer Father     Social History:  reports that he quit smoking about 59 years ago. His smoking use included cigarettes. He has never used smokeless tobacco. He reports that he does not drink alcohol and does not use drugs. He is a former smoker and smoked several packs per day in his early 65s but has not smoked in over 50 years. He does not drink alcohol. He is retired but used to work for [employer]. He denies known exposure to radiation or toxins. The patient is alone today.   Allergies:  Allergies  Allergen Reactions   Ace Inhibitors Cough    Current Medications: Current Outpatient Medications  Medication Sig Dispense Refill   abiraterone acetate (ZYTIGA) 250 MG tablet TAKE 3 TABLETS BY MOUTH DAILY. TAKE ON AN EMPTY STOMACH 1 HOUR BEFORE OR 2 HOURS  AFTER A MEAL 90 tablet 3   albuterol (VENTOLIN HFA) 108 (90 Base) MCG/ACT inhaler Inhale 2 puffs into the lungs every 4 (four) hours as needed for wheezing or shortness of breath.     amLODipine (NORVASC) 2.5 MG tablet Take 0.5 tablets (1.25 mg total) by mouth daily. 90 tablet 0   aspirin EC 81 MG tablet Take 81 mg by mouth daily.      calcium-vitamin D (OSCAL WITH D) 500-5 MG-MCG tablet Take 1 tablet by mouth daily. 90 tablet 3   ferrous sulfate 325 (65 FE) MG tablet Take by mouth.     fluticasone (FLONASE) 50 MCG/ACT nasal spray Place 1 spray into both nostrils daily as needed.     hydrochlorothiazide (HYDRODIURIL) 25 MG tablet Take 25 mg by mouth daily.      latanoprost (XALATAN) 0.005 % ophthalmic solution Place 1 drop into both eyes at bedtime.      montelukast (SINGULAIR) 10 MG tablet Take 10 mg by mouth daily.      Multiple  Vitamin (MULTI-VITAMIN) tablet Take 1 tablet by mouth daily.     omeprazole (PRILOSEC) 20 MG capsule Take 1 capsule (20 mg total) by mouth daily as needed. 30 capsule 3   predniSONE (DELTASONE) 5 MG tablet TAKE 1 TABLET DAILY WITH BREAKFAST 90 tablet 3   vitamin B-12 (CYANOCOBALAMIN) 1000 MCG tablet Take 1,000 mcg by mouth daily.      No current facility-administered medications for this visit.     Performance status (ECOG): 1  Vital Signs: Blood pressure (!) 140/72, pulse 92, temperature (!) 97.5 F (36.4 C), temperature source Tympanic, resp. rate 18, weight 234 lb 3.2 oz (106.2 kg), SpO2 100 %. Physical Exam Constitutional:      General: He is not in acute distress.    Appearance: He is not diaphoretic.  HENT:     Head: Normocephalic and atraumatic.     Nose: Nose normal.     Mouth/Throat:     Pharynx: No oropharyngeal exudate.  Eyes:     General: No scleral icterus.    Pupils: Pupils are equal, round, and reactive to light.  Cardiovascular:     Rate and Rhythm: Normal rate and regular rhythm.     Heart sounds: No murmur heard. Pulmonary:     Effort: Pulmonary effort is normal. No respiratory distress.     Breath sounds: No rales.  Chest:     Chest wall: No tenderness.  Abdominal:     General: There is no distension.     Palpations: Abdomen is soft.     Tenderness: There is no abdominal tenderness.  Musculoskeletal:        General: Normal range of motion.     Cervical back: Normal range of motion and neck supple.  Skin:    General: Skin is warm and dry.     Findings: No erythema.  Neurological:     Mental Status: He is alert and oriented to person, place, and time.     Cranial Nerves: No cranial nerve deficit.     Motor: No abnormal muscle tone.     Coordination: Coordination normal.  Psychiatric:        Mood and Affect: Affect normal.    Labs    Latest Ref Rng & Units 03/08/2023   12:58 PM 11/17/2022    2:26 PM 07/06/2022    9:54 AM  CBC  WBC 4.0 - 10.5  K/uL 9.8  10.3  8.7   Hemoglobin 13.0 -  17.0 g/dL 91.4  78.2  95.6   Hematocrit 39.0 - 52.0 % 39.6  38.7  40.5   Platelets 150 - 400 K/uL 193  203  177       Latest Ref Rng & Units 03/08/2023   12:58 PM 11/17/2022    2:26 PM 07/06/2022    9:54 AM  CMP  Glucose 70 - 99 mg/dL 96  213  88   BUN 8 - 23 mg/dL Creatinine 0.61 - 1.24 mg/dL 0.86  5.78  4.69   Sodium 135 - 145 mmol/L 138  142  139   Potassium 3.5 - 5.1 mmol/L 3.8  4.1  4.0   Chloride 98 - 111 mmol/L 98  101  99   CO2 22 - 32 mmol/L 30  31  33   Calcium 8.9 - 10.3 mg/dL 9.1  9.1  9.2   Total Protein 6.5 - 8.1 g/dL 6.8  6.9  6.8   Total Bilirubin 0.3 - 1.2 mg/dL 0.7  0.8  1.1   Alkaline Phos 38 - 126 U/L 50  53  42   AST 15 - 41 U/L ALT 0 - 44 U/L 17  15  21

## 2023-03-08 NOTE — Assessment & Plan Note (Signed)
Stable  monitor

## 2023-03-08 NOTE — Assessment & Plan Note (Addendum)
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 Castration sensitive local recurrence.  Currently receiving Eligard every 6 months - next Due May 2023.  Continue Abiateron 750 mg plus prednisone 5 mg daily.  PSA 0.01, stable. Today's level is pending.   I recommend PPI for GI prophylaxis. He prefers to just take omeprazole  daily PRN heart burn.

## 2023-03-08 NOTE — Assessment & Plan Note (Signed)
currently on hydrochlorothiazide 25 mg daily.   Norvasc 2.5 mg if his SBP is persistently above 150 he may utilize low-dose Norvasc for BP control.  BP is stable. 

## 2023-03-11 ENCOUNTER — Other Ambulatory Visit (HOSPITAL_COMMUNITY): Payer: Self-pay

## 2023-03-12 ENCOUNTER — Other Ambulatory Visit (HOSPITAL_COMMUNITY): Payer: Self-pay

## 2023-04-05 ENCOUNTER — Inpatient Hospital Stay: Payer: Medicare Other | Attending: Oncology

## 2023-04-05 DIAGNOSIS — Z8551 Personal history of malignant neoplasm of bladder: Secondary | ICD-10-CM | POA: Diagnosis present

## 2023-04-05 DIAGNOSIS — Z79818 Long term (current) use of other agents affecting estrogen receptors and estrogen levels: Secondary | ICD-10-CM | POA: Insufficient documentation

## 2023-04-05 DIAGNOSIS — C61 Malignant neoplasm of prostate: Secondary | ICD-10-CM | POA: Insufficient documentation

## 2023-04-05 MED ORDER — LEUPROLIDE ACETATE (6 MONTH) 45 MG ~~LOC~~ KIT
45.0000 mg | PACK | Freq: Once | SUBCUTANEOUS | Status: AC
  Administered 2023-04-05: 45 mg via SUBCUTANEOUS
  Filled 2023-04-05: qty 45

## 2023-04-06 ENCOUNTER — Other Ambulatory Visit: Payer: Self-pay

## 2023-04-06 ENCOUNTER — Other Ambulatory Visit: Payer: Self-pay | Admitting: Pharmacist

## 2023-04-06 ENCOUNTER — Other Ambulatory Visit (HOSPITAL_COMMUNITY): Payer: Self-pay

## 2023-04-06 DIAGNOSIS — C61 Malignant neoplasm of prostate: Secondary | ICD-10-CM

## 2023-04-06 MED ORDER — ABIRATERONE ACETATE 250 MG PO TABS
ORAL_TABLET | ORAL | 3 refills | Status: DC
Start: 2023-04-06 — End: 2023-07-21
  Filled 2023-04-06: qty 90, 30d supply, fill #0
  Filled 2023-04-28: qty 90, 30d supply, fill #1
  Filled 2023-05-27: qty 90, 30d supply, fill #2
  Filled 2023-06-22: qty 90, 30d supply, fill #3

## 2023-04-07 ENCOUNTER — Other Ambulatory Visit (HOSPITAL_COMMUNITY): Payer: Self-pay

## 2023-04-28 ENCOUNTER — Other Ambulatory Visit: Payer: Self-pay

## 2023-05-03 ENCOUNTER — Other Ambulatory Visit (HOSPITAL_COMMUNITY): Payer: Self-pay

## 2023-05-25 ENCOUNTER — Other Ambulatory Visit: Payer: Self-pay

## 2023-05-25 ENCOUNTER — Other Ambulatory Visit (HOSPITAL_COMMUNITY): Payer: Self-pay

## 2023-05-25 ENCOUNTER — Telehealth: Payer: Self-pay

## 2023-05-25 NOTE — Telephone Encounter (Signed)
Received call from Dr. Clyda Greener nurse Waynetta Sandy, who stated that pt contacted their office to report c/o of bilat feet and leg swelling.   Called pt and spoke to him. He states that he is having bilat swelling to legs that has been going on for about 1 month. It's worse in the evening, but swelling doesnt fully go away. He denies pain or tenderness, no redness. He is asking if zytiga may be contributing to this. Please advise.

## 2023-05-26 ENCOUNTER — Inpatient Hospital Stay (HOSPITAL_BASED_OUTPATIENT_CLINIC_OR_DEPARTMENT_OTHER): Payer: Medicare Other | Admitting: Hospice and Palliative Medicine

## 2023-05-26 ENCOUNTER — Encounter: Payer: Self-pay | Admitting: Hospice and Palliative Medicine

## 2023-05-26 ENCOUNTER — Other Ambulatory Visit: Payer: Self-pay

## 2023-05-26 ENCOUNTER — Encounter: Payer: Self-pay | Admitting: Oncology

## 2023-05-26 ENCOUNTER — Inpatient Hospital Stay: Payer: Medicare Other | Attending: Oncology

## 2023-05-26 VITALS — BP 138/70 | HR 81 | Temp 97.9°F | Resp 18 | Wt 232.0 lb

## 2023-05-26 DIAGNOSIS — Z79899 Other long term (current) drug therapy: Secondary | ICD-10-CM | POA: Insufficient documentation

## 2023-05-26 DIAGNOSIS — C61 Malignant neoplasm of prostate: Secondary | ICD-10-CM

## 2023-05-26 DIAGNOSIS — R609 Edema, unspecified: Secondary | ICD-10-CM | POA: Diagnosis not present

## 2023-05-26 DIAGNOSIS — Z7952 Long term (current) use of systemic steroids: Secondary | ICD-10-CM | POA: Diagnosis not present

## 2023-05-26 DIAGNOSIS — D649 Anemia, unspecified: Secondary | ICD-10-CM

## 2023-05-26 DIAGNOSIS — Z8551 Personal history of malignant neoplasm of bladder: Secondary | ICD-10-CM | POA: Insufficient documentation

## 2023-05-26 DIAGNOSIS — Z7982 Long term (current) use of aspirin: Secondary | ICD-10-CM | POA: Insufficient documentation

## 2023-05-26 DIAGNOSIS — Z905 Acquired absence of kidney: Secondary | ICD-10-CM | POA: Diagnosis not present

## 2023-05-26 DIAGNOSIS — R600 Localized edema: Secondary | ICD-10-CM | POA: Diagnosis not present

## 2023-05-26 LAB — CMP (CANCER CENTER ONLY)
ALT: 27 U/L (ref 0–44)
AST: 30 U/L (ref 15–41)
Albumin: 4.2 g/dL (ref 3.5–5.0)
Alkaline Phosphatase: 48 U/L (ref 38–126)
Anion gap: 12 (ref 5–15)
BUN: 14 mg/dL (ref 8–23)
CO2: 30 mmol/L (ref 22–32)
Calcium: 9.4 mg/dL (ref 8.9–10.3)
Chloride: 98 mmol/L (ref 98–111)
Creatinine: 0.96 mg/dL (ref 0.61–1.24)
GFR, Estimated: >60 mL/min (ref >60)
Glucose, Bld: 102 mg/dL — ABNORMAL HIGH (ref 70–99)
Potassium: 4.4 mmol/L (ref 3.5–5.1)
Sodium: 140 mmol/L (ref 135–145)
Total Bilirubin: 0.8 mg/dL (ref 0.3–1.2)
Total Protein: 6.8 g/dL (ref 6.5–8.1)

## 2023-05-26 LAB — CBC (CANCER CENTER ONLY)
HCT: 38.4 % — ABNORMAL LOW (ref 39.0–52.0)
Hemoglobin: 12.6 g/dL — ABNORMAL LOW (ref 13.0–17.0)
MCH: 31.8 pg (ref 26.0–34.0)
MCHC: 32.8 g/dL (ref 30.0–36.0)
MCV: 97 fL (ref 80.0–100.0)
Platelet Count: 205 10*3/uL (ref 150–400)
RBC: 3.96 MIL/uL — ABNORMAL LOW (ref 4.22–5.81)
RDW: 12.2 % (ref 11.5–15.5)
WBC Count: 11.7 10*3/uL — ABNORMAL HIGH (ref 4.0–10.5)
nRBC: 0 % (ref 0.0–0.2)

## 2023-05-26 NOTE — Progress Notes (Signed)
Symptom Management Clinic [facility] [phone] [phone]  Patient Care Team: Myrene Buddy, NP as PCP - General (Internal Medicine) Rickard Patience, MD as Consulting Physician (Oncology) Carmina Miller, MD as Consulting Physician (Radiation Oncology)   NAME OF PATIENT: James Lucas  [id number]  01/23/1941   DATE OF VISIT: 05/26/23  REASON FOR CONSULT: James Lucas is a 82 y.o. male with multiple medical problems including recurrent castrate sensitive prostate cancer status post cryotherapy and IMRT.  Currently on treatment with Eligard and Abiateron with prednisone.   INTERVAL HISTORY: Patient presents James Lucas today for evaluation of bilateral pedal/ankle edema.  Patient says that he has noticed fairly consistent edema over the past month.  He says that generally edema is gone or reduced in the morning and worsens throughout the day.  It sometimes feels "tight" but he denies pain.  Denies weeping or wounds.  Denies chest pain or shortness of breath.  Of note, patient says that he started taking Norvasc a couple of months ago.  Denies any neurologic complaints. Denies recent fevers or illnesses. Denies any easy bleeding or bruising. Reports good appetite and denies weight loss. Denies chest pain. Denies any nausea, vomiting, constipation, or diarrhea. Denies urinary complaints. Patient offers no further specific complaints today.  PAST MEDICAL HISTORY: Past Medical History:  Diagnosis Date   Allergic rhinitis    Bladder cancer (HCC)    BPH (benign prostatic hyperplasia)    Chronic back pain    Essential hypertension    Glaucoma    Impotence of organic origin    Liver laceration    Prostate cancer (HCC)    Stenosis, spinal, lumbar     PAST SURGICAL HISTORY:  Past Surgical History:  Procedure Laterality Date   BACK SURGERY     COLONOSCOPY     COLONOSCOPY WITH PROPOFOL N/A 08/24/2019   Procedure: COLONOSCOPY  WITH PROPOFOL;  Surgeon: Christena Deem, MD;  Location: [facility] ENDOSCOPY;  Service: Endoscopy;  Laterality: N/A;   HERNIA REPAIR     NEPHRECTOMY Right 1978   TRANSURETHRAL RESECTION OF BLADDER TUMOR WITH GYRUS (TURBT-GYRUS)      HEMATOLOGY/ONCOLOGY HISTORY:  Oncology History   No history exists.    ALLERGIES:  is allergic to ace inhibitors.  MEDICATIONS:  Current Outpatient Medications  Medication Sig Dispense Refill   abiraterone acetate (ZYTIGA) 250 MG tablet TAKE 3 TABLETS BY MOUTH DAILY. TAKE ON AN EMPTY STOMACH 1 HOUR BEFORE OR 2 HOURS AFTER A MEAL 90 tablet 3   amLODipine (NORVASC) 2.5 MG tablet Take 0.5 tablets (1.25 mg total) by mouth daily. 90 tablet 0   aspirin EC 81 MG tablet Take 81 mg by mouth daily.      calcium-vitamin D (OSCAL WITH D) 500-5 MG-MCG tablet Take 1 tablet by mouth daily. 90 tablet 3   ferrous sulfate 325 (65 FE) MG tablet Take by mouth.     fluticasone (FLONASE) 50 MCG/ACT nasal spray Place 1 spray into both nostrils daily as needed.     hydrochlorothiazide (HYDRODIURIL) 25 MG tablet Take 25 mg by mouth daily.      latanoprost (XALATAN) 0.005 % ophthalmic solution Place 1 drop into both eyes at bedtime.      montelukast (SINGULAIR) 10 MG tablet Take 10 mg by mouth daily.      Multiple Vitamin (MULTI-VITAMIN) tablet Take 1 tablet by mouth daily.     omeprazole (PRILOSEC) 20 MG capsule Take 1 capsule (20 mg total) by mouth  daily as needed. 30 capsule 3   predniSONE (DELTASONE) 5 MG tablet TAKE 1 TABLET DAILY WITH BREAKFAST 90 tablet 3   triamterene-hydrochlorothiazide (MAXZIDE-25) 37.5-25 MG tablet Take 1 tablet by mouth daily.     vitamin B-12 (CYANOCOBALAMIN) 1000 MCG tablet Take 1,000 mcg by mouth daily.      albuterol (VENTOLIN HFA) 108 (90 Base) MCG/ACT inhaler Inhale 2 puffs into the lungs every 4 (four) hours as needed for wheezing or shortness of breath. (Patient not taking: Reported on 05/26/2023)     No current facility-administered medications  for this visit.    VITAL SIGNS: BP 138/70 (Patient Position: Sitting)   Pulse 81   Temp 97.9 F (36.6 C) (Tympanic)   Resp 18   Wt 232 lb (105.2 kg)   SpO2 99%   BMI 30.61 kg/m  Filed Weights   05/26/23 1318  Weight: 232 lb (105.2 kg)    Estimated body mass index is 30.61 kg/m as calculated from the following:   Height as of 02/04/23: 6\' 1"  (1.854 m).   Weight as of this encounter: 232 lb (105.2 kg).  LABS: CBC:    Component Value Date/Time   WBC 11.7 (H) 05/26/2023 1249   WBC 9.8 03/08/2023 1258   HGB 12.6 (L) 05/26/2023 1249   HCT 38.4 (L) 05/26/2023 1249   PLT 205 05/26/2023 1249   MCV 97.0 05/26/2023 1249   NEUTROABS 7.0 03/08/2023 1258   LYMPHSABS 1.9 03/08/2023 1258   MONOABS 0.7 03/08/2023 1258   EOSABS 0.2 03/08/2023 1258   BASOSABS 0.1 03/08/2023 1258   Comprehensive Metabolic Panel:    Component Value Date/Time   NA 140 05/26/2023 1249   K 4.4 05/26/2023 1249   CL 98 05/26/2023 1249   CO2 30 05/26/2023 1249   BUN 14 05/26/2023 1249   CREATININE 0.96 05/26/2023 1249   GLUCOSE 102 (H) 05/26/2023 1249   CALCIUM 9.4 05/26/2023 1249   AST 30 05/26/2023 1249   ALT 27 05/26/2023 1249   ALKPHOS 48 05/26/2023 1249   BILITOT 0.8 05/26/2023 1249   PROT 6.8 05/26/2023 1249   ALBUMIN 4.2 05/26/2023 1249    RADIOGRAPHIC STUDIES: No results found.  PERFORMANCE STATUS (ECOG) : 1 - Symptomatic but completely ambulatory  Review of Systems Unless otherwise noted, a complete review of systems is negative.  Physical Exam General: NAD Cardiovascular: regular rate and rhythm Pulmonary: clear ant fields Abdomen: soft, nontender, + bowel sounds GU: no suprapubic tenderness Extremities: BLE edema, no joint deformities, negative Homans' sign bilaterally Skin: no rashes Neurological: Weakness but otherwise nonfocal  IMPRESSION/PLAN: Prostate cancer - on treatment with Eligard and Abiateron with prednisone.   LE Edema -low clinical suspicion for DVT given  bilateral presentation and description of improvement in symptoms overnight/worsening throughout the day.  Timing of onset suggests Norvasc as a contributing factor.  Prednisone could also be contributing.  I suggested that patient speak with his PCP about possible alternative antihypertensives.  Would recommend conservative management including elevating feet and use of compression stockings/socks.  If no improvement, can consider further workup or referral to vascular.  Case and plan discussed with Dr. Cathie Hoops  Patient expressed understanding and was in agreement with this plan. He also understands that He can call clinic at any time with any questions, concerns, or complaints.   Thank you for allowing me to participate in the care of this very pleasant patient.   Time Total: 15 minutes  Visit consisted of counseling and education dealing with the complex  and emotionally intense issues of symptom management in the setting of serious illness.Greater than 50%  of this time was spent counseling and coordinating care related to the above assessment and plan.  Signed by: Laurette Schimke, PhD, NP-C

## 2023-05-26 NOTE — Progress Notes (Signed)
Patient here for [facility] bilateral leg swelling X 1 month. No redness or pain some tingling. Chronic SOB and some throbbing pain on right side of head

## 2023-05-26 NOTE — Telephone Encounter (Signed)
Called patient stated he can come at 1p labs per [facility]. Schedulers can you please add patient for labs and see Josh at 1p today. Thanks

## 2023-05-26 NOTE — Telephone Encounter (Signed)
Thx Dr.Yu ill add him to Josh's schedule, will he need labs?

## 2023-05-27 ENCOUNTER — Other Ambulatory Visit: Payer: Self-pay

## 2023-05-28 ENCOUNTER — Other Ambulatory Visit (HOSPITAL_COMMUNITY): Payer: Self-pay

## 2023-06-22 ENCOUNTER — Other Ambulatory Visit (HOSPITAL_COMMUNITY): Payer: Self-pay

## 2023-06-23 ENCOUNTER — Inpatient Hospital Stay (HOSPITAL_BASED_OUTPATIENT_CLINIC_OR_DEPARTMENT_OTHER): Payer: Medicare Other | Admitting: Oncology

## 2023-06-23 ENCOUNTER — Inpatient Hospital Stay: Payer: Medicare Other | Attending: Oncology

## 2023-06-23 ENCOUNTER — Encounter: Payer: Self-pay | Admitting: Oncology

## 2023-06-23 VITALS — BP 143/78 | HR 82 | Temp 97.5°F | Resp 18 | Wt 231.0 lb

## 2023-06-23 DIAGNOSIS — C61 Malignant neoplasm of prostate: Secondary | ICD-10-CM | POA: Insufficient documentation

## 2023-06-23 DIAGNOSIS — Z79899 Other long term (current) drug therapy: Secondary | ICD-10-CM | POA: Diagnosis not present

## 2023-06-23 DIAGNOSIS — Z7982 Long term (current) use of aspirin: Secondary | ICD-10-CM | POA: Insufficient documentation

## 2023-06-23 DIAGNOSIS — D649 Anemia, unspecified: Secondary | ICD-10-CM

## 2023-06-23 DIAGNOSIS — Z8551 Personal history of malignant neoplasm of bladder: Secondary | ICD-10-CM | POA: Insufficient documentation

## 2023-06-23 DIAGNOSIS — Z7952 Long term (current) use of systemic steroids: Secondary | ICD-10-CM | POA: Insufficient documentation

## 2023-06-23 DIAGNOSIS — I10 Essential (primary) hypertension: Secondary | ICD-10-CM

## 2023-06-23 DIAGNOSIS — Z87891 Personal history of nicotine dependence: Secondary | ICD-10-CM | POA: Insufficient documentation

## 2023-06-23 LAB — CBC WITH DIFFERENTIAL (CANCER CENTER ONLY)
Abs Immature Granulocytes: 0.05 10*3/uL (ref 0.00–0.07)
Basophils Absolute: 0.1 10*3/uL (ref 0.0–0.1)
Basophils Relative: 1 %
Eosinophils Absolute: 0.3 10*3/uL (ref 0.0–0.5)
Eosinophils Relative: 3 %
HCT: 40 % (ref 39.0–52.0)
Hemoglobin: 13.4 g/dL (ref 13.0–17.0)
Immature Granulocytes: 0 %
Lymphocytes Relative: 17 %
Lymphs Abs: 2 10*3/uL (ref 0.7–4.0)
MCH: 31.5 pg (ref 26.0–34.0)
MCHC: 33.5 g/dL (ref 30.0–36.0)
MCV: 94.1 fL (ref 80.0–100.0)
Monocytes Absolute: 0.6 10*3/uL (ref 0.1–1.0)
Monocytes Relative: 5 %
Neutro Abs: 8.3 10*3/uL — ABNORMAL HIGH (ref 1.7–7.7)
Neutrophils Relative %: 74 %
Platelet Count: 194 10*3/uL (ref 150–400)
RBC: 4.25 MIL/uL (ref 4.22–5.81)
RDW: 12 % (ref 11.5–15.5)
WBC Count: 11.2 10*3/uL — ABNORMAL HIGH (ref 4.0–10.5)
nRBC: 0 % (ref 0.0–0.2)

## 2023-06-23 LAB — CMP (CANCER CENTER ONLY)
ALT: 25 U/L (ref 0–44)
AST: 27 U/L (ref 15–41)
Albumin: 4.6 g/dL (ref 3.5–5.0)
Alkaline Phosphatase: 52 U/L (ref 38–126)
Anion gap: 9 (ref 5–15)
BUN: 11 mg/dL (ref 8–23)
CO2: 26 mmol/L (ref 22–32)
Calcium: 9.3 mg/dL (ref 8.9–10.3)
Chloride: 99 mmol/L (ref 98–111)
Creatinine: 1.07 mg/dL (ref 0.61–1.24)
GFR, Estimated: >60 mL/min (ref >60)
Glucose, Bld: 98 mg/dL (ref 70–99)
Potassium: 3.8 mmol/L (ref 3.5–5.1)
Sodium: 134 mmol/L — ABNORMAL LOW (ref 135–145)
Total Bilirubin: 0.6 mg/dL (ref 0.3–1.2)
Total Protein: 7.1 g/dL (ref 6.5–8.1)

## 2023-06-23 LAB — PSA: Prostatic Specific Antigen: <0.01 ng/mL (ref 0.00–4.00)

## 2023-06-23 MED ORDER — AMLODIPINE BESYLATE 2.5 MG PO TABS: 2.5000 mg | ORAL_TABLET | ORAL | 1 refills | Status: AC

## 2023-06-23 NOTE — Assessment & Plan Note (Addendum)
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 Castration sensitive local recurrence.  Currently receiving Eligard every 6 months - next Due November 2024.  Continue Abiateron 750 mg plus prednisone 5 mg daily.  PSA 0.01, stable. Today's level is pending.   I recommend PPI for GI prophylaxis. He prefers to just take omeprazole 20mg  daily PRN heart burn.

## 2023-06-23 NOTE — Progress Notes (Signed)
Hematology/Oncology Progress note [phone] Fax:([phone]      Chief Complaint: James Lucas is a 82 y.o. male with a long-standing history of prostate cancer who presents for follow up.   ASSESSMENT & PLAN:   Prostate cancer [facility]) # Recurrent prostate Cancer- diagnosed 2009 s/p cryotherapy and biochemical recurrence in 2010, s/p IMRT, 08/2020 biopsy proven persistent local recurrence, not a candidate for additional RT. Castration sensitive local recurrence.  Currently receiving Eligard every 6 months - next Due November 2024.  Continue Abiateron 750 mg plus prednisone 5 mg daily.  PSA 0.01, stable. Today's level is pending.   I recommend PPI for GI prophylaxis. He prefers to just take omeprazole 20mg  daily PRN heart burn.   Normocytic anemia Stable. Monitor  Hb has improved  Essential hypertension currently on Triamterene -hydrochlorothiazide 37.5-25 mg daily.    Norvasc 2.5 mg if his SBP is persistently above 150 he may utilize low-dose Norvasc for BP control.  BP is stable.  Orders Placed This Encounter  Procedures   CBC with Differential (Cancer Center Only)    Standing Status:   Future    Standing Expiration Date:   06/22/2024   CMP (Cancer Center only)    Standing Status:   Future    Standing Expiration Date:   06/22/2024   PSA    Standing Status:   Future    Standing Expiration Date:   06/22/2024   CBC with Differential (Cancer Center Only)    Standing Status:   Future    Standing Expiration Date:   06/22/2024   CMP (Cancer Center only)    Standing Status:   Future    Standing Expiration Date:   06/22/2024   PSA    Standing Status:   Future    Standing Expiration Date:   06/22/2024   Follow up in 3 months.  All questions were answered. The patient knows to call the clinic with any problems, questions or concerns.  James Patience, MD, PhD [facility] Hematology Oncology 06/23/2023   PERTINENT ONCOLOGY HISTORY Patient previously followed up by  Dr.Corcoran, patient switched care to me on 07/03/21 Extensive medical record review was performed by me Per note,  # 2009 prostate cancer .  Gleason score was 4+3 in 2009.  He received cryoablation. # 2010 PSA rose to 1.4, biochemical recurrence.  Prostate biopsy in 04/2009 revealed Gleason 5+4 adenocarcinoma.  He received IMRT with adjuvant androgen deprivation therapy.  PSA nadir was < 0.1.  05/23/2014  Prostate MRI revealed areas suspicious for extracapsular extension in the right posterior apex and along the posterior aspect of the anterior urethra.  ADT was discussed but due to history of side effects he elected to continue surveillance.  #2016 PSA increased and he was started on Lupron, intermittently secondary to side effects.  # 01/2016 PSA increased to 3.64 05/01/2016 CT revealed no evidence of recurrent or metastatic disease. Bone scan on 05/01/2016 showed no evidence of osseous metastatic disease # 07/10/2016 Seen by Dr.Finnegan, started on Leupron.  # 10/26/2016 Dr. Griffin Basil at [facility] recommended discontinuation of Lupron as his PSA was < 4.0. He was to pursue intermittent therapy when PSA increased to about 10.  Testosterone and PSA checks were recommended every 2 months and PET/CT scan with F-18 fluciclovine to assess for oligomenorrhea static disease if PSA began to rise.  12/02/2017 Axumin PET scan revealed potential local recurrence in the residual left prostate gland, with the region of activity relatively small at approximately 1.5 cm. No  evidence of metastatic disease in the abdomen pelvis or skeleton 09/02/2018 by Dr. Orlie Dakin.  At that time, PSA was 5.98.  He agreed to reinitiate Lupron (received 09/12/2018).  07/02/2020 PET Axumin scan on  revealed intense radiotracer activity in the LEFT prostate gland concern for residual carcinoma.  There was no significant change from PET-CT 09/09/2018.  There was no nodal metastasis or skeletal metastasis.  He is s/p RIGHT  nephrectomy.  08/22/2020 revealed prostatic adenocarcinoma in 9/12 (six from the left side; 3 from the right side) biopsies.  The tumor showed mixed morphology with some areas showing ADT treatment effect and other areas without significant treatment effect.  Pattern was Gleason 4 and 5 in areas of less treatment effect.  The background prostate was fibrotic c/w radiation effect.   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  He undergoes surveillance cystoscopy by Dr. Raoul Pitch [facility]).   Last cystoscopy was normal on 01/12/2019.  on Eligard 45mg  Q6 months. Last received on 04/02/2021 07/10/2021, patient started on Abiateron 750 mg daily and prednisone 5 mg daily.  Interval History:  INTERVAL HISTORY James Lucas is a 82 y.o. male who has above history reviewed by me today presents for follow up visit for management of prostate cancer He missed his appt recently, and rescheduled to today.  He is on Abiaterone and prednisone, tolerates well.  Patient reports being compliant with compression medications.   Review of Systems  Constitutional:  Positive for fatigue. Negative for appetite change, chills, fever and unexpected weight change.  HENT:   Negative for hearing loss and voice change.   Eyes:  Negative for eye problems and icterus.  Respiratory:  Negative for chest tightness, cough and shortness of breath.   Cardiovascular:  Negative for chest pain and leg swelling.  Gastrointestinal:  Negative for abdominal distention and abdominal pain.  Endocrine: Positive for hot flashes.  Genitourinary:  Negative for difficulty urinating, dysuria and frequency.   Skin:  Negative for itching and rash.  Neurological:  Negative for light-headedness and numbness.  Hematological:  Negative for adenopathy. Does not bruise/bleed easily.  Psychiatric/Behavioral:  Negative for confusion.      Past Medical History:  Diagnosis Date    Allergic rhinitis    Bladder cancer (HCC)    BPH (benign prostatic hyperplasia)    Chronic back pain    Essential hypertension    Glaucoma    Impotence of organic origin    Liver laceration    Prostate cancer (HCC)    Stenosis, spinal, lumbar     Past Surgical History:  Procedure Laterality Date   BACK SURGERY     COLONOSCOPY     COLONOSCOPY WITH PROPOFOL N/A 08/24/2019   Procedure: COLONOSCOPY WITH PROPOFOL;  Surgeon: Christena Deem, MD;  Location: [facility] ENDOSCOPY;  Service: Endoscopy;  Laterality: N/A;   HERNIA REPAIR     NEPHRECTOMY Right 1978   TRANSURETHRAL RESECTION OF BLADDER TUMOR WITH GYRUS (TURBT-GYRUS)      Family History  Problem Relation Age of Onset   Cancer Father     Social History:  reports that he quit smoking about 59 years ago. His smoking use included cigarettes. He has never used smokeless tobacco. He reports that he does not drink alcohol and does not use drugs. He is a former smoker and smoked several packs per day in his early 22s but has not smoked in over 50 years. He does not  drink alcohol. He is retired but used to work for [employer]. He denies known exposure to radiation or toxins. The patient is alone today.   Allergies:  Allergies  Allergen Reactions   Ace Inhibitors Cough    Current Medications: Current Outpatient Medications  Medication Sig Dispense Refill   abiraterone acetate (ZYTIGA) 250 MG tablet TAKE 3 TABLETS BY MOUTH DAILY. TAKE ON AN EMPTY STOMACH 1 HOUR BEFORE OR 2 HOURS AFTER A MEAL 90 tablet 3   aspirin EC 81 MG tablet Take 81 mg by mouth daily.      calcium-vitamin D (OSCAL WITH D) 500-5 MG-MCG tablet Take 1 tablet by mouth daily. 90 tablet 3   ferrous sulfate 325 (65 FE) MG tablet Take by mouth.     fluticasone (FLONASE) 50 MCG/ACT nasal spray Place 1 spray into both nostrils daily as needed.     hydrochlorothiazide (HYDRODIURIL) 25 MG tablet Take 25 mg by mouth daily.      latanoprost (XALATAN) 0.005 % ophthalmic solution  Place 1 drop into both eyes at bedtime.      montelukast (SINGULAIR) 10 MG tablet Take 10 mg by mouth daily.      Multiple Vitamin (MULTI-VITAMIN) tablet Take 1 tablet by mouth daily.     omeprazole (PRILOSEC) 20 MG capsule Take 1 capsule (20 mg total) by mouth daily as needed. 30 capsule 3   predniSONE (DELTASONE) 5 MG tablet TAKE 1 TABLET DAILY WITH BREAKFAST 90 tablet 3   triamterene-hydrochlorothiazide (MAXZIDE-25) 37.5-25 MG tablet Take 1 tablet by mouth daily.     vitamin B-12 (CYANOCOBALAMIN) 1000 MCG tablet Take 1,000 mcg by mouth daily.      albuterol (VENTOLIN HFA) 108 (90 Base) MCG/ACT inhaler Inhale 2 puffs into the lungs every 4 (four) hours as needed for wheezing or shortness of breath. (Patient not taking: Reported on 05/26/2023)     amLODipine (NORVASC) 2.5 MG tablet Take 1 tablet (2.5 mg total) by mouth See admin instructions. Take 1 tablet if systolic BP is persistently above 150 90 tablet 1   No current facility-administered medications for this visit.     Performance status (ECOG): 1  Vital Signs: Blood pressure (!) 143/78, pulse 82, temperature (!) 97.5 F (36.4 C), temperature source Tympanic, resp. rate 18, weight 231 lb (104.8 kg), SpO2 99%. Physical Exam Constitutional:      General: He is not in acute distress.    Appearance: He is not diaphoretic.  HENT:     Head: Normocephalic and atraumatic.     Nose: Nose normal.     Mouth/Throat:     Pharynx: No oropharyngeal exudate.  Eyes:     General: No scleral icterus.    Pupils: Pupils are equal, round, and reactive to light.  Cardiovascular:     Rate and Rhythm: Normal rate and regular rhythm.     Heart sounds: No murmur heard. Pulmonary:     Effort: Pulmonary effort is normal. No respiratory distress.     Breath sounds: No rales.  Chest:     Chest wall: No tenderness.  Abdominal:     General: There is no distension.     Palpations: Abdomen is soft.     Tenderness: There is no abdominal tenderness.   Musculoskeletal:        General: Normal range of motion.     Cervical back: Normal range of motion and neck supple.  Skin:    General: Skin is warm and dry.     Findings: No erythema.  Neurological:     Mental Status: He is alert and oriented to person, place, and time.     Cranial Nerves: No cranial nerve deficit.     Motor: No abnormal muscle tone.     Coordination: Coordination normal.  Psychiatric:        Mood and Affect: Affect normal.    Labs    Latest Ref Rng & Units 06/23/2023   12:41 PM 05/26/2023   12:49 PM 03/08/2023   12:58 PM  CBC  WBC 4.0 - 10.5 K/uL 11.2  11.7  9.8   Hemoglobin 13.0 - 17.0 g/dL 78.4  69.6  29.5   Hematocrit 39.0 - 52.0 % 40.0  38.4  39.6   Platelets 150 - 400 K/uL 194  205  193       Latest Ref Rng & Units 06/23/2023   12:41 PM 05/26/2023   12:49 PM 03/08/2023   12:58 PM  CMP  Glucose 70 - 99 mg/dL 98  284  96   BUN 8 - 23 mg/dL 11  14  14    Creatinine 0.61 - 1.24 mg/dL 1.32  4.40  1.02   Sodium 135 - 145 mmol/L 134  140  138   Potassium 3.5 - 5.1 mmol/L 3.8  4.4  3.8   Chloride 98 - 111 mmol/L 99  98  98   CO2 22 - 32 mmol/L 26  30  30    Calcium 8.9 - 10.3 mg/dL 9.3  9.4  9.1   Total Protein 6.5 - 8.1 g/dL 7.1  6.8  6.8   Total Bilirubin 0.3 - 1.2 mg/dL 0.6  0.8  0.7   Alkaline Phos 38 - 126 U/L 52  48  50   AST 15 - 41 U/L 27  30  25    ALT 0 - 44 U/L 25  27  17

## 2023-06-23 NOTE — Assessment & Plan Note (Signed)
currently on Triamterene -hydrochlorothiazide 37.5-25 mg daily.    Norvasc 2.5 mg if his SBP is persistently above 150 he may utilize low-dose Norvasc for BP control.  BP is stable.

## 2023-06-23 NOTE — Assessment & Plan Note (Signed)
Stable. Monitor  Hb has improved

## 2023-06-25 ENCOUNTER — Other Ambulatory Visit (HOSPITAL_COMMUNITY): Payer: Self-pay

## 2023-06-29 ENCOUNTER — Other Ambulatory Visit: Payer: Self-pay

## 2023-07-14 ENCOUNTER — Other Ambulatory Visit: Payer: Self-pay

## 2023-07-14 MED ORDER — OMEPRAZOLE 20 MG PO CPDR: 20.0000 mg | DELAYED_RELEASE_CAPSULE | Freq: Every day | ORAL | 2 refills | Status: DC | PRN

## 2023-07-21 ENCOUNTER — Other Ambulatory Visit: Payer: Self-pay

## 2023-07-21 ENCOUNTER — Other Ambulatory Visit: Payer: Self-pay | Admitting: Pharmacist

## 2023-07-21 ENCOUNTER — Other Ambulatory Visit (HOSPITAL_COMMUNITY): Payer: Self-pay

## 2023-07-21 DIAGNOSIS — C61 Malignant neoplasm of prostate: Secondary | ICD-10-CM

## 2023-07-21 MED ORDER — ABIRATERONE ACETATE 250 MG PO TABS
ORAL_TABLET | ORAL | 3 refills | Status: DC
  Filled 2023-07-21: qty 90, 30d supply, fill #0
  Filled 2023-08-17: qty 90, 30d supply, fill #1
  Filled 2023-09-10: qty 90, 30d supply, fill #2
  Filled 2023-10-05: qty 90, 30d supply, fill #3

## 2023-08-17 ENCOUNTER — Other Ambulatory Visit (HOSPITAL_COMMUNITY): Payer: Self-pay

## 2023-08-17 ENCOUNTER — Other Ambulatory Visit: Payer: Self-pay | Admitting: Pharmacy Technician

## 2023-08-17 NOTE — Progress Notes (Signed)
Specialty Pharmacy Refill Coordination Note  James Lucas is a 82 y.o. male contacted today regarding refills of specialty medication(s) Abiraterone Acetate .  Patient requested Delivery  on 08/24/23  to verified address [address] [place], [place]   Medication will be filled on 08/23/23.

## 2023-09-10 ENCOUNTER — Other Ambulatory Visit: Payer: Self-pay | Admitting: Oncology

## 2023-09-10 ENCOUNTER — Other Ambulatory Visit: Payer: Self-pay

## 2023-09-10 NOTE — Progress Notes (Signed)
Specialty Pharmacy Refill Coordination Note  James Lucas is a 82 y.o. male contacted today regarding refills of specialty medication(s) Abiraterone Acetate   Patient requested Delivery   Delivery date: 09/17/23   Verified address: [address]  [place] [address]   Medication will be filled on 09/16/23.

## 2023-09-13 ENCOUNTER — Encounter: Payer: Self-pay | Admitting: Oncology

## 2023-10-01 ENCOUNTER — Ambulatory Visit: Payer: Medicare Other

## 2023-10-01 ENCOUNTER — Other Ambulatory Visit: Payer: Medicare Other

## 2023-10-01 ENCOUNTER — Encounter: Payer: Self-pay | Admitting: Oncology

## 2023-10-01 ENCOUNTER — Inpatient Hospital Stay: Payer: Medicare Other

## 2023-10-01 ENCOUNTER — Inpatient Hospital Stay (HOSPITAL_BASED_OUTPATIENT_CLINIC_OR_DEPARTMENT_OTHER): Payer: Medicare Other | Admitting: Oncology

## 2023-10-01 ENCOUNTER — Inpatient Hospital Stay: Payer: Medicare Other | Attending: Oncology

## 2023-10-01 VITALS — BP 136/66 | HR 91 | Temp 97.4°F | Resp 18 | Wt 227.3 lb

## 2023-10-01 DIAGNOSIS — C61 Malignant neoplasm of prostate: Secondary | ICD-10-CM | POA: Diagnosis present

## 2023-10-01 DIAGNOSIS — D649 Anemia, unspecified: Secondary | ICD-10-CM | POA: Diagnosis not present

## 2023-10-01 DIAGNOSIS — Z8551 Personal history of malignant neoplasm of bladder: Secondary | ICD-10-CM | POA: Insufficient documentation

## 2023-10-01 DIAGNOSIS — I10 Essential (primary) hypertension: Secondary | ICD-10-CM

## 2023-10-01 DIAGNOSIS — Z79818 Long term (current) use of other agents affecting estrogen receptors and estrogen levels: Secondary | ICD-10-CM | POA: Insufficient documentation

## 2023-10-01 LAB — CBC WITH DIFFERENTIAL (CANCER CENTER ONLY)
Abs Immature Granulocytes: 0.03 10*3/uL (ref 0.00–0.07)
Basophils Absolute: 0.1 10*3/uL (ref 0.0–0.1)
Basophils Relative: 1 %
Eosinophils Absolute: 0.4 10*3/uL (ref 0.0–0.5)
Eosinophils Relative: 5 %
HCT: 39.3 % (ref 39.0–52.0)
Hemoglobin: 12.9 g/dL — ABNORMAL LOW (ref 13.0–17.0)
Immature Granulocytes: 0 %
Lymphocytes Relative: 25 %
Lymphs Abs: 2.2 10*3/uL (ref 0.7–4.0)
MCH: 32 pg (ref 26.0–34.0)
MCHC: 32.8 g/dL (ref 30.0–36.0)
MCV: 97.5 fL (ref 80.0–100.0)
Monocytes Absolute: 0.8 10*3/uL (ref 0.1–1.0)
Monocytes Relative: 10 %
Neutro Abs: 5 10*3/uL (ref 1.7–7.7)
Neutrophils Relative %: 59 %
Platelet Count: 224 10*3/uL (ref 150–400)
RBC: 4.03 MIL/uL — ABNORMAL LOW (ref 4.22–5.81)
RDW: 11.9 % (ref 11.5–15.5)
WBC Count: 8.6 10*3/uL (ref 4.0–10.5)
nRBC: 0 % (ref 0.0–0.2)

## 2023-10-01 LAB — CMP (CANCER CENTER ONLY)
ALT: 16 U/L (ref 0–44)
AST: 21 U/L (ref 15–41)
Albumin: 4.1 g/dL (ref 3.5–5.0)
Alkaline Phosphatase: 44 U/L (ref 38–126)
Anion gap: 10 (ref 5–15)
BUN: 10 mg/dL (ref 8–23)
CO2: 29 mmol/L (ref 22–32)
Calcium: 9.3 mg/dL (ref 8.9–10.3)
Chloride: 98 mmol/L (ref 98–111)
Creatinine: 1.02 mg/dL (ref 0.61–1.24)
GFR, Estimated: >60 mL/min (ref >60)
Glucose, Bld: 110 mg/dL — ABNORMAL HIGH (ref 70–99)
Potassium: 3.7 mmol/L (ref 3.5–5.1)
Sodium: 137 mmol/L (ref 135–145)
Total Bilirubin: 1.1 mg/dL (ref <1.2)
Total Protein: 6.9 g/dL (ref 6.5–8.1)

## 2023-10-01 LAB — PSA: Prostatic Specific Antigen: 0.01 ng/mL (ref 0.00–4.00)

## 2023-10-01 MED ORDER — LEUPROLIDE ACETATE (6 MONTH) 45 MG ~~LOC~~ KIT
45.0000 mg | PACK | Freq: Once | SUBCUTANEOUS | Status: AC
  Administered 2023-10-01: 45 mg via SUBCUTANEOUS
  Filled 2023-10-01: qty 45

## 2023-10-01 NOTE — Assessment & Plan Note (Signed)
Stable.  Monitor.  

## 2023-10-01 NOTE — Progress Notes (Signed)
Hematology/Oncology Progress note [phone] Fax:(336) [phone]      Chief Complaint: James Lucas is a 82 y.o. male with a long-standing history of prostate cancer who presents for follow up.   ASSESSMENT & PLAN:   Prostate cancer [facility]) # Recurrent prostate Cancer- diagnosed 2009 s/p cryotherapy and biochemical recurrence in 2010, s/p IMRT, 08/2020 biopsy proven persistent local recurrence, not a candidate for additional RT. Castration sensitive local recurrence.  Currently receiving Eligard every 6 months  Continue Abiateron 750 mg plus prednisone 5 mg daily.  PSA 0.01, stable. Today's level is pending.   He takes omeprazole 20mg  daily PRN heart burn.   Normocytic anemia Stable. Monitor   Essential hypertension currently on Triamterene -hydrochlorothiazide 37.5-25 mg daily.    Norvasc 2.5 mg if his SBP is persistently above 150 he may utilize low-dose Norvasc for BP control.  BP is stable and well controlled.  Androgen deprivation therapy Eligard 45mg  every 6 months, proceed today- next due May 2024.   Orders Placed This Encounter  Procedures   CBC with Differential (Cancer Center Only)    Standing Status:   Future    Standing Expiration Date:   09/30/2024   CMP (Cancer Center only)    Standing Status:   Future    Standing Expiration Date:   09/30/2024   PSA    Standing Status:   Future    Standing Expiration Date:   09/30/2024   Follow up in 3 months.  All questions were answered. The patient knows to call the clinic with any problems, questions or concerns.  Rickard Patience, MD, PhD [facility] Hematology Oncology 10/01/2023   PERTINENT ONCOLOGY HISTORY Patient previously followed up by Dr.Corcoran, patient switched care to me on 07/03/21 Extensive medical record review was performed by me Per note,  # 2009 prostate cancer .  Gleason score was 4+3 in 2009.  He received cryoablation. # 2010 PSA rose to 1.4, biochemical recurrence.  Prostate biopsy in  04/2009 revealed Gleason 5+4 adenocarcinoma.  He received IMRT with adjuvant androgen deprivation therapy.  PSA nadir was < 0.1.  05/23/2014  Prostate MRI revealed areas suspicious for extracapsular extension in the right posterior apex and along the posterior aspect of the anterior urethra.  ADT was discussed but due to history of side effects he elected to continue surveillance.  #2016 PSA increased and he was started on Lupron, intermittently secondary to side effects.  # 01/2016 PSA increased to 3.64 05/01/2016 CT revealed no evidence of recurrent or metastatic disease. Bone scan on 05/01/2016 showed no evidence of osseous metastatic disease # 07/10/2016 Seen by Dr.Finnegan, started on Leupron.  # 10/26/2016 Dr. Griffin Basil at [facility] recommended discontinuation of Lupron as his PSA was < 4.0. He was to pursue intermittent therapy when PSA increased to about 10.  Testosterone and PSA checks were recommended every 2 months and PET/CT scan with F-18 fluciclovine to assess for oligomenorrhea static disease if PSA began to rise.  12/02/2017 Axumin PET scan revealed potential local recurrence in the residual left prostate gland, with the region of activity relatively small at approximately 1.5 cm. No evidence of metastatic disease in the abdomen pelvis or skeleton 09/02/2018 by Dr. Orlie Dakin.  At that time, PSA was 5.98.  He agreed to reinitiate Lupron (received 09/12/2018).  07/02/2020 PET Axumin scan on  revealed intense radiotracer activity in the LEFT prostate gland concern for residual carcinoma.  There was no significant change from PET-CT 09/09/2018.  There was no nodal metastasis or skeletal  metastasis.  He is s/p RIGHT nephrectomy.  08/22/2020 revealed prostatic adenocarcinoma in 9/12 (six from the left side; 3 from the right side) biopsies.  The tumor showed mixed morphology with some areas showing ADT treatment effect and other areas without significant treatment effect.  Pattern was Gleason 4 and  5 in areas of less treatment effect.  The background prostate was fibrotic c/w radiation effect.   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  He undergoes surveillance cystoscopy by Dr. Raoul Pitch [facility]).   Last cystoscopy was normal on 01/12/2019.  on Eligard 45mg  Q6 months. Last received on 04/02/2021 07/10/2021, patient started on Abiateron 750 mg daily and prednisone 5 mg daily.  Interval History:  INTERVAL HISTORY James Lucas is a 82 y.o. male who has above history reviewed by me today presents for follow up visit for management of prostate cancer He missed his appt recently, and rescheduled to today.  He is on Abiaterone 750mg  daily and prednisone 5mg  daily, tolerates well.  Patient reports being compliant with medications.   Review of Systems  Constitutional:  Positive for fatigue. Negative for appetite change, chills, fever and unexpected weight change.  HENT:   Negative for hearing loss and voice change.   Eyes:  Negative for eye problems and icterus.  Respiratory:  Negative for chest tightness, cough and shortness of breath.   Cardiovascular:  Negative for chest pain and leg swelling.  Gastrointestinal:  Negative for abdominal distention and abdominal pain.  Endocrine: Positive for hot flashes.  Genitourinary:  Negative for difficulty urinating, dysuria and frequency.   Skin:  Negative for itching and rash.  Neurological:  Negative for light-headedness and numbness.  Hematological:  Negative for adenopathy. Does not bruise/bleed easily.  Psychiatric/Behavioral:  Negative for confusion.      Past Medical History:  Diagnosis Date   Allergic rhinitis    Bladder cancer (HCC)    BPH (benign prostatic hyperplasia)    Chronic back pain    Essential hypertension    Glaucoma    Impotence of organic origin    Liver laceration    Prostate cancer (HCC)    Stenosis, spinal, lumbar     Past Surgical  History:  Procedure Laterality Date   BACK SURGERY     COLONOSCOPY     COLONOSCOPY WITH PROPOFOL N/A 08/24/2019   Procedure: COLONOSCOPY WITH PROPOFOL;  Surgeon: Christena Deem, MD;  Location: [facility] ENDOSCOPY;  Service: Endoscopy;  Laterality: N/A;   HERNIA REPAIR     NEPHRECTOMY Right 1978   TRANSURETHRAL RESECTION OF BLADDER TUMOR WITH GYRUS (TURBT-GYRUS)      Family History  Problem Relation Age of Onset   Cancer Father     Social History:  reports that he quit smoking about 59 years ago. His smoking use included cigarettes. He has never used smokeless tobacco. He reports that he does not drink alcohol and does not use drugs.  Allergies:  Allergies  Allergen Reactions   Ace Inhibitors Cough    Current Medications: Current Outpatient Medications  Medication Sig Dispense Refill   abiraterone acetate (ZYTIGA) 250 MG tablet TAKE 3 TABLETS BY MOUTH DAILY. TAKE ON AN EMPTY STOMACH 1 HOUR BEFORE OR 2 HOURS AFTER A MEAL 90 tablet 3   albuterol (VENTOLIN HFA) 108 (90 Base) MCG/ACT inhaler Inhale 2 puffs into the lungs every 4 (four) hours as needed for wheezing or shortness of breath.     amLODipine (  NORVASC) 2.5 MG tablet Take 1 tablet (2.5 mg total) by mouth See admin instructions. Take 1 tablet if systolic BP is persistently above 150 90 tablet 1   aspirin EC 81 MG tablet Take 81 mg by mouth daily.      calcium-vitamin D (OSCAL WITH D) 500-5 MG-MCG tablet Take 1 tablet by mouth daily. 90 tablet 3   ferrous sulfate 325 (65 FE) MG tablet Take by mouth.     fluticasone (FLONASE) 50 MCG/ACT nasal spray Place 1 spray into both nostrils daily as needed.     latanoprost (XALATAN) 0.005 % ophthalmic solution Place 1 drop into both eyes at bedtime.      montelukast (SINGULAIR) 10 MG tablet Take 10 mg by mouth daily.      Multiple Vitamin (MULTI-VITAMIN) tablet Take 1 tablet by mouth daily.     omeprazole (PRILOSEC) 20 MG capsule Take 1 capsule (20 mg total) by mouth daily as needed. 90  capsule 2   predniSONE (DELTASONE) 5 MG tablet TAKE 1 TABLET DAILY WITH BREAKFAST 90 tablet 3   triamterene-hydrochlorothiazide (MAXZIDE-25) 37.5-25 MG tablet Take 1 tablet by mouth daily.     vitamin B-12 (CYANOCOBALAMIN) 1000 MCG tablet Take 1,000 mcg by mouth daily.      No current facility-administered medications for this visit.     Performance status (ECOG): 1  Vital Signs: Blood pressure 136/66, pulse 91, temperature (!) 97.4 F (36.3 C), temperature source Tympanic, resp. rate 18, weight 227 lb 4.8 oz (103.1 kg), SpO2 99%. Physical Exam Constitutional:      General: He is not in acute distress.    Appearance: He is not diaphoretic.  HENT:     Head: Normocephalic and atraumatic.  Eyes:     General: No scleral icterus. Cardiovascular:     Rate and Rhythm: Normal rate and regular rhythm.  Pulmonary:     Effort: Pulmonary effort is normal. No respiratory distress.     Breath sounds: Normal breath sounds.  Abdominal:     General: Bowel sounds are normal. There is no distension.     Palpations: Abdomen is soft.     Tenderness: There is no abdominal tenderness.  Musculoskeletal:        General: Normal range of motion.     Cervical back: Normal range of motion and neck supple.  Skin:    General: Skin is warm and dry.     Findings: No erythema.  Neurological:     Mental Status: He is alert and oriented to person, place, and time.     Cranial Nerves: No cranial nerve deficit.     Motor: No abnormal muscle tone.     Coordination: Coordination normal.  Psychiatric:        Mood and Affect: Mood and affect normal.    Labs    Latest Ref Rng & Units 10/01/2023    9:45 AM 06/23/2023   12:41 PM 05/26/2023   12:49 PM  CBC  WBC 4.0 - 10.5 K/uL 8.6  11.2  11.7   Hemoglobin 13.0 - 17.0 g/dL 78.2  95.6  21.3   Hematocrit 39.0 - 52.0 % 39.3  40.0  38.4   Platelets 150 - 400 K/uL 224  194  205       Latest Ref Rng & Units 10/01/2023    9:45 AM 06/23/2023   12:41 PM 05/26/2023    12:49 PM  CMP  Glucose 70 - 99 mg/dL 086  98  578   BUN 8 -  23 mg/dL 10  11  14    Creatinine 0.61 - 1.24 mg/dL 6.94  8.54  6.27   Sodium 135 - 145 mmol/L 137  134  140   Potassium 3.5 - 5.1 mmol/L 3.7  3.8  4.4   Chloride 98 - 111 mmol/L 98  99  98   CO2 22 - 32 mmol/L 29  26  30    Calcium 8.9 - 10.3 mg/dL 9.3  9.3  9.4   Total Protein 6.5 - 8.1 g/dL 6.9  7.1  6.8   Total Bilirubin <1.2 mg/dL 1.1  0.6  0.8   Alkaline Phos 38 - 126 U/L 44  52  48   AST 15 - 41 U/L 21  27  30    ALT 0 - 44 U/L 16  25  27

## 2023-10-01 NOTE — Assessment & Plan Note (Signed)
currently on Triamterene -hydrochlorothiazide 37.5-25 mg daily.    Norvasc 2.5 mg if his SBP is persistently above 150 he may utilize low-dose Norvasc for BP control.  BP is stable and well controlled.

## 2023-10-01 NOTE — Assessment & Plan Note (Addendum)
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 Castration sensitive local recurrence.  Currently receiving Eligard every 6 months  Continue Abiateron 750 mg plus prednisone 5 mg daily.  PSA 0.01, stable. Today's level is pending.   He takes omeprazole 20mg  daily PRN heart burn.

## 2023-10-01 NOTE — Assessment & Plan Note (Signed)
Eligard 45mg  every 6 months, proceed today- next due May 2024.

## 2023-10-05 ENCOUNTER — Other Ambulatory Visit: Payer: Self-pay

## 2023-10-05 NOTE — Progress Notes (Signed)
Specialty Pharmacy Refill Coordination Note  James Lucas is a 82 y.o. male contacted today regarding refills of specialty medication(s) Abiraterone Acetate   Patient requested Delivery   Delivery date: 10/12/23   Verified address: [address]   [place] [address]   Medication will be filled on 10/11/23.

## 2023-11-03 ENCOUNTER — Other Ambulatory Visit: Payer: Self-pay

## 2023-11-03 ENCOUNTER — Other Ambulatory Visit: Payer: Self-pay | Admitting: Pharmacist

## 2023-11-03 DIAGNOSIS — C61 Malignant neoplasm of prostate: Secondary | ICD-10-CM

## 2023-11-03 NOTE — Progress Notes (Signed)
Specialty Pharmacy Refill Coordination Note  [name] is a 82 y.o. male contacted today regarding refills of specialty medication(s) Abiraterone Acetate [name])   Patient requested Delivery   Delivery date: 11/08/23   Verified address: [address]   [place] [address]   Medication will be filled on 11/05/23, pending refill approval.

## 2023-11-04 ENCOUNTER — Other Ambulatory Visit: Payer: Self-pay

## 2023-11-04 ENCOUNTER — Other Ambulatory Visit (HOSPITAL_COMMUNITY): Payer: Self-pay

## 2023-11-04 MED ORDER — ABIRATERONE ACETATE 250 MG PO TABS
ORAL_TABLET | ORAL | 3 refills | Status: DC
  Filled 2023-11-04: qty 90, 30d supply, fill #0
  Filled 2023-11-30: qty 90, 30d supply, fill #1
  Filled 2023-12-28: qty 90, 30d supply, fill #2
  Filled 2024-02-17: qty 90, 30d supply, fill #3

## 2023-11-05 ENCOUNTER — Other Ambulatory Visit: Payer: Self-pay

## 2023-11-21 ENCOUNTER — Telehealth: Payer: Self-pay

## 2023-11-21 ENCOUNTER — Encounter: Payer: Self-pay | Admitting: Oncology

## 2023-11-21 ENCOUNTER — Other Ambulatory Visit (HOSPITAL_COMMUNITY): Payer: Self-pay

## 2023-11-21 NOTE — Telephone Encounter (Signed)
 Oral Oncology Patient Advocate Encounter  Was successful in securing patient a $8,000.00 grant from [facility] to provide copayment coverage for Abiraterone .  This will keep the out of pocket expense at $0.     [employer] ID: [id number]   The billing information is as follows and has been shared with [facility].    RxBin: [id number] PCN: [id number] Member ID: [id number] Group ID: [id number] Dates of Eligibility: 10/21/23 through 10/19/24  Fund:  Prostate Cancer - Medicare Access   Morene Potters, CPhT Oncology Pharmacy Patient Advocate  [facility]  [phone] (phone) [phone] (fax)

## 2023-11-23 ENCOUNTER — Other Ambulatory Visit (HOSPITAL_COMMUNITY): Payer: Self-pay

## 2023-11-26 ENCOUNTER — Other Ambulatory Visit: Payer: Self-pay

## 2023-11-29 ENCOUNTER — Other Ambulatory Visit: Payer: Self-pay

## 2023-11-30 ENCOUNTER — Other Ambulatory Visit: Payer: Self-pay

## 2023-11-30 NOTE — Progress Notes (Signed)
 Specialty Pharmacy Refill Coordination Note  James Lucas is a 83 y.o. male contacted today regarding refills of specialty medication(s) Abiraterone  Acetate (ZYTIGA )   Patient requested (Patient-Rptd) Delivery   Delivery date: (Patient-Rptd) 12/07/23   Verified address: (Patient-Rptd) [address], [place], [place]. [address]   Medication will be filled on 12/06/23.

## 2023-12-03 ENCOUNTER — Other Ambulatory Visit: Payer: Self-pay

## 2023-12-06 ENCOUNTER — Other Ambulatory Visit: Payer: Self-pay

## 2023-12-23 ENCOUNTER — Other Ambulatory Visit (HOSPITAL_COMMUNITY): Payer: Self-pay

## 2023-12-23 ENCOUNTER — Other Ambulatory Visit: Payer: Self-pay

## 2023-12-28 ENCOUNTER — Other Ambulatory Visit (HOSPITAL_COMMUNITY): Payer: Self-pay

## 2023-12-28 NOTE — Progress Notes (Signed)
Specialty Pharmacy Refill Coordination Note  James Lucas is a 83 y.o. male contacted today regarding refills of specialty medication(s) Abiraterone Acetate [name])   Patient requested Delivery   Delivery date: 12/31/23   Verified address: [address]   [place] [address]   Medication will be filled on 12/30/23.

## 2023-12-28 NOTE — Progress Notes (Signed)
Specialty Pharmacy Ongoing Clinical Assessment Note  James Lucas is a 83 y.o. male who is being followed by the specialty pharmacy service for RxSp Oncology   Patient's specialty medication(s) reviewed today: Abiraterone Acetate (ZYTIGA)   Missed doses in the last 4 weeks: 0   Patient/Caregiver did not have any additional questions or concerns.   Therapeutic benefit summary: Patient is achieving benefit   Adverse events/side effects summary: Experienced adverse events/side effects (some mild abdominal pain that comes and goes, tolerable)   Patient's therapy is appropriate to: Continue    Goals Addressed             This Visit's Progress    Slow Disease Progression       Patient is on track. Patient will maintain adherence.  Patient's last PSA was 0.01 ng/ml (10/01/23).          Follow up:  6 months  Servando Snare Specialty Pharmacist

## 2023-12-29 ENCOUNTER — Other Ambulatory Visit: Payer: Self-pay | Admitting: Oncology

## 2023-12-30 ENCOUNTER — Other Ambulatory Visit: Payer: Self-pay

## 2024-01-07 ENCOUNTER — Inpatient Hospital Stay (HOSPITAL_BASED_OUTPATIENT_CLINIC_OR_DEPARTMENT_OTHER): Payer: Medicare Other | Admitting: Oncology

## 2024-01-07 ENCOUNTER — Inpatient Hospital Stay: Payer: Medicare Other | Attending: Oncology

## 2024-01-07 ENCOUNTER — Encounter: Payer: Self-pay | Admitting: Oncology

## 2024-01-07 VITALS — BP 140/81 | HR 89 | Temp 97.3°F | Resp 18 | Wt 223.6 lb

## 2024-01-07 DIAGNOSIS — D649 Anemia, unspecified: Secondary | ICD-10-CM | POA: Diagnosis not present

## 2024-01-07 DIAGNOSIS — I10 Essential (primary) hypertension: Secondary | ICD-10-CM | POA: Insufficient documentation

## 2024-01-07 DIAGNOSIS — Z79899 Other long term (current) drug therapy: Secondary | ICD-10-CM | POA: Insufficient documentation

## 2024-01-07 DIAGNOSIS — Z87891 Personal history of nicotine dependence: Secondary | ICD-10-CM | POA: Insufficient documentation

## 2024-01-07 DIAGNOSIS — C61 Malignant neoplasm of prostate: Secondary | ICD-10-CM | POA: Insufficient documentation

## 2024-01-07 DIAGNOSIS — Z7982 Long term (current) use of aspirin: Secondary | ICD-10-CM | POA: Diagnosis not present

## 2024-01-07 LAB — CBC WITH DIFFERENTIAL (CANCER CENTER ONLY)
Abs Immature Granulocytes: 0.06 10*3/uL (ref 0.00–0.07)
Basophils Absolute: 0.1 10*3/uL (ref 0.0–0.1)
Basophils Relative: 1 %
Eosinophils Absolute: 0.6 10*3/uL — ABNORMAL HIGH (ref 0.0–0.5)
Eosinophils Relative: 6 %
HCT: 37.1 % — ABNORMAL LOW (ref 39.0–52.0)
Hemoglobin: 12.2 g/dL — ABNORMAL LOW (ref 13.0–17.0)
Immature Granulocytes: 1 %
Lymphocytes Relative: 30 %
Lymphs Abs: 2.9 10*3/uL (ref 0.7–4.0)
MCH: 32.2 pg (ref 26.0–34.0)
MCHC: 32.9 g/dL (ref 30.0–36.0)
MCV: 97.9 fL (ref 80.0–100.0)
Monocytes Absolute: 0.6 10*3/uL (ref 0.1–1.0)
Monocytes Relative: 6 %
Neutro Abs: 5.4 10*3/uL (ref 1.7–7.7)
Neutrophils Relative %: 56 %
Platelet Count: 202 10*3/uL (ref 150–400)
RBC: 3.79 MIL/uL — ABNORMAL LOW (ref 4.22–5.81)
RDW: 12.1 % (ref 11.5–15.5)
WBC Count: 9.7 10*3/uL (ref 4.0–10.5)
nRBC: 0 % (ref 0.0–0.2)

## 2024-01-07 LAB — CMP (CANCER CENTER ONLY)
ALT: 14 U/L (ref 0–44)
AST: 20 U/L (ref 15–41)
Albumin: 3.9 g/dL (ref 3.5–5.0)
Alkaline Phosphatase: 43 U/L (ref 38–126)
Anion gap: 8 (ref 5–15)
BUN: 14 mg/dL (ref 8–23)
CO2: 31 mmol/L (ref 22–32)
Calcium: 9.4 mg/dL (ref 8.9–10.3)
Chloride: 103 mmol/L (ref 98–111)
Creatinine: 1.06 mg/dL (ref 0.61–1.24)
GFR, Estimated: >60 mL/min (ref >60)
Glucose, Bld: 119 mg/dL — ABNORMAL HIGH (ref 70–99)
Potassium: 4.3 mmol/L (ref 3.5–5.1)
Sodium: 142 mmol/L (ref 135–145)
Total Bilirubin: 0.8 mg/dL (ref 0.0–1.2)
Total Protein: 6.3 g/dL — ABNORMAL LOW (ref 6.5–8.1)

## 2024-01-07 NOTE — Assessment & Plan Note (Signed)
 Stable.  Monitor.

## 2024-01-07 NOTE — Assessment & Plan Note (Signed)
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 Castration sensitive local recurrence.  Currently receiving Eligard every 6 months  Continue Abiateron 750 mg plus prednisone 5 mg daily.  PSA 0.01, stable. Today's level is pending.   He takes omeprazole 20mg  daily PRN heart burn.

## 2024-01-07 NOTE — Progress Notes (Signed)
Hematology/Oncology Progress note [phone] [phone]      Chief Complaint: James Lucas is a 83 y.o. male with a long-standing history of prostate cancer who presents for follow up.   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 Castration sensitive local recurrence.  Currently receiving Eligard every 6 months  Continue Abiateron 750 mg plus prednisone 5 mg daily.  PSA 0.01, stable. Today's level is pending.   He takes omeprazole 20mg  daily PRN heart burn.   Essential hypertension currently on Triamterene -hydrochlorothiazide 37.5-25 mg daily.    Norvasc 2.5 mg BP is stable and well controlled.  Normocytic anemia Stable. Monitor   Orders Placed This Encounter  Procedures   CMP (Cancer Center only)    Standing Status:   Future    Expected Date:   03/30/2024    Expiration Date:   01/06/2025   CBC with Differential (Cancer Center Only)    Standing Status:   Future    Expected Date:   03/30/2024    Expiration Date:   01/06/2025   PSA    Standing Status:   Future    Expected Date:   03/30/2024    Expiration Date:   01/06/2025   Follow up in 3 months.  All questions were answered. The patient knows to call the clinic with any problems, questions or concerns.  Rickard Patience, MD, PhD [facility] Hematology Oncology 01/07/2024   PERTINENT ONCOLOGY HISTORY Patient previously followed up by Dr.Corcoran, patient switched care to me on 07/03/21 Extensive medical record review was performed by me Per note,  # 2009 prostate cancer .  Gleason score was 4+3 in 2009.  He received cryoablation. # 2010 PSA rose to 1.4, biochemical recurrence.  Prostate biopsy in 04/2009 revealed Gleason 5+4 adenocarcinoma.  He received IMRT with adjuvant androgen deprivation therapy.  PSA nadir  was < 0.1.  05/23/2014  Prostate MRI revealed areas suspicious for extracapsular extension in the right posterior apex and along the posterior aspect of the anterior urethra.  ADT was discussed but due to history of side effects he elected to continue surveillance.  #2016 PSA increased and he was started on Lupron, intermittently secondary to side effects.  # 01/2016 PSA increased to 3.64 05/01/2016 CT revealed no evidence of recurrent or metastatic disease. Bone scan on 05/01/2016 showed no evidence of osseous metastatic disease # 07/10/2016 Seen by Dr.Finnegan, started on Leupron.  # 10/26/2016 Dr. Griffin Basil at [facility] recommended discontinuation of Lupron as his PSA was < 4.0. He was to pursue intermittent therapy when PSA increased to about 10.  Testosterone and PSA checks were recommended every 2 months and PET/CT scan with F-18 fluciclovine to assess for oligomenorrhea static disease if PSA began to rise.  12/02/2017 Axumin PET scan revealed potential local recurrence in the residual left prostate gland, with the region of activity relatively small at approximately 1.5 cm. No evidence of metastatic disease in the abdomen pelvis or skeleton 09/02/2018 by Dr. Orlie Dakin.  At that time, PSA was 5.98.  He agreed to reinitiate Lupron (received 09/12/2018).  07/02/2020 PET Axumin scan on  revealed intense radiotracer activity in the LEFT prostate gland concern for residual carcinoma.  There was no significant change from PET-CT 09/09/2018.  There was no nodal metastasis or skeletal metastasis.  He is s/p RIGHT nephrectomy.  08/22/2020 revealed prostatic  adenocarcinoma in 9/12 (six from the left side; 3 from the right side) biopsies.  The tumor showed mixed morphology with some areas showing ADT treatment effect and other areas without significant treatment effect.  Pattern was Gleason 4 and 5 in areas of less treatment effect.  The background prostate was fibrotic c/w radiation effect.   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  He undergoes surveillance cystoscopy by Dr. Raoul P[facility]).   Last cystoscopy was normal on 01/12/2019.  on Eligard 45mg  Q6 months. Last received on 04/02/2021 07/10/2021, patient started on Abiateron 750 mg daily and prednisone 5 mg daily.  Interval History:  INTERVAL HISTORY James Lucas is a 83 y.o. male who has above history reviewed by me today presents for follow up visit for management of prostate cancer He missed his appt recently, and rescheduled to today.  He is on Abiaterone 750mg  daily and prednisone 5mg  daily, tolerates well.  Patient reports being compliant with medications.   Review of Systems  Constitutional:  Positive for fatigue. Negative for appetite change, chills, fever and unexpected weight change.  HENT:   Negative for hearing loss and voice change.   Eyes:  Negative for eye problems and icterus.  Respiratory:  Negative for chest tightness, cough and shortness of breath.   Cardiovascular:  Negative for chest pain and leg swelling.  Gastrointestinal:  Negative for abdominal distention and abdominal pain.  Endocrine: Positive for hot flashes.  Genitourinary:  Negative for difficulty urinating, dysuria and frequency.   Skin:  Negative for itching and rash.  Neurological:  Negative for light-headedness and numbness.  Hematological:  Negative for adenopathy. Does not bruise/bleed easily.  Psychiatric/Behavioral:  Negative for confusion.      Past Medical History:  Diagnosis Date   Allergic rhinitis    Bladder cancer (HCC)    BPH (benign prostatic hyperplasia)    Chronic back pain    Essential hypertension    Glaucoma    Impotence of organic origin    Liver laceration    Prostate cancer (HCC)    Stenosis, spinal, lumbar     Past Surgical History:  Procedure Laterality Date   BACK SURGERY     COLONOSCOPY     COLONOSCOPY WITH PROPOFOL N/A 08/24/2019    Procedure: COLONOSCOPY WITH PROPOFOL;  Surgeon: Christena Deem, MD;  Location: [facility] ENDOSCOPY;  Service: Endoscopy;  Laterality: N/A;   HERNIA REPAIR     NEPHRECTOMY Right 1978   TRANSURETHRAL RESECTION OF BLADDER TUMOR WITH GYRUS (TURBT-GYRUS)      Family History  Problem Relation Age of Onset   Cancer Father     Social History:  reports that he quit smoking about 60 years ago. His smoking use included cigarettes. He has never used smokeless tobacco. He reports that he does not drink alcohol and does not use drugs.  Allergies:  Allergies  Allergen Reactions   Ace Inhibitors Cough    Current Medications: Current Outpatient Medications  Medication Sig Dispense Refill   abiraterone acetate (ZYTIGA) 250 MG tablet TAKE 3 TABLETS BY MOUTH DAILY. TAKE ON AN EMPTY STOMACH 1 HOUR BEFORE OR 2 HOURS AFTER A MEAL 90 tablet 3   albuterol (VENTOLIN HFA) 108 (90 Base) MCG/ACT inhaler Inhale 2 puffs into the lungs every 4 (four) hours as needed for wheezing or shortness of breath.     amLODipine (NORVASC) 2.5 MG tablet Take 1 tablet (2.5 mg total) by  mouth See admin instructions. Take 1 tablet if systolic BP is persistently above 150 90 tablet 1   aspirin EC 81 MG tablet Take 81 mg by mouth daily.      calcium-vitamin D (OSCAL WITH D) 500-5 MG-MCG tablet Take 1 tablet by mouth daily. 90 tablet 3   ferrous sulfate 325 (65 FE) MG tablet Take by mouth.     fluticasone (FLONASE) 50 MCG/ACT nasal spray Place 1 spray into both nostrils daily as needed.     latanoprost (XALATAN) 0.005 % ophthalmic solution Place 1 drop into both eyes at bedtime.      montelukast (SINGULAIR) 10 MG tablet Take 10 mg by mouth daily.      Multiple Vitamin (MULTI-VITAMIN) tablet Take 1 tablet by mouth daily.     omeprazole (PRILOSEC) 20 MG capsule Take 1 capsule (20 mg total) by mouth daily as needed. 90 capsule 2   predniSONE (DELTASONE) 5 MG tablet TAKE 1 TABLET DAILY WITH BREAKFAST 90 tablet 3    triamterene-hydrochlorothiazide (MAXZIDE-25) 37.5-25 MG tablet Take 1 tablet by mouth daily.     vitamin B-12 (CYANOCOBALAMIN) 1000 MCG tablet Take 1,000 mcg by mouth daily.      No current facility-administered medications for this visit.     Performance status (ECOG): 1  Vital Signs: Blood pressure (!) 140/81, pulse 89, temperature (!) 97.3 F (36.3 C), resp. rate 18, weight 223 lb 9.6 oz (101.4 kg), SpO2 99%. Physical Exam Constitutional:      General: He is not in acute distress.    Appearance: He is not diaphoretic.  HENT:     Head: Normocephalic and atraumatic.  Eyes:     General: No scleral icterus. Cardiovascular:     Rate and Rhythm: Normal rate and regular rhythm.  Pulmonary:     Effort: Pulmonary effort is normal. No respiratory distress.     Breath sounds: Normal breath sounds.  Abdominal:     General: Bowel sounds are normal. There is no distension.     Palpations: Abdomen is soft.     Tenderness: There is no abdominal tenderness.  Musculoskeletal:        General: Normal range of motion.     Cervical back: Normal range of motion and neck supple.  Skin:    General: Skin is warm and dry.     Findings: No erythema.  Neurological:     Mental Status: He is alert and oriented to person, place, and time.     Cranial Nerves: No cranial nerve deficit.     Motor: No abnormal muscle tone.     Coordination: Coordination normal.  Psychiatric:        Mood and Affect: Mood and affect normal.    Labs    Latest Ref Rng & Units 01/07/2024    9:45 AM 10/01/2023    9:45 AM 06/23/2023   12:41 PM  CBC  WBC 4.0 - 10.5 K/uL 9.7  8.6  11.2   Hemoglobin 13.0 - 17.0 g/dL 16.1  09.6  04.5   Hematocrit 39.0 - 52.0 % 37.1  39.3  40.0   Platelets 150 - 400 K/uL 202  224  194       Latest Ref Rng & Units 01/07/2024    9:45 AM 10/01/2023    9:45 AM 06/23/2023   12:41 PM  CMP  Glucose 70 - 99 mg/dL 409  811  98   BUN 8 - 23 mg/dL 14  10  11    Creatinine 0.61 -  1.24 mg/dL 5.78   4.69  6.29   Sodium 135 - 145 mmol/L 142  137  134   Potassium 3.5 - 5.1 mmol/L 4.3  3.7  3.8   Chloride 98 - 111 mmol/L 103  98  99   CO2 22 - 32 mmol/L 31  29  26    Calcium 8.9 - 10.3 mg/dL 9.4  9.3  9.3   Total Protein 6.5 - 8.1 g/dL 6.3  6.9  7.1   Total Bilirubin 0.0 - 1.2 mg/dL 0.8  1.1  0.6   Alkaline Phos 38 - 126 U/L 43  44  52   AST 15 - 41 U/L 20  21  27    ALT 0 - 44 U/L 14  16  25

## 2024-01-07 NOTE — Assessment & Plan Note (Signed)
currently on Triamterene -hydrochlorothiazide 37.5-25 mg daily.    Norvasc 2.5 mg BP is stable and well controlled.

## 2024-01-08 LAB — PSA: Prostatic Specific Antigen: 0.01 ng/mL (ref 0.00–4.00)

## 2024-01-20 ENCOUNTER — Other Ambulatory Visit: Payer: Self-pay | Admitting: *Deleted

## 2024-01-20 DIAGNOSIS — C61 Malignant neoplasm of prostate: Secondary | ICD-10-CM

## 2024-01-27 ENCOUNTER — Inpatient Hospital Stay: Payer: Medicare Other | Attending: Oncology

## 2024-01-27 DIAGNOSIS — C61 Malignant neoplasm of prostate: Secondary | ICD-10-CM | POA: Insufficient documentation

## 2024-01-28 LAB — PSA: Prostatic Specific Antigen: 0.01 ng/mL (ref 0.00–4.00)

## 2024-02-03 ENCOUNTER — Ambulatory Visit
Admission: RE | Admit: 2024-02-03 | Discharge: 2024-02-03 | Disposition: A | Discharge status: Home / Self Care | Payer: Medicare Other | Source: Ambulatory Visit | Attending: Radiation Oncology | Admitting: Radiation Oncology

## 2024-02-03 ENCOUNTER — Encounter: Payer: Self-pay | Admitting: Radiation Oncology

## 2024-02-03 VITALS — BP 137/79 | HR 78 | Temp 98.7°F | Resp 16 | Wt 221.0 lb

## 2024-02-03 DIAGNOSIS — Z923 Personal history of irradiation: Secondary | ICD-10-CM | POA: Insufficient documentation

## 2024-02-03 DIAGNOSIS — C61 Malignant neoplasm of prostate: Secondary | ICD-10-CM | POA: Diagnosis present

## 2024-02-03 NOTE — Progress Notes (Signed)
 Radiation Oncology Follow up Note  Name: Carole Deere   Date:   02/03/2024 MRN:  [id number] DOB: 24-Nov-1940    This 83 y.o. male presents to the clinic today for 3 and half year follow-up status post IMRT radiation therapy and a salvage mode for patient 12 years out status post cryotherapy for Gleason 9 adenocarcinoma the prostate.  REFERRING PROVIDER: Myrene Buddy, *  HPI: Patient is an 83 year old male now out over 3-1/2 years having completed salvage radiation therapy after cryotherapy for Gleason 9 adenocarcinoma.  Seen today in routine follow-up he is doing well specifically denies any increased lower urinary tract symptoms diarrhea or fatigue.Marland Kitchen  He is currently receiving Eligard every 6 months.  Under medical oncology's direction.  His PSA remains undetected most recently at 0.01  COMPLICATIONS OF TREATMENT: none  FOLLOW UP COMPLIANCE: keeps appointments   PHYSICAL EXAM:  BP 137/79   Pulse 78   Temp 98.7 F (37.1 C) (Tympanic)   Resp 16   Wt 221 lb (100.2 kg)   BMI 29.16 kg/m  Well-developed well-nourished patient in NAD. HEENT reveals PERLA, EOMI, discs not visualized.  Oral cavity is clear. No oral mucosal lesions are identified. Neck is clear without evidence of cervical or supraclavicular adenopathy. Lungs are clear to A&P. Cardiac examination is essentially unremarkable with regular rate and rhythm without murmur rub or thrill. Abdomen is benign with no organomegaly or masses noted. Motor sensory and DTR levels are equal and symmetric in the upper and lower extremities. Cranial nerves II through XII are grossly intact. Proprioception is intact. No peripheral adenopathy or edema is identified. No motor or sensory levels are noted. Crude visual fields are within normal range.  RADIOLOGY RESULTS: No current films for review  PLAN: Present time patient is under excellent biochemical control of his prostate cancer I will turn follow-up care over to medical oncology and  Dr. Cathie Hoops.  I would be happy to reevaluate the patient in time the future should that be indicated.  Patient knows to call with any concerns.  I would like to take this opportunity to thank you for allowing me to participate in the care of your patient.Carmina Miller, MD

## 2024-02-15 ENCOUNTER — Other Ambulatory Visit: Payer: Self-pay

## 2024-02-17 ENCOUNTER — Other Ambulatory Visit: Payer: Self-pay

## 2024-02-17 ENCOUNTER — Other Ambulatory Visit: Payer: Self-pay | Admitting: Pharmacy Technician

## 2024-02-17 NOTE — Progress Notes (Signed)
 Specialty Pharmacy Refill Coordination Note  James Lucas is a 83 y.o. male contacted today regarding refills of specialty medication(s) Abiraterone Acetate [name])   Patient requested Delivery   Delivery date: 02/21/24   Verified address: [address]  [place]   Medication will be filled on 02/20/24.

## 2024-02-18 ENCOUNTER — Other Ambulatory Visit: Payer: Self-pay

## 2024-03-13 ENCOUNTER — Other Ambulatory Visit: Payer: Self-pay

## 2024-03-14 ENCOUNTER — Other Ambulatory Visit: Payer: Self-pay | Admitting: Pharmacist

## 2024-03-14 ENCOUNTER — Other Ambulatory Visit: Payer: Self-pay | Admitting: Pharmacy Technician

## 2024-03-14 ENCOUNTER — Other Ambulatory Visit: Payer: Self-pay

## 2024-03-14 DIAGNOSIS — C61 Malignant neoplasm of prostate: Secondary | ICD-10-CM

## 2024-03-14 NOTE — Progress Notes (Signed)
 Specialty Pharmacy Refill Coordination Note  James Lucas is a 83 y.o. male contacted today regarding refills of specialty medication(s) Abiraterone  Acetate (ZYTIGA )   Patient requested (Patient-Rptd) Delivery   Delivery date: (Patient-Rptd) 03/17/24   Verified address: (Patient-Rptd) [address] [place], [place]. [address]   Medication will be filled on 03/16/24.

## 2024-03-15 ENCOUNTER — Other Ambulatory Visit: Payer: Self-pay

## 2024-03-15 ENCOUNTER — Other Ambulatory Visit (HOSPITAL_COMMUNITY): Payer: Self-pay

## 2024-03-15 MED ORDER — ABIRATERONE ACETATE 250 MG PO TABS
ORAL_TABLET | ORAL | 3 refills | Status: DC
  Filled 2024-03-15 (×2): qty 90, 30d supply, fill #0
  Filled 2024-04-05: qty 90, 30d supply, fill #1
  Filled 2024-05-09: qty 90, 30d supply, fill #2
  Filled 2024-06-09: qty 90, 30d supply, fill #3

## 2024-03-16 ENCOUNTER — Other Ambulatory Visit: Payer: Self-pay

## 2024-03-30 ENCOUNTER — Inpatient Hospital Stay: Payer: Medicare Other

## 2024-03-30 ENCOUNTER — Inpatient Hospital Stay: Payer: Medicare Other | Attending: Oncology

## 2024-03-30 ENCOUNTER — Encounter: Payer: Self-pay | Admitting: Oncology

## 2024-03-30 ENCOUNTER — Inpatient Hospital Stay (HOSPITAL_BASED_OUTPATIENT_CLINIC_OR_DEPARTMENT_OTHER): Payer: Medicare Other | Admitting: Oncology

## 2024-03-30 VITALS — BP 143/72 | HR 72 | Temp 98.4°F | Resp 18 | Wt 224.6 lb

## 2024-03-30 DIAGNOSIS — C61 Malignant neoplasm of prostate: Secondary | ICD-10-CM | POA: Insufficient documentation

## 2024-03-30 DIAGNOSIS — Z7982 Long term (current) use of aspirin: Secondary | ICD-10-CM | POA: Diagnosis not present

## 2024-03-30 DIAGNOSIS — Z7952 Long term (current) use of systemic steroids: Secondary | ICD-10-CM | POA: Insufficient documentation

## 2024-03-30 DIAGNOSIS — D649 Anemia, unspecified: Secondary | ICD-10-CM

## 2024-03-30 DIAGNOSIS — Z87891 Personal history of nicotine dependence: Secondary | ICD-10-CM | POA: Diagnosis not present

## 2024-03-30 DIAGNOSIS — Z8551 Personal history of malignant neoplasm of bladder: Secondary | ICD-10-CM | POA: Diagnosis not present

## 2024-03-30 DIAGNOSIS — Z79899 Other long term (current) drug therapy: Secondary | ICD-10-CM | POA: Diagnosis not present

## 2024-03-30 DIAGNOSIS — I10 Essential (primary) hypertension: Secondary | ICD-10-CM | POA: Diagnosis not present

## 2024-03-30 LAB — CMP (CANCER CENTER ONLY)
ALT: 14 U/L (ref 0–44)
AST: 22 U/L (ref 15–41)
Albumin: 3.9 g/dL (ref 3.5–5.0)
Alkaline Phosphatase: 49 U/L (ref 38–126)
Anion gap: 11 (ref 5–15)
BUN: 15 mg/dL (ref 8–23)
CO2: 29 mmol/L (ref 22–32)
Calcium: 9.1 mg/dL (ref 8.9–10.3)
Chloride: 100 mmol/L (ref 98–111)
Creatinine: 0.94 mg/dL (ref 0.61–1.24)
GFR, Estimated: >60 mL/min (ref >60)
Glucose, Bld: 103 mg/dL — ABNORMAL HIGH (ref 70–99)
Potassium: 4.9 mmol/L (ref 3.5–5.1)
Sodium: 140 mmol/L (ref 135–145)
Total Bilirubin: 0.7 mg/dL (ref 0.0–1.2)
Total Protein: 6.5 g/dL (ref 6.5–8.1)

## 2024-03-30 LAB — CBC WITH DIFFERENTIAL (CANCER CENTER ONLY)
Abs Immature Granulocytes: 0.05 10*3/uL (ref 0.00–0.07)
Basophils Absolute: 0.1 10*3/uL (ref 0.0–0.1)
Basophils Relative: 1 %
Eosinophils Absolute: 0.4 10*3/uL (ref 0.0–0.5)
Eosinophils Relative: 4 %
HCT: 37.5 % — ABNORMAL LOW (ref 39.0–52.0)
Hemoglobin: 12.3 g/dL — ABNORMAL LOW (ref 13.0–17.0)
Immature Granulocytes: 1 %
Lymphocytes Relative: 19 %
Lymphs Abs: 1.9 10*3/uL (ref 0.7–4.0)
MCH: 32.1 pg (ref 26.0–34.0)
MCHC: 32.8 g/dL (ref 30.0–36.0)
MCV: 97.9 fL (ref 80.0–100.0)
Monocytes Absolute: 0.5 10*3/uL (ref 0.1–1.0)
Monocytes Relative: 6 %
Neutro Abs: 6.7 10*3/uL (ref 1.7–7.7)
Neutrophils Relative %: 69 %
Platelet Count: 196 10*3/uL (ref 150–400)
RBC: 3.83 MIL/uL — ABNORMAL LOW (ref 4.22–5.81)
RDW: 12 % (ref 11.5–15.5)
WBC Count: 9.7 10*3/uL (ref 4.0–10.5)
nRBC: 0 % (ref 0.0–0.2)

## 2024-03-30 LAB — PSA: Prostatic Specific Antigen: 0.02 ng/mL (ref 0.00–4.00)

## 2024-03-30 MED ORDER — LEUPROLIDE ACETATE (6 MONTH) 45 MG ~~LOC~~ KIT
45.0000 mg | PACK | Freq: Once | SUBCUTANEOUS | Status: AC
  Administered 2024-03-30: 45 mg via SUBCUTANEOUS
  Filled 2024-03-30: qty 45

## 2024-03-30 NOTE — Progress Notes (Signed)
 Hematology/Oncology Progress note [phone] [phone]      Chief Complaint: James Lucas is a 83 y.o. male with a long-standing history of prostate cancer who presents for follow up.   ASSESSMENT & PLAN:   Prostate cancer [facility]) # Recurrent prostate Cancer- diagnosed 2009 s/p cryotherapy and biochemical recurrence in 2010, s/p IMRT, 08/2020 biopsy proven persistent local recurrence, not a candidate for additional RT. Castration sensitive local recurrence.  Currently receiving Eligard  every 6 months  Continue Abiateron 750 mg plus prednisone  5 mg daily.  PSA 0.01, stable. Today's level is pending.   He takes omeprazole  20mg  daily PRN heart burn.   Androgen deprivation therapy Eligard  45mg  every 6 months, proceed today- next due Nov 2025  Normocytic anemia Stable. Monitor   Orders Placed This Encounter  Procedures   CBC with Differential (Cancer Center Only)    Standing Status:   Future    Expected Date:   06/30/2024    Expiration Date:   03/30/2025   CMP (Cancer Center only)    Standing Status:   Future    Expected Date:   06/30/2024    Expiration Date:   03/30/2025   PSA    Standing Status:   Future    Expected Date:   06/30/2024    Expiration Date:   03/30/2025   Follow up in 3 months.  All questions were answered. The patient knows to call the clinic with any problems, questions or concerns.  James Forbes, MD, PhD [facility] Hematology Oncology 03/30/2024   PERTINENT ONCOLOGY HISTORY Patient previously followed up by Dr.Corcoran, patient switched care to me on 07/03/21 Extensive medical record review was performed by me Per note,  # 2009 prostate cancer .  Gleason score was 4+3 in 2009.  He received cryoablation. # 2010 PSA rose to 1.4, biochemical recurrence.  Prostate biopsy in 04/2009 revealed Gleason 5+4 adenocarcinoma.  He received IMRT with adjuvant androgen deprivation therapy.  PSA nadir was < 0.1.  05/23/2014  Prostate MRI revealed  areas suspicious for extracapsular extension in the right posterior apex and along the posterior aspect of the anterior urethra.  ADT was discussed but due to history of side effects he elected to continue surveillance.  #2016 PSA increased and he was started on Lupron , intermittently secondary to side effects.  # 01/2016 PSA increased to 3.64 05/01/2016 CT revealed no evidence of recurrent or metastatic disease. Bone scan on 05/01/2016 showed no evidence of osseous metastatic disease # 07/10/2016 Seen by Dr.Finnegan, started on Leupron.  # 10/26/2016 Dr. Merilynn Stapler at [facility] recommended discontinuation of Lupron  as his PSA was < 4.0. He was to pursue intermittent therapy when PSA increased to about 10.  Testosterone  and PSA checks were recommended every 2 months and PET/CT scan with F-18 fluciclovine to assess for oligomenorrhea static disease if PSA began to rise.  12/02/2017 Axumin  PET scan revealed potential local recurrence in the residual left prostate gland, with the region of activity relatively small at approximately 1.5 cm. No evidence of metastatic disease in the abdomen pelvis or skeleton 09/02/2018 by Dr. Adrian Alba.  At that time, PSA was 5.98.  He agreed to reinitiate Lupron  (received 09/12/2018).  07/02/2020 PET Axumin  scan on  revealed intense radiotracer activity in the LEFT prostate gland concern for residual carcinoma.  There was no significant change from PET-CT 09/09/2018.  There was no nodal metastasis or skeletal metastasis.  He is s/p RIGHT nephrectomy.  08/22/2020 revealed prostatic adenocarcinoma in 9/12 (six from the left  side; 3 from the right side) biopsies.  The tumor showed mixed morphology with some areas showing ADT treatment effect and other areas without significant treatment effect.  Pattern was Gleason 4 and 5 in areas of less treatment effect.  The background prostate was fibrotic c/w radiation effect.   He has a history of low-grade urothelial carcinoma of the  bladder diagnosed in 2004 with recurrences in 2005 and 2007.  He undergoes surveillance cystoscopy by Dr. Teresa Fender [facility]).   Last cystoscopy was normal on 01/12/2019.  on Eligard  45mg  Q6 months. Last received on 04/02/2021 07/10/2021, patient started on Abiateron 750 mg daily and prednisone  5 mg daily.  Interval History:  INTERVAL HISTORY James Lucas is a 83 y.o. male who has above history reviewed by me today presents for follow up visit for management of prostate cancer He missed his appt recently, and rescheduled to today.  He is on Abiaterone 750mg  daily and prednisone  5mg  daily, tolerates well.  Patient reports being compliant with medications.   Review of Systems  Constitutional:  Positive for fatigue. Negative for appetite change, chills, fever and unexpected weight change.  HENT:   Negative for hearing loss and voice change.   Eyes:  Negative for eye problems and icterus.  Respiratory:  Negative for chest tightness, cough and shortness of breath.   Cardiovascular:  Negative for chest pain and leg swelling.  Gastrointestinal:  Negative for abdominal distention and abdominal pain.  Endocrine: Positive for hot flashes.  Genitourinary:  Negative for difficulty urinating, dysuria and frequency.   Skin:  Negative for itching and rash.  Neurological:  Negative for light-headedness and numbness.  Hematological:  Negative for adenopathy. Does not bruise/bleed easily.  Psychiatric/Behavioral:  Negative for confusion.      Past Medical History:  Diagnosis Date   Allergic rhinitis    Bladder cancer (HCC)    BPH (benign prostatic hyperplasia)    Chronic back pain    Essential hypertension    Glaucoma    Impotence of organic origin    Liver laceration    Prostate cancer (HCC)    Stenosis, spinal, lumbar     Past Surgical History:  Procedure Laterality Date   BACK SURGERY     COLONOSCOPY     COLONOSCOPY WITH PROPOFOL  N/A 08/24/2019   Procedure: COLONOSCOPY WITH PROPOFOL ;   Surgeon: Deveron Fly, MD;  Location: [facility] ENDOSCOPY;  Service: Endoscopy;  Laterality: N/A;   HERNIA REPAIR     NEPHRECTOMY Right 1978   TRANSURETHRAL RESECTION OF BLADDER TUMOR WITH GYRUS (TURBT-GYRUS)      Family History  Problem Relation Age of Onset   Cancer Father     Social History:  reports that he quit smoking about 60 years ago. His smoking use included cigarettes. He has never used smokeless tobacco. He reports that he does not drink alcohol and does not use drugs.  Allergies:  Allergies  Allergen Reactions   Ace Inhibitors Cough    Current Medications: Current Outpatient Medications  Medication Sig Dispense Refill   abiraterone  acetate (ZYTIGA ) 250 MG tablet TAKE 3 TABLETS BY MOUTH DAILY. TAKE ON AN EMPTY STOMACH 1 HOUR BEFORE OR 2 HOURS AFTER A MEAL 90 tablet 3   albuterol (VENTOLIN HFA) 108 (90 Base) MCG/ACT inhaler Inhale 2 puffs into the lungs every 4 (four) hours as needed for wheezing or shortness of breath.     amLODipine  (NORVASC ) 2.5 MG tablet Take 1 tablet (2.5 mg total) by mouth See admin instructions. Take 1 tablet  if systolic BP is persistently above 150 90 tablet 1   aspirin EC 81 MG tablet Take 81 mg by mouth daily.      calcium -vitamin D (OSCAL WITH D) 500-5 MG-MCG tablet Take 1 tablet by mouth daily. 90 tablet 3   ferrous sulfate 325 (65 FE) MG tablet Take by mouth.     fluticasone  (FLONASE ) 50 MCG/ACT nasal spray Place 1 spray into both nostrils daily as needed.     latanoprost  (XALATAN ) 0.005 % ophthalmic solution Place 1 drop into both eyes at bedtime.      montelukast  (SINGULAIR ) 10 MG tablet Take 10 mg by mouth daily.      Multiple Vitamin (MULTI-VITAMIN) tablet Take 1 tablet by mouth daily.     omeprazole  (PRILOSEC) 20 MG capsule Take 1 capsule (20 mg total) by mouth daily as needed. 90 capsule 2   predniSONE  (DELTASONE ) 5 MG tablet TAKE 1 TABLET DAILY WITH BREAKFAST 90 tablet 3   triamterene-hydrochlorothiazide  (MAXZIDE-25) 37.5-25 MG tablet  Take 1 tablet by mouth daily.     vitamin B-12 (CYANOCOBALAMIN) 1000 MCG tablet Take 1,000 mcg by mouth daily.      No current facility-administered medications for this visit.     Performance status (ECOG): 1  Vital Signs: Blood pressure (!) 143/72, pulse 72, temperature 98.4 F (36.9 C), temperature source Tympanic, resp. rate 18, weight 224 lb 9.6 oz (101.9 kg), SpO2 100%. Physical Exam Constitutional:      General: He is not in acute distress.    Appearance: He is not diaphoretic.  HENT:     Head: Normocephalic and atraumatic.  Eyes:     General: No scleral icterus. Cardiovascular:     Rate and Rhythm: Normal rate and regular rhythm.  Pulmonary:     Effort: Pulmonary effort is normal. No respiratory distress.     Breath sounds: Normal breath sounds.  Abdominal:     General: Bowel sounds are normal. There is no distension.     Palpations: Abdomen is soft.     Tenderness: There is no abdominal tenderness.  Musculoskeletal:        General: Normal range of motion.     Cervical back: Normal range of motion and neck supple.  Skin:    General: Skin is warm and dry.     Findings: No erythema.  Neurological:     Mental Status: He is alert and oriented to person, place, and time.     Cranial Nerves: No cranial nerve deficit.     Motor: No abnormal muscle tone.     Coordination: Coordination normal.  Psychiatric:        Mood and Affect: Mood and affect normal.    Labs    Latest Ref Rng & Units 03/30/2024   10:42 AM 01/07/2024    9:45 AM 10/01/2023    9:45 AM  CBC  WBC 4.0 - 10.5 K/uL 9.7  9.7  8.6   Hemoglobin 13.0 - 17.0 g/dL 91.4  78.2  95.6   Hematocrit 39.0 - 52.0 % 37.5  37.1  39.3   Platelets 150 - 400 K/uL 196  202  224       Latest Ref Rng & Units 03/30/2024   10:42 AM 01/07/2024    9:45 AM 10/01/2023    9:45 AM  CMP  Glucose 70 - 99 mg/dL 213  086  578   BUN 8 - 23 mg/dL 15  14  10    Creatinine 0.61 - 1.24 mg/dL 4.69  1.06  1.02   Sodium 135 - 145 mmol/L  140  142  137   Potassium 3.5 - 5.1 mmol/L 4.9  4.3  3.7   Chloride 98 - 111 mmol/L 100  103  98   CO2 22 - 32 mmol/L 29  31  29    Calcium  8.9 - 10.3 mg/dL 9.1  9.4  9.3   Total Protein 6.5 - 8.1 g/dL 6.5  6.3  6.9   Total Bilirubin 0.0 - 1.2 mg/dL 0.7  0.8  1.1   Alkaline Phos 38 - 126 U/L 49  43  44   AST 15 - 41 U/L 22  20  21    ALT 0 - 44 U/L 14  14  16

## 2024-03-30 NOTE — Assessment & Plan Note (Signed)
 Eligard  45mg  every 6 months, proceed today- next due Nov 2025

## 2024-03-30 NOTE — Assessment & Plan Note (Signed)
 Stable.  Monitor.

## 2024-03-30 NOTE — Assessment & Plan Note (Signed)
 currently on Triamterene -hydrochlorothiazide 37.5-25 mg daily.    Norvasc 2.5 mg BP is stable and well controlled.

## 2024-03-30 NOTE — Assessment & Plan Note (Signed)
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 Castration sensitive local recurrence.  Currently receiving Eligard every 6 months  Continue Abiateron 750 mg plus prednisone 5 mg daily.  PSA 0.01, stable. Today's level is pending.   He takes omeprazole 20mg  daily PRN heart burn.

## 2024-04-05 ENCOUNTER — Other Ambulatory Visit: Payer: Self-pay

## 2024-04-05 ENCOUNTER — Encounter: Payer: Self-pay | Admitting: Ophthalmology

## 2024-04-05 NOTE — Anesthesia Preprocedure Evaluation (Signed)
 Anesthesia Evaluation  Patient identified by MRN, date of birth, ID band Patient awake    Reviewed: Allergy & Precautions, H&P , NPO status , Patient's Chart, lab work & pertinent test results  Airway Mallampati: III       Dental  (+) Upper Dentures, Partial Lower   Pulmonary neg pulmonary ROS, former smoker          Cardiovascular hypertension, negative cardio ROS      Neuro/Psych negative neurological ROS  negative psych ROS   GI/Hepatic negative GI ROS, Neg liver ROS,,,  Endo/Other  negative endocrine ROS    Renal/GU negative Renal ROS  negative genitourinary   Musculoskeletal negative musculoskeletal ROS (+)    Abdominal   Peds negative pediatric ROS (+)  Hematology negative hematology ROS (+) Blood dyscrasia, anemia   Anesthesia Other Findings Prostate cancer (HCC)  Bladder cancer (HCC) Chronic back pain  Essential hypertension Allergic rhinitis BPH (benign prostatic hyperplasia) Impotence of organic origin  Stenosis, spinal, lumbar Liver laceration  Glaucoma Solitary kidney, acquired  Vertigo Wears dentures     Reproductive/Obstetrics negative OB ROS                              Anesthesia Physical Anesthesia Plan  ASA: 3  Anesthesia Plan: MAC   Post-op Pain Management:    Induction: Intravenous  PONV Risk Score and Plan:   Airway Management Planned: Natural Airway and Nasal Cannula  Additional Equipment:   Intra-op Plan:   Post-operative Plan:   Informed Consent: I have reviewed the patients History and Physical, chart, labs and discussed the procedure including the risks, benefits and alternatives for the proposed anesthesia with the patient or authorized representative who has indicated his/her understanding and acceptance.     Dental Advisory Given  Plan Discussed with: Anesthesiologist, CRNA and Surgeon  Anesthesia Plan Comments: (Patient  consented for risks of anesthesia including but not limited to:  - adverse reactions to medications - damage to eyes, teeth, lips or other oral mucosa - nerve damage due to positioning  - sore throat or hoarseness - Damage to heart, brain, nerves, lungs, other parts of body or loss of life  Patient voiced understanding and assent.)         Anesthesia Quick Evaluation

## 2024-04-05 NOTE — Progress Notes (Signed)
 Specialty Pharmacy Refill Coordination Note  James Lucas is a 83 y.o. male contacted today regarding refills of specialty medication(s) Abiraterone  Acetate (ZYTIGA )   Patient requested (Patient-Rptd) Delivery   Delivery date: (Patient-Rptd) 04/13/24   Verified address: (Patient-Rptd) [address] , [place], [place] [address]   Medication will be filled on 04/12/24.

## 2024-04-06 ENCOUNTER — Other Ambulatory Visit: Payer: Self-pay | Admitting: Oncology

## 2024-04-11 ENCOUNTER — Telehealth: Payer: Self-pay | Admitting: *Deleted

## 2024-04-11 ENCOUNTER — Encounter: Payer: Self-pay | Admitting: Oncology

## 2024-04-11 MED ORDER — OMEPRAZOLE 20 MG PO CPDR: 20.0000 mg | DELAYED_RELEASE_CAPSULE | Freq: Every day | ORAL | 0 refills | Status: DC | PRN

## 2024-04-11 NOTE — Discharge Instructions (Signed)

## 2024-04-12 ENCOUNTER — Ambulatory Visit: Payer: Self-pay | Admitting: Anesthesiology

## 2024-04-12 ENCOUNTER — Other Ambulatory Visit: Payer: Self-pay

## 2024-04-12 ENCOUNTER — Ambulatory Visit
Admission: RE | Admit: 2024-04-12 | Discharge: 2024-04-12 | Disposition: A | Discharge status: Home / Self Care | Attending: Ophthalmology | Admitting: Ophthalmology

## 2024-04-12 ENCOUNTER — Encounter
Admission: RE | Disposition: A | Discharge status: Home / Self Care | Payer: Self-pay | Source: Home / Self Care | Attending: Ophthalmology

## 2024-04-12 ENCOUNTER — Encounter: Payer: Self-pay | Admitting: Ophthalmology

## 2024-04-12 DIAGNOSIS — H2511 Age-related nuclear cataract, right eye: Secondary | ICD-10-CM | POA: Diagnosis present

## 2024-04-12 DIAGNOSIS — D649 Anemia, unspecified: Secondary | ICD-10-CM | POA: Diagnosis not present

## 2024-04-12 DIAGNOSIS — Z87891 Personal history of nicotine dependence: Secondary | ICD-10-CM | POA: Insufficient documentation

## 2024-04-12 DIAGNOSIS — I10 Essential (primary) hypertension: Secondary | ICD-10-CM | POA: Insufficient documentation

## 2024-04-12 HISTORY — DX: Dizziness and giddiness: R42

## 2024-04-12 HISTORY — DX: Acquired absence of kidney: Z90.5

## 2024-04-12 HISTORY — DX: Presence of dental prosthetic device (complete) (partial): Z97.2

## 2024-04-12 SURGERY — PHACOEMULSIFICATION, CATARACT, WITH IOL INSERTION
Anesthesia: Monitor Anesthesia Care | Laterality: Right

## 2024-04-12 MED ORDER — ARMC OPHTHALMIC DILATING DROPS
OPHTHALMIC | Status: AC
  Filled 2024-04-12: qty 0.5

## 2024-04-12 MED ORDER — SIGHTPATH DOSE#1 BSS IO SOLN
INTRAOCULAR | Status: DC | PRN
  Administered 2024-04-12: 15 mL via INTRAOCULAR

## 2024-04-12 MED ORDER — FENTANYL CITRATE (PF) 100 MCG/2ML IJ SOLN
INTRAMUSCULAR | Status: DC | PRN
  Administered 2024-04-12: 50 ug via INTRAVENOUS

## 2024-04-12 MED ORDER — ARMC OPHTHALMIC DILATING DROPS
1.0000 | OPHTHALMIC | Status: DC | PRN
  Administered 2024-04-12 (×3): 1 via OPHTHALMIC

## 2024-04-12 MED ORDER — SIGHTPATH DOSE#1 NA HYALUR & NA CHOND-NA HYALUR IO KIT
PACK | INTRAOCULAR | Status: DC | PRN
  Administered 2024-04-12: 1 via OPHTHALMIC

## 2024-04-12 MED ORDER — FENTANYL CITRATE (PF) 100 MCG/2ML IJ SOLN
INTRAMUSCULAR | Status: AC
  Filled 2024-04-12: qty 2

## 2024-04-12 MED ORDER — MIDAZOLAM HCL 2 MG/2ML IJ SOLN
INTRAMUSCULAR | Status: DC | PRN
  Administered 2024-04-12: 1 mg via INTRAVENOUS

## 2024-04-12 MED ORDER — CEFUROXIME OPHTHALMIC INJECTION 1 MG/0.1 ML
INJECTION | OPHTHALMIC | Status: DC | PRN
  Administered 2024-04-12: .1 mL via INTRACAMERAL

## 2024-04-12 MED ORDER — SODIUM CHLORIDE 0.9% FLUSH
INTRAVENOUS | Status: DC | PRN
  Administered 2024-04-12: 10 mL via INTRAVENOUS

## 2024-04-12 MED ORDER — TETRACAINE HCL 0.5 % OP SOLN
OPHTHALMIC | Status: AC
  Filled 2024-04-12: qty 4

## 2024-04-12 MED ORDER — TETRACAINE HCL 0.5 % OP SOLN
1.0000 [drp] | OPHTHALMIC | Status: DC | PRN
  Administered 2024-04-12 (×3): 1 [drp] via OPHTHALMIC

## 2024-04-12 MED ORDER — MIDAZOLAM HCL 2 MG/2ML IJ SOLN
INTRAMUSCULAR | Status: AC
  Filled 2024-04-12: qty 2

## 2024-04-12 MED ORDER — LIDOCAINE HCL (PF) 2 % IJ SOLN
INTRAMUSCULAR | Status: DC | PRN
  Administered 2024-04-12: 1 mL

## 2024-04-12 MED ORDER — SIGHTPATH DOSE#1 BSS IO SOLN
INTRAOCULAR | Status: DC | PRN
  Administered 2024-04-12: 62 mL via OPHTHALMIC

## 2024-04-12 MED ORDER — BRIMONIDINE TARTRATE-TIMOLOL 0.2-0.5 % OP SOLN
OPHTHALMIC | Status: DC | PRN
  Administered 2024-04-12: 1 [drp] via OPHTHALMIC

## 2024-04-12 SURGICAL SUPPLY — 10 items
CATARACT SUITE SIGHTPATH (MISCELLANEOUS) ×1 IMPLANT
FEE CATARACT SUITE SIGHTPATH (MISCELLANEOUS) ×1 IMPLANT
GLOVE BIOGEL PI IND STRL 8 (GLOVE) ×1 IMPLANT
GLOVE SURG LX STRL 7.5 STRW (GLOVE) ×1 IMPLANT
GLOVE SURG PROTEXIS BL SZ6.5 (GLOVE) ×1 IMPLANT
GLOVE SURG SYN 6.5 PF PI BL (GLOVE) ×1 IMPLANT
LENS IOL TECNIS EYHANCE 21.5 (Intraocular Lens) IMPLANT
NDL FILTER BLUNT 18X1 1/2 (NEEDLE) ×1 IMPLANT
NEEDLE FILTER BLUNT 18X1 1/2 (NEEDLE) ×1 IMPLANT
SYR 3ML LL SCALE MARK (SYRINGE) ×1 IMPLANT

## 2024-04-12 NOTE — Op Note (Signed)
 LOCATION:  [facility]   PREOPERATIVE DIAGNOSIS:    Nuclear sclerotic cataract right eye. H25.11   POSTOPERATIVE DIAGNOSIS:  Nuclear sclerotic cataract right eye.     PROCEDURE:  Phacoemusification with posterior chamber intraocular lens placement of the right eye   ULTRASOUND TIME: Procedure(s): PHACOEMULSIFICATION, CATARACT, WITH IOL INSERTION 8.07, 00:44.2 (Right)  LENS:   Implant Name Type Inv. Item Serial No. Manufacturer Lot No. LRB No. Used Action  LENS IOL TECNIS EYHANCE 21.5 - [id number] Intraocular Lens LENS IOL TECNIS EYHANCE 21.5 [id number] [employer]  Right 1 Implanted         SURGEON:  Berline Brenner, MD   ANESTHESIA:  Topical with tetracaine drops and 2% Xylocaine jelly, augmented with 1% preservative-free intracameral lidocaine.    COMPLICATIONS:  None.   DESCRIPTION OF PROCEDURE:  The patient was identified in the holding room and transported to the operating room and placed in the supine position under the operating microscope.  The right eye was identified as the operative eye and it was prepped and draped in the usual sterile ophthalmic fashion.   A 1 millimeter clear-corneal paracentesis was made at the 12:00 position.  0.5 ml of preservative-free 1% lidocaine was injected into the anterior chamber. The anterior chamber was filled with Viscoat viscoelastic.  A 2.4 millimeter keratome was used to make a near-clear corneal incision at the 9:00 position.  A curvilinear capsulorrhexis was made with a cystotome and capsulorrhexis forceps.  Balanced salt solution was used to hydrodissect and hydrodelineate the nucleus.   Phacoemulsification was then used in stop and chop fashion to remove the lens nucleus and epinucleus.  The remaining cortex was then removed using the irrigation and aspiration handpiece. Provisc was then placed into the capsular bag to distend it for lens placement.  A lens was then injected into the capsular bag.  The remaining  viscoelastic was aspirated.   Wounds were hydrated with balanced salt solution.  The anterior chamber was inflated to a physiologic pressure with balanced salt solution.  No wound leaks were noted. Cefuroxime 0.1 ml of a 10mg /ml solution was injected into the anterior chamber for a dose of 1 mg of intracameral antibiotic at the completion of the case.   Timolol and Brimonidine drops were applied to the eye.  The patient was taken to the recovery room in stable condition without complications of anesthesia or surgery.   Jniyah Dantuono 04/12/2024, 10:52 AM

## 2024-04-12 NOTE — H&P (Signed)
 [facility]   Primary Care Physician:  Deliah Fells, NP Ophthalmologist: Dr. Annell Kidney  Pre-Procedure History & Physical: HPI:  James Lucas is a 83 y.o. male here for ophthalmic surgery.   Past Medical History:  Diagnosis Date   Allergic rhinitis    Bladder cancer (HCC)    BPH (benign prostatic hyperplasia)    Chronic back pain    Essential hypertension    Glaucoma    Impotence of organic origin    Liver laceration    Prostate cancer (HCC)    Solitary kidney, acquired    left kidney remains   Stenosis, spinal, lumbar    Vertigo    x1. "several years ago"   Wears dentures    full upper, partial lower    Past Surgical History:  Procedure Laterality Date   BACK SURGERY     COLONOSCOPY     COLONOSCOPY WITH PROPOFOL  N/A 08/24/2019   Procedure: COLONOSCOPY WITH PROPOFOL ;  Surgeon: Deveron Fly, MD;  Location: [facility] ENDOSCOPY;  Service: Endoscopy;  Laterality: N/A;   HERNIA REPAIR     NEPHRECTOMY Right 1978   TRANSURETHRAL RESECTION OF BLADDER TUMOR WITH GYRUS (TURBT-GYRUS)      Prior to Admission medications   Medication Sig Start Date End Date Taking? Authorizing Provider  abiraterone  acetate (ZYTIGA ) 250 MG tablet TAKE 3 TABLETS BY MOUTH DAILY. TAKE ON AN EMPTY STOMACH 1 HOUR BEFORE OR 2 HOURS AFTER A MEAL 03/15/24  Yes Timmy Forbes, MD  amLODipine  (NORVASC ) 2.5 MG tablet Take 1 tablet (2.5 mg total) by mouth See admin instructions. Take 1 tablet if systolic BP is persistently above 150 06/23/23  Yes Timmy Forbes, MD  aspirin EC 81 MG tablet Take 81 mg by mouth daily.    Yes [provider]  calcium -vitamin D (OSCAL WITH D) 500-5 MG-MCG tablet Take 1 tablet by mouth daily. 02/23/22  Yes Timmy Forbes, MD  ferrous sulfate 325 (65 FE) MG tablet Take by mouth.   Yes [provider]  Iodine, Kelp, (KELP PO) Take 600 mg by mouth daily.   Yes [provider]  latanoprost  (XALATAN ) 0.005 % ophthalmic solution Place 1 drop into both  eyes at bedtime.  03/07/19  Yes [provider]  Leuprolide  Acetate (LUPRON  IJ) Inject as directed every 6 (six) months.   Yes [provider]  montelukast  (SINGULAIR ) 10 MG tablet Take 10 mg by mouth daily.  01/21/16  Yes [provider]  Multiple Vitamin (MULTI-VITAMIN) tablet Take 1 tablet by mouth daily.   Yes [provider]  omeprazole  (PRILOSEC) 20 MG capsule Take 1 capsule (20 mg total) by mouth daily as needed. 04/11/24  Yes Timmy Forbes, MD  OVER THE COUNTER MEDICATION Take 800 mg by mouth daily. Sea moss   Yes [provider]  predniSONE  (DELTASONE ) 5 MG tablet TAKE 1 TABLET DAILY WITH BREAKFAST 09/13/23  Yes Timmy Forbes, MD  triamterene-hydrochlorothiazide  (MAXZIDE-25) 37.5-25 MG tablet Take 1 tablet by mouth daily. 03/23/23 04/12/24 Yes [provider]  vitamin B-12 (CYANOCOBALAMIN) 1000 MCG tablet Take 1,000 mcg by mouth daily.    Yes [provider]    Allergies as of 03/23/2024 - Review Complete 02/03/2024  Allergen Reaction Noted   Ace inhibitors Cough 02/10/2016    Family History  Problem Relation Age of Onset   Cancer Father     Social History   Socioeconomic History   Marital status: Married    Spouse name: Not on file   Number  of children: Not on file   Years of education: Not on file   Highest education level: Not on file  Occupational History   Not on file  Tobacco Use   Smoking status: Former    Current packs/day: 0.00    Types: Cigarettes    Quit date: 66    Years since quitting: 60.4   Smokeless tobacco: Never  Vaping Use   Vaping status: Never Used  Substance and Sexual Activity   Alcohol use: No   Drug use: No   Sexual activity: Not on file  Other Topics Concern   Not on file  Social History Narrative   Not on file   Social Drivers of Health   Financial Resource Strain: Low Risk  (09/24/2023)   Received [facility]   Overall Financial Resource Strain (CARDIA)     Difficulty of Paying Living Expenses: Not hard at all  Food Insecurity: No Food Insecurity (09/24/2023)   Received from [facility]   Hunger Vital Sign    Worried About Running Out of Food in the Last Year: Never true    Ran Out of Food in the Last Year: Never true  Transportation Needs: No Transportation Needs (09/24/2023)   Received from [facility] - Transportation    In the past 12 months, has lack of transportation kept you from medical appointments or from getting medications?: No    Lack of Transportation (Non-Medical): No  Physical Activity: Not on file  Stress: Not on file  Social Connections: Not on file  Intimate Partner Violence: Not on file    Review of Systems: See HPI, otherwise negative ROS  Physical Exam: BP (!) 151/80   Pulse 79   Temp 98.1 F (36.7 C) (Temporal)   Resp 20   Ht 6\' 1"  (1.854 m)   Wt 103.4 kg   SpO2 99%   BMI 30.08 kg/m  General:   Alert,  pleasant and cooperative in NAD Head:  Normocephalic and atraumatic. Lungs:  Clear to auscultation.    Heart:  Regular rate and rhythm.   Impression/Plan: James Lucas is here for ophthalmic surgery.  Risks, benefits, limitations, and alternatives regarding ophthalmic surgery have been reviewed with the patient.  Questions have been answered.  All parties agreeable.   Annell Kidney, MD  04/12/2024, 9:32 AM

## 2024-04-12 NOTE — Transfer of Care (Signed)
 Immediate Anesthesia Transfer of Care Note  Patient: James Lucas  Procedure(s) Performed: PHACOEMULSIFICATION, CATARACT, WITH IOL INSERTION 8.07, 00:44.2 (Right)  Patient Location: PACU  Anesthesia Type:MAC  Level of Consciousness: awake and alert   Airway & Oxygen Therapy: Patient Spontanous Breathing  Post-op Assessment: Report given to RN and Post -op Vital signs reviewed and stable  Post vital signs: Reviewed and stable  Last Vitals:  Vitals Value Taken Time  BP 142/69 04/12/24 1055  Temp 36.4 C 04/12/24 1055  Pulse 77 04/12/24 1056  Resp 12 04/12/24 1056  SpO2 99 % 04/12/24 1056  Vitals shown include unfiled device data.  Last Pain:  Vitals:   04/12/24 1055  TempSrc:   PainSc: 0-No pain         Complications: No notable events documented.

## 2024-04-12 NOTE — Anesthesia Postprocedure Evaluation (Signed)
 Anesthesia Post Note  Patient: James Lucas  Procedure(s) Performed: PHACOEMULSIFICATION, CATARACT, WITH IOL INSERTION 8.07, 00:44.2 (Right)  Patient location during evaluation: PACU Anesthesia Type: MAC Level of consciousness: awake and alert Pain management: pain level controlled Vital Signs Assessment: post-procedure vital signs reviewed and stable Respiratory status: spontaneous breathing, nonlabored ventilation, respiratory function stable and patient connected to nasal cannula oxygen Cardiovascular status: stable and blood pressure returned to baseline Postop Assessment: no apparent nausea or vomiting Anesthetic complications: no   No notable events documented.   Last Vitals:  Vitals:   04/12/24 1055 04/12/24 1058  BP: (!) 142/69 (!) 140/70  Pulse: 74 73  Resp: 14 16  Temp: (!) 36.4 C (!) 36.4 C  SpO2: 98% 98%    Last Pain:  Vitals:   04/12/24 1058  TempSrc:   PainSc: 0-No pain                 Lulamae Skorupski C Jacalyn Biggs

## 2024-04-13 ENCOUNTER — Encounter: Payer: Self-pay | Admitting: Ophthalmology

## 2024-04-17 NOTE — Anesthesia Preprocedure Evaluation (Addendum)
 Anesthesia Evaluation  Patient identified by MRN, date of birth, ID band Patient awake    Reviewed: Allergy & Precautions, H&P , NPO status , Patient's Chart, lab work & pertinent test results  Airway Mallampati: III  TM Distance: >3 FB Neck ROM: Full    Dental no notable dental hx. (+) Upper Dentures, Partial Lower Upper Dentures, Partial Lower:   Pulmonary neg pulmonary ROS, former smoker   Pulmonary exam normal breath sounds clear to auscultation       Cardiovascular hypertension, negative cardio ROS Normal cardiovascular exam Rhythm:Regular Rate:Normal     Neuro/Psych negative neurological ROS  negative psych ROS   GI/Hepatic negative GI ROS, Neg liver ROS,,,  Endo/Other  negative endocrine ROS    Renal/GU negative Renal ROS  negative genitourinary   Musculoskeletal negative musculoskeletal ROS (+)    Abdominal   Peds negative pediatric ROS (+)  Hematology negative hematology ROS (+)   Anesthesia Other Findings Previous cataract surgery 04-12-24 Dr. Aldo Amble  Prostate cancer [facility])  Bladder cancer [facility]) Chronic back pain         Essential hypertension Allergic rhinitisBPH (benign prostatic hyperplasia) Impotence of organic origin          Stenosis, spinal, lumbar Liver laceration        Glaucoma Solitary kidney, acquired          Vertigo Wears dentures                   Reproductive/Obstetrics negative OB ROS                             Anesthesia Physical Anesthesia Plan  ASA: 3  Anesthesia Plan: MAC   Post-op Pain Management:    Induction: Intravenous  PONV Risk Score and Plan:   Airway Management Planned: Natural Airway and Nasal Cannula  Additional Equipment:   Intra-op Plan:   Post-operative Plan:   Informed Consent: I have reviewed the patients History and Physical, chart, labs and discussed the procedure including the risks, benefits and alternatives  for the proposed anesthesia with the patient or authorized representative who has indicated his/her understanding and acceptance.     Dental Advisory Given  Plan Discussed with: Anesthesiologist, CRNA and Surgeon  Anesthesia Plan Comments: (Patient consented for risks of anesthesia including but not limited to:  - adverse reactions to medications - damage to eyes, teeth, lips or other oral mucosa - nerve damage due to positioning  - sore throat or hoarseness - Damage to heart, brain, nerves, lungs, other parts of body or loss of life  Patient voiced understanding and assent.)        Anesthesia Quick Evaluation

## 2024-04-25 NOTE — Discharge Instructions (Signed)

## 2024-04-26 ENCOUNTER — Other Ambulatory Visit: Payer: Self-pay

## 2024-04-26 ENCOUNTER — Encounter
Admission: RE | Disposition: A | Discharge status: Home / Self Care | Payer: Self-pay | Source: Home / Self Care | Attending: Ophthalmology

## 2024-04-26 ENCOUNTER — Ambulatory Visit: Payer: Self-pay | Admitting: Anesthesiology

## 2024-04-26 ENCOUNTER — Ambulatory Visit
Admission: RE | Admit: 2024-04-26 | Discharge: 2024-04-26 | Disposition: A | Discharge status: Home / Self Care | Attending: Ophthalmology | Admitting: Ophthalmology

## 2024-04-26 ENCOUNTER — Encounter: Payer: Self-pay | Admitting: Ophthalmology

## 2024-04-26 DIAGNOSIS — I10 Essential (primary) hypertension: Secondary | ICD-10-CM | POA: Diagnosis not present

## 2024-04-26 DIAGNOSIS — H2512 Age-related nuclear cataract, left eye: Secondary | ICD-10-CM | POA: Insufficient documentation

## 2024-04-26 DIAGNOSIS — Z87891 Personal history of nicotine dependence: Secondary | ICD-10-CM | POA: Diagnosis not present

## 2024-04-26 SURGERY — PHACOEMULSIFICATION, CATARACT, WITH IOL INSERTION
Anesthesia: Monitor Anesthesia Care | Laterality: Left

## 2024-04-26 MED ORDER — SIGHTPATH DOSE#1 BSS IO SOLN
INTRAOCULAR | Status: DC | PRN
  Administered 2024-04-26: 15 mL via INTRAOCULAR

## 2024-04-26 MED ORDER — SIGHTPATH DOSE#1 NA HYALUR & NA CHOND-NA HYALUR IO KIT
PACK | INTRAOCULAR | Status: DC | PRN
  Administered 2024-04-26: 1 via OPHTHALMIC

## 2024-04-26 MED ORDER — BRIMONIDINE TARTRATE-TIMOLOL 0.2-0.5 % OP SOLN
OPHTHALMIC | Status: DC | PRN
  Administered 2024-04-26: 1 [drp] via OPHTHALMIC

## 2024-04-26 MED ORDER — LIDOCAINE HCL (PF) 2 % IJ SOLN
INTRAMUSCULAR | Status: DC | PRN
  Administered 2024-04-26: 1 mL

## 2024-04-26 MED ORDER — TETRACAINE HCL 0.5 % OP SOLN
1.0000 [drp] | OPHTHALMIC | Status: DC | PRN
  Administered 2024-04-26 (×3): 1 [drp] via OPHTHALMIC

## 2024-04-26 MED ORDER — MIDAZOLAM HCL 2 MG/2ML IJ SOLN
INTRAMUSCULAR | Status: AC
Start: 2024-04-26 — End: 2024-04-26
  Filled 2024-04-26: qty 2

## 2024-04-26 MED ORDER — ARMC OPHTHALMIC DILATING DROPS
OPHTHALMIC | Status: AC
  Filled 2024-04-26: qty 0.5

## 2024-04-26 MED ORDER — ARMC OPHTHALMIC DILATING DROPS
1.0000 | OPHTHALMIC | Status: DC | PRN
  Administered 2024-04-26 (×3): 1 via OPHTHALMIC

## 2024-04-26 MED ORDER — TETRACAINE HCL 0.5 % OP SOLN
OPHTHALMIC | Status: AC
  Filled 2024-04-26: qty 4

## 2024-04-26 MED ORDER — FENTANYL CITRATE (PF) 100 MCG/2ML IJ SOLN
INTRAMUSCULAR | Status: AC
Start: 2024-04-26 — End: 2024-04-26
  Filled 2024-04-26: qty 2

## 2024-04-26 MED ORDER — CEFUROXIME OPHTHALMIC INJECTION 1 MG/0.1 ML
INJECTION | OPHTHALMIC | Status: DC | PRN
Start: 2024-04-26 — End: 2024-04-26
  Administered 2024-04-26: .1 mL via INTRACAMERAL

## 2024-04-26 MED ORDER — FENTANYL CITRATE (PF) 100 MCG/2ML IJ SOLN
INTRAMUSCULAR | Status: DC | PRN
  Administered 2024-04-26: 50 ug via INTRAVENOUS

## 2024-04-26 MED ORDER — SIGHTPATH DOSE#1 BSS IO SOLN
INTRAOCULAR | Status: DC | PRN
  Administered 2024-04-26: 64 mL via OPHTHALMIC

## 2024-04-26 MED ORDER — MIDAZOLAM HCL 2 MG/2ML IJ SOLN
INTRAMUSCULAR | Status: DC | PRN
  Administered 2024-04-26: 1 mg via INTRAVENOUS

## 2024-04-26 SURGICAL SUPPLY — 10 items
CATARACT SUITE SIGHTPATH (MISCELLANEOUS) ×1 IMPLANT
FEE CATARACT SUITE SIGHTPATH (MISCELLANEOUS) ×1 IMPLANT
GLOVE BIOGEL PI IND STRL 8 (GLOVE) ×1 IMPLANT
GLOVE SURG LX STRL 7.5 STRW (GLOVE) ×1 IMPLANT
GLOVE SURG PROTEXIS BL SZ6.5 (GLOVE) ×1 IMPLANT
GLOVE SURG SYN 6.5 PF PI BL (GLOVE) ×1 IMPLANT
LENS IOL TECNIS EYHANCE 22.0 (Intraocular Lens) IMPLANT
NDL FILTER BLUNT 18X1 1/2 (NEEDLE) ×1 IMPLANT
NEEDLE FILTER BLUNT 18X1 1/2 (NEEDLE) ×1 IMPLANT
SYR 3ML LL SCALE MARK (SYRINGE) ×1 IMPLANT

## 2024-04-26 NOTE — Op Note (Signed)
 OPERATIVE NOTE  James Lucas [id number] 04/26/2024   PREOPERATIVE DIAGNOSIS:  Nuclear sclerotic cataract left eye. H25.12   POSTOPERATIVE DIAGNOSIS:    Nuclear sclerotic cataract left eye.     PROCEDURE:  Phacoemusification with posterior chamber intraocular lens placement of the left eye  Ultrasound time: Procedure(s): PHACOEMULSIFICATION, CATARACT, WITH IOL INSERTION 5.30, 00:29.0 (Left)  LENS:   Implant Name Type Inv. Item Serial No. Manufacturer Lot No. LRB No. Used Action  LENS IOL TECNIS EYHANCE 22.0 - [id number] Intraocular Lens LENS IOL TECNIS EYHANCE 22.0 [id number] [employer]  Left 1 Implanted      SURGEON:  Berline Brenner, MD   ANESTHESIA:  Topical with tetracaine  drops and 2% Xylocaine  jelly, augmented with 1% preservative-free intracameral lidocaine .    COMPLICATIONS:  None.   DESCRIPTION OF PROCEDURE:  The patient was identified in the holding room and transported to the operating room and placed in the supine position under the operating microscope.  The left eye was identified as the operative eye and it was prepped and draped in the usual sterile ophthalmic fashion.   A 1 millimeter clear-corneal paracentesis was made at the 1:30 position.  0.5 ml of preservative-free 1% lidocaine  was injected into the anterior chamber.  The anterior chamber was filled with Viscoat viscoelastic.  A 2.4 millimeter keratome was used to make a near-clear corneal incision at the 10:30 position.  .  A curvilinear capsulorrhexis was made with a cystotome and capsulorrhexis forceps.  Balanced salt  solution was used to hydrodissect and hydrodelineate the nucleus.   Phacoemulsification was then used in stop and chop fashion to remove the lens nucleus and epinucleus.  The remaining cortex was then removed using the irrigation and aspiration handpiece. Provisc was then placed into the capsular bag to distend it for lens placement.  A lens was then injected into the capsular bag.  The  remaining viscoelastic was aspirated.   Wounds were hydrated with balanced salt  solution.  The anterior chamber was inflated to a physiologic pressure with balanced salt  solution.  No wound leaks were noted. Cefuroxime  0.1 ml of a 10mg /ml solution was injected into the anterior chamber for a dose of 1 mg of intracameral antibiotic at the completion of the case.   Timolol  and Brimonidine  drops were applied to the eye.  The patient was taken to the recovery room in stable condition without complications of anesthesia or surgery.  James Lucas 04/26/2024, 9:21 AM

## 2024-04-26 NOTE — Transfer of Care (Signed)
 Immediate Anesthesia Transfer of Care Note  Patient: James Lucas  Procedure(s) Performed: PHACOEMULSIFICATION, CATARACT, WITH IOL INSERTION 5.30, 00:29.0 (Left)  Patient Location: PACU  Anesthesia Type: MAC  Level of Consciousness: awake, alert  and patient cooperative  Airway and Oxygen Therapy: Patient Spontanous Breathing and Patient connected to supplemental oxygen  Post-op Assessment: Post-op Vital signs reviewed, Patient's Cardiovascular Status Stable, Respiratory Function Stable, Patent Airway and No signs of Nausea or vomiting  Post-op Vital Signs: Reviewed and stable  Complications: No notable events documented.

## 2024-04-26 NOTE — Anesthesia Postprocedure Evaluation (Signed)
 Anesthesia Post Note  Patient: James Lucas  Procedure(s) Performed: PHACOEMULSIFICATION, CATARACT, WITH IOL INSERTION 5.30, 00:29.0 (Left)  Patient location during evaluation: PACU Anesthesia Type: MAC Level of consciousness: awake and alert Pain management: pain level controlled Vital Signs Assessment: post-procedure vital signs reviewed and stable Respiratory status: spontaneous breathing, nonlabored ventilation, respiratory function stable and patient connected to nasal cannula oxygen Cardiovascular status: stable and blood pressure returned to baseline Postop Assessment: no apparent nausea or vomiting Anesthetic complications: no   No notable events documented.   Last Vitals:  Vitals:   04/26/24 0922 04/26/24 0931  BP: 135/76   Pulse: 67   Resp: 18   Temp: 36.6 C 36.6 C  SpO2: 100%     Last Pain:  Vitals:   04/26/24 0931  TempSrc:   PainSc: 0-No pain                 Emilie Harden

## 2024-04-26 NOTE — H&P (Signed)
 [facility]   Primary Care Physician:  Deliah Fells, NP Ophthalmologist: Dr. Annell Kidney  Pre-Procedure History & Physical: HPI:  James Lucas is a 83 y.o. male here for ophthalmic surgery.   Past Medical History:  Diagnosis Date   Allergic rhinitis    Bladder cancer (HCC)    BPH (benign prostatic hyperplasia)    Chronic back pain    Essential hypertension    Glaucoma    Impotence of organic origin    Liver laceration    Prostate cancer (HCC)    Solitary kidney, acquired    left kidney remains   Stenosis, spinal, lumbar    Vertigo    x1. several years ago   Wears dentures    full upper, partial lower    Past Surgical History:  Procedure Laterality Date   BACK SURGERY     CATARACT EXTRACTION W/PHACO Right 04/12/2024   Procedure: PHACOEMULSIFICATION, CATARACT, WITH IOL INSERTION 8.07, 00:44.2;  Surgeon: Annell Kidney, MD;  Location: [facility];  Service: Ophthalmology;  Laterality: Right;   COLONOSCOPY     COLONOSCOPY WITH PROPOFOL  N/A 08/24/2019   Procedure: COLONOSCOPY WITH PROPOFOL ;  Surgeon: Deveron Fly, MD;  Location: [facility];  Service: Endoscopy;  Laterality: N/A;   HERNIA REPAIR     NEPHRECTOMY Right 1978   TRANSURETHRAL RESECTION OF BLADDER TUMOR WITH GYRUS (TURBT-GYRUS)      Prior to Admission medications   Medication Sig Start Date End Date Taking? Authorizing Provider  abiraterone  acetate (ZYTIGA ) 250 MG tablet TAKE 3 TABLETS BY MOUTH DAILY. TAKE ON AN EMPTY STOMACH 1 HOUR BEFORE OR 2 HOURS AFTER A MEAL 03/15/24   Timmy Forbes, MD  amLODipine  (NORVASC ) 2.5 MG tablet Take 1 tablet (2.5 mg total) by mouth See admin instructions. Take 1 tablet if systolic BP is persistently above 150 06/23/23   Timmy Forbes, MD  aspirin EC 81 MG tablet Take 81 mg by mouth daily.     [provider]  calcium -vitamin D (OSCAL WITH D) 500-5 MG-MCG tablet Take 1 tablet by mouth daily. 02/23/22   Timmy Forbes, MD  ferrous sulfate 325  (65 FE) MG tablet Take by mouth.    [provider]  Iodine, Kelp, (KELP PO) Take 600 mg by mouth daily.    [provider]  latanoprost  (XALATAN ) 0.005 % ophthalmic solution Place 1 drop into both eyes at bedtime.  03/07/19   [provider]  Leuprolide  Acetate (LUPRON  IJ) Inject as directed every 6 (six) months.    [provider]  montelukast  (SINGULAIR ) 10 MG tablet Take 10 mg by mouth daily.  01/21/16   [provider]  Multiple Vitamin (MULTI-VITAMIN) tablet Take 1 tablet by mouth daily.    [provider]  omeprazole  (PRILOSEC) 20 MG capsule Take 1 capsule (20 mg total) by mouth daily as needed. 04/11/24   Timmy Forbes, MD  OVER THE COUNTER MEDICATION Take 800 mg by mouth daily. Sea moss    [provider]  predniSONE  (DELTASONE ) 5 MG tablet TAKE 1 TABLET DAILY WITH BREAKFAST 09/13/23   Timmy Forbes, MD  triamterene-hydrochlorothiazide  (MAXZIDE-25) 37.5-25 MG tablet Take 1 tablet by mouth daily. 03/23/23 04/12/24  [provider]  vitamin B-12 (CYANOCOBALAMIN) 1000 MCG tablet Take 1,000 mcg by mouth daily.     [provider]    Allergies as of 03/23/2024 - Review Complete 02/03/2024  Allergen Reaction Noted   Ace inhibitors Cough 02/10/2016    Family History  Problem Relation  Age of Onset   Cancer Father     Social History   Socioeconomic History   Marital status: Married    Spouse name: Not on file   Number of children: Not on file   Years of education: Not on file   Highest education level: Not on file  Occupational History   Not on file  Tobacco Use   Smoking status: Former    Current packs/day: 0.00    Types: Cigarettes    Quit date: 36    Years since quitting: 60.4   Smokeless tobacco: Never  Vaping Use   Vaping status: Never Used  Substance and Sexual Activity   Alcohol use: No   Drug use: No   Sexual activity: Not on file  Other Topics Concern   Not on file  Social History Narrative    Not on file   Social Drivers of Health   Financial Resource Strain: Low Risk  (09/24/2023)   Received from [facility]   Overall Financial Resource Strain (CARDIA)    Difficulty of Paying Living Expenses: Not hard at all  Food Insecurity: No Food Insecurity (09/24/2023)   Received from [facility]   Hunger Vital Sign    Worried About Running Out of Food in the Last Year: Never true    Ran Out of Food in the Last Year: Never true  Transportation Needs: No Transportation Needs (09/24/2023)   Received from [facility]    In the past 12 months, has lack of transportation kept you from medical appointments or from getting medications?: No    Lack of Transportation (Non-Medical): No  Physical Activity: Not on file  Stress: Not on file  Social Connections: Not on file  Intimate Partner Violence: Not on file    Review of Systems: See HPI, otherwise negative ROS  Physical Exam: There were no vitals taken for this visit. General:   Alert,  pleasant and cooperative in NAD Head:  Normocephalic and atraumatic. Lungs:  Clear to auscultation.    Heart:  Regular rate and rhythm.   Impression/Plan: James Lucas is here for ophthalmic surgery.  Risks, benefits, limitations, and alternatives regarding ophthalmic surgery have been reviewed with the patient.  Questions have been answered.  All parties agreeable.   Annell Kidney, MD  04/26/2024, 8:35 AM

## 2024-04-27 ENCOUNTER — Encounter: Payer: Self-pay | Admitting: Ophthalmology

## 2024-05-04 ENCOUNTER — Other Ambulatory Visit: Payer: Self-pay

## 2024-05-08 ENCOUNTER — Encounter (INDEPENDENT_AMBULATORY_CARE_PROVIDER_SITE_OTHER): Payer: Self-pay

## 2024-05-08 ENCOUNTER — Other Ambulatory Visit (HOSPITAL_COMMUNITY): Payer: Self-pay

## 2024-05-09 ENCOUNTER — Other Ambulatory Visit: Payer: Self-pay

## 2024-05-09 NOTE — Progress Notes (Signed)
 Specialty Pharmacy Refill Coordination Note  James Lucas is a 83 y.o. male contacted today regarding refills of specialty medication(s) Abiraterone  Acetate (ZYTIGA )   Patient requested (Patient-Rptd) Delivery   Delivery date: 05/10/24   Verified address: (Patient-Rptd) [address], [place], [place] [address]   Medication will be filled on 06.24.25.

## 2024-06-09 ENCOUNTER — Other Ambulatory Visit: Payer: Self-pay

## 2024-06-09 ENCOUNTER — Encounter (INDEPENDENT_AMBULATORY_CARE_PROVIDER_SITE_OTHER): Payer: Self-pay

## 2024-06-09 NOTE — Progress Notes (Signed)
 Specialty Pharmacy Refill Coordination Note  James Lucas is a 83 y.o. male contacted today regarding refills of specialty medication(s) Abiraterone  Acetate (ZYTIGA )   Patient requested (Patient-Rptd) Delivery   Delivery date: 06/12/24   Verified address: (Patient-Rptd) [address] [place], [place] [address]   Medication will be filled on 07.25.25.

## 2024-06-19 ENCOUNTER — Other Ambulatory Visit: Payer: Self-pay

## 2024-06-19 NOTE — Progress Notes (Signed)
 Specialty Pharmacy Ongoing Clinical Assessment Note  James Lucas is a 83 y.o. male who is being followed by the specialty pharmacy service for RxSp Oncology   Patient's specialty medication(s) reviewed today: Abiraterone  Acetate (ZYTIGA )   Missed doses in the last 4 weeks: 0   Patient/Caregiver did not have any additional questions or concerns.   Therapeutic benefit summary: Patient is achieving benefit   Adverse events/side effects summary: Experienced adverse events/side effects (headache, lower extremity swelling, soft stool, hot flashes. All tolerable at this time)   Patient's therapy is appropriate to: Continue    Goals Addressed             This Visit's Progress    Slow Disease Progression   On track    Patient is on track. Patient will maintain adherence.  Patient's last PSA was 0.02 ng/ml (03/30/24).          Follow up: 6 months  [facility]

## 2024-06-28 ENCOUNTER — Encounter (INDEPENDENT_AMBULATORY_CARE_PROVIDER_SITE_OTHER): Payer: Self-pay

## 2024-06-29 ENCOUNTER — Other Ambulatory Visit: Payer: Self-pay

## 2024-06-29 ENCOUNTER — Other Ambulatory Visit (HOSPITAL_COMMUNITY): Payer: Self-pay

## 2024-06-29 ENCOUNTER — Other Ambulatory Visit: Payer: Self-pay | Admitting: Oncology

## 2024-06-29 DIAGNOSIS — C61 Malignant neoplasm of prostate: Secondary | ICD-10-CM

## 2024-06-29 MED ORDER — ABIRATERONE ACETATE 250 MG PO TABS
ORAL_TABLET | ORAL | 3 refills | Status: DC
  Filled 2024-06-29: qty 90, 30d supply, fill #0
  Filled 2024-07-27: qty 90, 30d supply, fill #1
  Filled 2024-08-21: qty 90, 30d supply, fill #2
  Filled 2024-09-14: qty 90, 30d supply, fill #3

## 2024-06-29 NOTE — Progress Notes (Signed)
 Specialty Pharmacy Refill Coordination Note  MyChart Questionnaire Submission  James Lucas is a 83 y.o. male contacted today regarding refills of specialty medication(s) Zytiga .  Doses on hand: (Patient-Rptd) about 5 - 30 tablets/10 days per last refill  Patient requested: (Patient-Rptd) Delivery   Delivery date: 07/03/24  Verified address: [address] [place] [address]  Medication will be filled on 06/30/24.  This fill date is pending response to refill request from provider. Patient is aware and if they have not received fill by intended date, they must follow up with pharmacy.

## 2024-06-30 ENCOUNTER — Other Ambulatory Visit (HOSPITAL_COMMUNITY): Payer: Self-pay

## 2024-07-03 ENCOUNTER — Other Ambulatory Visit: Payer: Self-pay

## 2024-07-13 ENCOUNTER — Other Ambulatory Visit: Payer: Self-pay | Admitting: Oncology

## 2024-07-26 ENCOUNTER — Encounter (INDEPENDENT_AMBULATORY_CARE_PROVIDER_SITE_OTHER): Payer: Self-pay

## 2024-07-26 ENCOUNTER — Other Ambulatory Visit (HOSPITAL_COMMUNITY): Payer: Self-pay

## 2024-07-27 ENCOUNTER — Other Ambulatory Visit (HOSPITAL_COMMUNITY): Payer: Self-pay

## 2024-07-27 ENCOUNTER — Other Ambulatory Visit: Payer: Self-pay

## 2024-07-27 NOTE — Progress Notes (Signed)
 Specialty Pharmacy Refill Coordination Note  MyChart Questionnaire Submission  James Lucas is a 83 y.o. male contacted today regarding refills of specialty medication(s) Zytiga .  Doses on hand: (Patient-Rptd) about 8?   Patient requested: (Patient-Rptd) Delivery   Delivery date: 07/28/24  Verified address: [address] [place] [address]  Medication will be filled on 07/27/24.

## 2024-08-17 ENCOUNTER — Other Ambulatory Visit: Payer: Self-pay

## 2024-08-20 ENCOUNTER — Encounter (INDEPENDENT_AMBULATORY_CARE_PROVIDER_SITE_OTHER): Payer: Self-pay

## 2024-08-21 ENCOUNTER — Other Ambulatory Visit (HOSPITAL_COMMUNITY): Payer: Self-pay

## 2024-08-21 ENCOUNTER — Other Ambulatory Visit: Payer: Self-pay

## 2024-08-21 NOTE — Progress Notes (Signed)
 Specialty Pharmacy Refill Coordination Note  MyChart Questionnaire Submission  James Lucas is a 83 y.o. male contacted today regarding refills of specialty medication(s) Zytiga .  Doses on hand: (Patient-Rptd) About 4 or 5   Patient requested: (Patient-Rptd) Delivery   Delivery date: 08/23/24  Verified address: [address] [place] [address]  Medication will be filled on 08/22/24.

## 2024-09-13 ENCOUNTER — Other Ambulatory Visit: Payer: Self-pay | Admitting: Oncology

## 2024-09-14 ENCOUNTER — Other Ambulatory Visit: Payer: Self-pay

## 2024-09-14 ENCOUNTER — Encounter (INDEPENDENT_AMBULATORY_CARE_PROVIDER_SITE_OTHER): Payer: Self-pay

## 2024-09-15 ENCOUNTER — Encounter: Payer: Self-pay | Admitting: Oncology

## 2024-09-15 ENCOUNTER — Other Ambulatory Visit: Payer: Self-pay

## 2024-09-15 NOTE — Progress Notes (Signed)
 Specialty Pharmacy Refill Coordination Note  James Lucas is a 83 y.o. male contacted today regarding refills of specialty medication(s) Abiraterone  Acetate (ZYTIGA )   Patient requested Delivery   Delivery date: 09/19/24   Verified address: [address] [place], [place]. [address]   Medication will be filled on: 09/18/24

## 2024-09-15 NOTE — Telephone Encounter (Signed)
 Encounter opened in error.

## 2024-09-18 ENCOUNTER — Other Ambulatory Visit: Payer: Self-pay
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
## 2024-10-04 ENCOUNTER — Telehealth: Payer: Self-pay | Admitting: Pharmacy Technician

## 2024-10-04 ENCOUNTER — Other Ambulatory Visit (HOSPITAL_COMMUNITY): Payer: Self-pay

## 2024-10-04 ENCOUNTER — Encounter: Payer: Self-pay | Admitting: Oncology

## 2024-10-04 NOTE — Telephone Encounter (Signed)
 Oral Oncology Patient Advocate Encounter   Was successful in securing patient a $3500 grant from [employer] ([employer]) to provide copayment coverage for abiraterone .  This will keep the out of pocket expense at $0.     The billing information is as follows and has been shared with [facility].   RxBin: [id number] PCN:  [id number] Member ID: [id number] Group ID: [id number] Dates of Eligibility: 04/07/2024 through 10/04/2025    Jorita Bohanon (Patty) Chet Burnet, CPhT  [facility], Zelda Salmon, [facility] - Oral Chemotherapy Patient Advocate Specialist III Phone: [phone]  Fax: [phone]

## 2024-10-05 ENCOUNTER — Inpatient Hospital Stay: Attending: Oncology

## 2024-10-05 ENCOUNTER — Inpatient Hospital Stay

## 2024-10-05 ENCOUNTER — Inpatient Hospital Stay: Admitting: Oncology

## 2024-10-05 ENCOUNTER — Other Ambulatory Visit: Payer: Self-pay | Admitting: Oncology

## 2024-10-05 ENCOUNTER — Encounter: Payer: Self-pay | Admitting: Oncology

## 2024-10-05 VITALS — BP 140/76 | HR 92 | Temp 98.0°F | Resp 18 | Wt 228.0 lb

## 2024-10-05 DIAGNOSIS — I10 Essential (primary) hypertension: Secondary | ICD-10-CM | POA: Diagnosis not present

## 2024-10-05 DIAGNOSIS — Z7952 Long term (current) use of systemic steroids: Secondary | ICD-10-CM | POA: Insufficient documentation

## 2024-10-05 DIAGNOSIS — C61 Malignant neoplasm of prostate: Secondary | ICD-10-CM

## 2024-10-05 DIAGNOSIS — D649 Anemia, unspecified: Secondary | ICD-10-CM | POA: Diagnosis not present

## 2024-10-05 DIAGNOSIS — Z87891 Personal history of nicotine dependence: Secondary | ICD-10-CM | POA: Diagnosis not present

## 2024-10-05 DIAGNOSIS — Z8551 Personal history of malignant neoplasm of bladder: Secondary | ICD-10-CM | POA: Insufficient documentation

## 2024-10-05 DIAGNOSIS — Z7982 Long term (current) use of aspirin: Secondary | ICD-10-CM | POA: Diagnosis not present

## 2024-10-05 DIAGNOSIS — Z79818 Long term (current) use of other agents affecting estrogen receptors and estrogen levels: Secondary | ICD-10-CM | POA: Diagnosis not present

## 2024-10-05 DIAGNOSIS — Z905 Acquired absence of kidney: Secondary | ICD-10-CM | POA: Diagnosis not present

## 2024-10-05 DIAGNOSIS — Z79899 Other long term (current) drug therapy: Secondary | ICD-10-CM | POA: Insufficient documentation

## 2024-10-05 LAB — CMP (CANCER CENTER ONLY)
ALT: 15 U/L (ref 0–44)
AST: 22 U/L (ref 15–41)
Albumin: 4.2 g/dL (ref 3.5–5.0)
Alkaline Phosphatase: 51 U/L (ref 38–126)
Anion gap: 9 (ref 5–15)
BUN: 18 mg/dL (ref 8–23)
CO2: 29 mmol/L (ref 22–32)
Calcium: 9.3 mg/dL (ref 8.9–10.3)
Chloride: 98 mmol/L (ref 98–111)
Creatinine: 1.09 mg/dL (ref 0.61–1.24)
GFR, Estimated: >60 mL/min (ref >60)
Glucose, Bld: 102 mg/dL — ABNORMAL HIGH (ref 70–99)
Potassium: 4.5 mmol/L (ref 3.5–5.1)
Sodium: 136 mmol/L (ref 135–145)
Total Bilirubin: 0.8 mg/dL (ref 0.0–1.2)
Total Protein: 7 g/dL (ref 6.5–8.1)

## 2024-10-05 LAB — CBC WITH DIFFERENTIAL (CANCER CENTER ONLY)
Abs Immature Granulocytes: 0.04 K/uL (ref 0.00–0.07)
Basophils Absolute: 0.1 K/uL (ref 0.0–0.1)
Basophils Relative: 1 %
Eosinophils Absolute: 0.3 K/uL (ref 0.0–0.5)
Eosinophils Relative: 3 %
HCT: 38.5 % — ABNORMAL LOW (ref 39.0–52.0)
Hemoglobin: 13 g/dL (ref 13.0–17.0)
Immature Granulocytes: 0 %
Lymphocytes Relative: 20 %
Lymphs Abs: 2 K/uL (ref 0.7–4.0)
MCH: 32.3 pg (ref 26.0–34.0)
MCHC: 33.8 g/dL (ref 30.0–36.0)
MCV: 95.5 fL (ref 80.0–100.0)
Monocytes Absolute: 0.6 K/uL (ref 0.1–1.0)
Monocytes Relative: 6 %
Neutro Abs: 7 K/uL (ref 1.7–7.7)
Neutrophils Relative %: 70 %
Platelet Count: 197 K/uL (ref 150–400)
RBC: 4.03 MIL/uL — ABNORMAL LOW (ref 4.22–5.81)
RDW: 11.9 % (ref 11.5–15.5)
WBC Count: 10 K/uL (ref 4.0–10.5)
nRBC: 0 % (ref 0.0–0.2)

## 2024-10-05 LAB — PSA: Prostatic Specific Antigen: <0.02 ng/mL (ref 0.00–4.00)

## 2024-10-05 MED ORDER — LEUPROLIDE ACETATE (6 MONTH) 45 MG ~~LOC~~ KIT
45.0000 mg | PACK | Freq: Once | SUBCUTANEOUS | Status: AC
  Administered 2024-10-05: 45 mg via SUBCUTANEOUS
  Filled 2024-10-05: qty 45

## 2024-10-05 MED ORDER — PREDNISONE 5 MG PO TABS: 5.0000 mg | ORAL_TABLET | Freq: Every day | ORAL | 5 refills | Status: DC

## 2024-10-05 NOTE — Assessment & Plan Note (Addendum)
 Eligard  45mg  every 6 months, proceed today- next due May 2026

## 2024-10-05 NOTE — Assessment & Plan Note (Signed)
 currently on Triamterene -hydrochlorothiazide 37.5-25 mg daily.    Norvasc 2.5 mg BP is stable and well controlled.

## 2024-10-05 NOTE — Progress Notes (Signed)
 Hematology/Oncology Progress note [phone] [phone]      Chief Complaint: James Limas is a 83 y.o. male with a long-standing history of prostate cancer who presents for follow up.   ASSESSMENT & PLAN:   Prostate cancer [facility]) # Recurrent prostate Cancer- diagnosed 2009 s/p cryotherapy and biochemical recurrence in 2010, s/p IMRT, 08/2020 biopsy proven persistent local recurrence, not a candidate for additional RT. Castration sensitive local recurrence.  Currently receiving Eligard  every 6 months  Continue Abiateron 750 mg plus prednisone  5 mg daily.  PSA <0.02, stable.  He takes omeprazole  20mg  daily PRN heart burn.   Encounter for monitoring androgen deprivation therapy Eligard  45mg  every 6 months, proceed today- next due May 2026  Essential hypertension currently on Triamterene -hydrochlorothiazide  37.5-25 mg daily.    Norvasc  2.5 mg BP is stable and well controlled.  Normocytic anemia Stable. Monitor   Orders Placed This Encounter  Procedures   CMP (Cancer Center only)    Standing Status:   Future    Number of Occurrences:   1    Expected Date:   10/05/2024    Expiration Date:   01/03/2025   CBC with Differential (Cancer Center Only)    Standing Status:   Future    Number of Occurrences:   1    Expected Date:   10/05/2024    Expiration Date:   01/03/2025   Follow up in 3 months.  All questions were answered. The patient knows to call the clinic with any problems, questions or concerns.  Zelphia Cap, MD, PhD [facility] Hematology Oncology 10/05/2024   PERTINENT ONCOLOGY HISTORY Patient previously followed up by Dr.Corcoran, patient switched care to me on 07/03/21 Extensive medical record review was performed by me Per note,  # 2009 prostate cancer .  Gleason score was 4+3 in 2009.  He received cryoablation. # 2010 PSA rose to 1.4, biochemical recurrence.  Prostate biopsy in 04/2009 revealed Gleason 5+4 adenocarcinoma.  He received IMRT  with adjuvant androgen deprivation therapy.  PSA nadir was < 0.1.  05/23/2014  Prostate MRI revealed areas suspicious for extracapsular extension in the right posterior apex and along the posterior aspect of the anterior urethra.  ADT was discussed but due to history of side effects he elected to continue surveillance.  #2016 PSA increased and he was started on Lupron , intermittently secondary to side effects.  # 01/2016 PSA increased to 3.64 05/01/2016 CT revealed no evidence of recurrent or metastatic disease. Bone scan on 05/01/2016 showed no evidence of osseous metastatic disease # 07/10/2016 Seen by Dr.Finnegan, started on Leupron.  # 10/26/2016 Dr. Neysa Kras at [facility] recommended discontinuation of Lupron  as his PSA was < 4.0. He was to pursue intermittent therapy when PSA increased to about 10.  Testosterone  and PSA checks were recommended every 2 months and PET/CT scan with F-18 fluciclovine to assess for oligomenorrhea static disease if PSA began to rise.  12/02/2017 Axumin  PET scan revealed potential local recurrence in the residual left prostate gland, with the region of activity relatively small at approximately 1.5 cm. No evidence of metastatic disease in the abdomen pelvis or skeleton 09/02/2018 by Dr. Jacobo.  At that time, PSA was 5.98.  He agreed to reinitiate Lupron  (received 09/12/2018).  07/02/2020 PET Axumin  scan on  revealed intense radiotracer activity in the LEFT prostate gland concern for residual carcinoma.  There was no significant change from PET-CT 09/09/2018.  There was no nodal metastasis or skeletal metastasis.  He is s/p RIGHT nephrectomy.  08/22/2020 revealed prostatic adenocarcinoma in 9/12 (six from the left side; 3 from the right side) biopsies.  The tumor showed mixed morphology with some areas showing ADT treatment effect and other areas without significant treatment effect.  Pattern was Gleason 4 and 5 in areas of less treatment effect.  The background prostate  was fibrotic c/w radiation effect.   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  He undergoes surveillance cystoscopy by Dr. Jillyn [facility]).   Last cystoscopy was normal on 01/12/2019.  on Eligard  45mg  Q6 months. Last received on 04/02/2021 07/10/2021, patient started on Abiateron 750 mg daily and prednisone  5 mg daily.  Interval History:  INTERVAL HISTORY James Lucas is a 83 y.o. male who has above history reviewed by me today presents for follow up visit for management of prostate cancer He missed his appt recently, and rescheduled to today.  He is on Abiaterone 750mg  daily and prednisone  5mg  daily, tolerates well.  Patient reports being compliant with medications.   Review of Systems  Constitutional:  Positive for fatigue. Negative for appetite change, chills, fever and unexpected weight change.  HENT:   Negative for hearing loss and voice change.   Eyes:  Negative for eye problems and icterus.  Respiratory:  Negative for chest tightness, cough and shortness of breath.   Cardiovascular:  Negative for chest pain and leg swelling.  Gastrointestinal:  Negative for abdominal distention and abdominal pain.  Endocrine: Positive for hot flashes.  Genitourinary:  Negative for difficulty urinating, dysuria and frequency.   Skin:  Negative for itching and rash.  Neurological:  Negative for light-headedness and numbness.  Hematological:  Negative for adenopathy. Does not bruise/bleed easily.  Psychiatric/Behavioral:  Negative for confusion.      Past Medical History:  Diagnosis Date   Allergic rhinitis    Bladder cancer (HCC)    BPH (benign prostatic hyperplasia)    Chronic back pain    Essential hypertension    Glaucoma    Impotence of organic origin    Liver laceration    Prostate cancer (HCC)    Solitary kidney, acquired    left kidney remains   Stenosis, spinal, lumbar    Vertigo    x1. several  years ago   Wears dentures    full upper, partial lower    Past Surgical History:  Procedure Laterality Date   BACK SURGERY     CATARACT EXTRACTION W/PHACO Right 04/12/2024   Procedure: PHACOEMULSIFICATION, CATARACT, WITH IOL INSERTION 8.07, 00:44.2;  Surgeon: Mittie Gaskin, MD;  Location: [facility];  Service: Ophthalmology;  Laterality: Right;   CATARACT EXTRACTION W/PHACO Left 04/26/2024   Procedure: PHACOEMULSIFICATION, CATARACT, WITH IOL INSERTION 5.30, 00:29.0;  Surgeon: Mittie Gaskin, MD;  Location: [facility];  Service: Ophthalmology;  Laterality: Left;   COLONOSCOPY     COLONOSCOPY WITH PROPOFOL  N/A 08/24/2019   Procedure: COLONOSCOPY WITH PROPOFOL ;  Surgeon: Gaylyn Gladis PENNER, MD;  Location: [facility];  Service: Endoscopy;  Laterality: N/A;   HERNIA REPAIR     NEPHRECTOMY Right 1978   TRANSURETHRAL RESECTION OF BLADDER TUMOR WITH GYRUS (TURBT-GYRUS)      Family History  Problem Relation Age of Onset   Cancer Father     Social History:  reports that he quit smoking about 60 years ago. His smoking use included cigarettes. He has never used smokeless tobacco. He reports that he does not drink alcohol and does not use  drugs.  Allergies:  Allergies  Allergen Reactions   Ace Inhibitors Cough    Current Medications: Current Outpatient Medications  Medication Sig Dispense Refill   abiraterone  acetate (ZYTIGA ) 250 MG tablet TAKE 3 TABLETS BY MOUTH DAILY. TAKE ON AN EMPTY STOMACH 1 HOUR BEFORE OR 2 HOURS AFTER A MEAL 90 tablet 3   amLODipine  (NORVASC ) 2.5 MG tablet Take 1 tablet (2.5 mg total) by mouth See admin instructions. Take 1 tablet if systolic BP is persistently above 150 90 tablet 1   aspirin EC 81 MG tablet Take 81 mg by mouth daily.      calcium -vitamin D (OSCAL WITH D) 500-5 MG-MCG tablet Take 1 tablet by mouth daily. 90 tablet 3   ferrous sulfate 325 (65 FE) MG tablet Take by mouth.     Iodine, Kelp, (KELP PO) Take 600 mg by  mouth daily.     latanoprost  (XALATAN ) 0.005 % ophthalmic solution Place 1 drop into both eyes at bedtime.      Leuprolide  Acetate (LUPRON  IJ) Inject as directed every 6 (six) months.     montelukast  (SINGULAIR ) 10 MG tablet Take 10 mg by mouth daily.      Multiple Vitamin (MULTI-VITAMIN) tablet Take 1 tablet by mouth daily.     omeprazole  (PRILOSEC) 20 MG capsule TAKE 1 CAPSULE BY MOUTH DAILY AS NEEDED. 90 capsule 0   OVER THE COUNTER MEDICATION Take 800 mg by mouth daily. Sea moss     triamterene-hydrochlorothiazide  (MAXZIDE-25) 37.5-25 MG tablet Take 1 tablet by mouth daily.     vitamin B-12 (CYANOCOBALAMIN) 1000 MCG tablet Take 1,000 mcg by mouth daily.      predniSONE  (DELTASONE ) 5 MG tablet Take 1 tablet (5 mg total) by mouth daily with breakfast. 30 tablet 5   No current facility-administered medications for this visit.     Performance status (ECOG): 1  Vital Signs: Blood pressure (!) 140/76, pulse 92, temperature 98 F (36.7 C), temperature source Tympanic, resp. rate 18, weight 228 lb (103.4 kg), SpO2 100%. Physical Exam Constitutional:      General: He is not in acute distress.    Appearance: He is not diaphoretic.  HENT:     Head: Normocephalic and atraumatic.  Eyes:     General: No scleral icterus. Cardiovascular:     Rate and Rhythm: Normal rate and regular rhythm.  Pulmonary:     Effort: Pulmonary effort is normal. No respiratory distress.     Breath sounds: Normal breath sounds.  Abdominal:     General: Bowel sounds are normal. There is no distension.     Palpations: Abdomen is soft.     Tenderness: There is no abdominal tenderness.  Musculoskeletal:        General: Normal range of motion.     Cervical back: Normal range of motion and neck supple.  Skin:    General: Skin is warm and dry.     Findings: No erythema.  Neurological:     Mental Status: He is alert and oriented to person, place, and time.     Cranial Nerves: No cranial nerve deficit.     Motor:  No abnormal muscle tone.     Coordination: Coordination normal.  Psychiatric:        Mood and Affect: Mood and affect normal.    Labs    Latest Ref Rng & Units 10/05/2024   11:53 AM 03/30/2024   10:42 AM 01/07/2024    9:45 AM  CBC  WBC 4.0 - 10.5  K/uL 10.0  9.7  9.7   Hemoglobin 13.0 - 17.0 g/dL 86.9  87.6  87.7   Hematocrit 39.0 - 52.0 % 38.5  37.5  37.1   Platelets 150 - 400 K/uL 197  196  202       Latest Ref Rng & Units 10/05/2024   11:53 AM 03/30/2024   10:42 AM 01/07/2024    9:45 AM  CMP  Glucose 70 - 99 mg/dL 897  896  880   BUN 8 - 23 mg/dL 18  15  14    Creatinine 0.61 - 1.24 mg/dL 8.90  9.05  8.93   Sodium 135 - 145 mmol/L 136  140  142   Potassium 3.5 - 5.1 mmol/L 4.5  4.9  4.3   Chloride 98 - 111 mmol/L 98  100  103   CO2 22 - 32 mmol/L 29  29  31    Calcium  8.9 - 10.3 mg/dL 9.3  9.1  9.4   Total Protein 6.5 - 8.1 g/dL 7.0  6.5  6.3   Total Bilirubin 0.0 - 1.2 mg/dL 0.8  0.7  0.8   Alkaline Phos 38 - 126 U/L 51  49  43   AST 15 - 41 U/L 22  22  20    ALT 0 - 44 U/L 15  14  14

## 2024-10-05 NOTE — Assessment & Plan Note (Addendum)
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 Castration sensitive local recurrence.  Currently receiving Eligard  every 6 months  Continue Abiateron 750 mg plus prednisone  5 mg daily.  PSA <0.02, stable.  He takes omeprazole  20mg  daily PRN heart burn.

## 2024-10-05 NOTE — Assessment & Plan Note (Signed)
 Stable.  Monitor.

## 2024-10-11 ENCOUNTER — Other Ambulatory Visit: Payer: Self-pay

## 2024-10-11 ENCOUNTER — Other Ambulatory Visit: Payer: Self-pay | Admitting: Oncology

## 2024-10-11 ENCOUNTER — Other Ambulatory Visit (HOSPITAL_COMMUNITY): Payer: Self-pay

## 2024-10-11 DIAGNOSIS — C61 Malignant neoplasm of prostate: Secondary | ICD-10-CM

## 2024-10-11 MED ORDER — ABIRATERONE ACETATE 250 MG PO TABS
ORAL_TABLET | ORAL | 3 refills | Status: AC
  Filled 2024-10-11: qty 90, fill #0
  Filled 2024-10-13: qty 90, 30d supply, fill #0
  Filled 2024-11-08: qty 90, 30d supply, fill #1
  Filled 2024-12-06: qty 90, 30d supply, fill #2

## 2024-10-12 ENCOUNTER — Encounter (INDEPENDENT_AMBULATORY_CARE_PROVIDER_SITE_OTHER): Payer: Self-pay

## 2024-10-13 ENCOUNTER — Other Ambulatory Visit: Payer: Self-pay

## 2024-10-13 NOTE — Progress Notes (Signed)
 Specialty Pharmacy Refill Coordination Note  James Lucas is a 83 y.o. male contacted today regarding refills of specialty medication(s) Abiraterone  Acetate (ZYTIGA )   Patient requested Delivery   Delivery date: 10/16/24   Verified address: [address] [place], [place] [address]   Medication will be filled on: 10/13/24

## 2024-10-23 ENCOUNTER — Other Ambulatory Visit: Payer: Self-pay | Admitting: Oncology

## 2024-10-25 ENCOUNTER — Encounter: Payer: Self-pay | Admitting: Oncology

## 2024-11-08 ENCOUNTER — Other Ambulatory Visit: Payer: Self-pay

## 2024-11-10 ENCOUNTER — Other Ambulatory Visit: Payer: Self-pay

## 2024-11-10 NOTE — Progress Notes (Signed)
 Specialty Pharmacy Refill Coordination Note  James Lucas is a 83 y.o. male contacted today regarding refills of specialty medication(s) Abiraterone  Acetate (ZYTIGA )   Patient requested Delivery   Delivery date: 11/14/24   Verified address: [address] [place] [address]   Medication will be filled on: 11/13/24

## 2024-11-13 ENCOUNTER — Other Ambulatory Visit: Payer: Self-pay

## 2024-12-06 ENCOUNTER — Other Ambulatory Visit (HOSPITAL_COMMUNITY): Payer: Self-pay

## 2024-12-08 ENCOUNTER — Other Ambulatory Visit (HOSPITAL_COMMUNITY): Payer: Self-pay

## 2024-12-08 ENCOUNTER — Other Ambulatory Visit: Payer: Self-pay

## 2024-12-08 NOTE — Progress Notes (Signed)
 Specialty Pharmacy Refill Coordination Note  James Lucas is a 84 y.o. male contacted today regarding refills of specialty medication(s) Abiraterone  Acetate (ZYTIGA )   Patient requested Delivery   Delivery date: 12/14/24   Verified address: [address] [place] [address]   Medication will be filled on: 12/13/24

## 2024-12-12 ENCOUNTER — Other Ambulatory Visit: Payer: Self-pay

## 2024-12-13 ENCOUNTER — Other Ambulatory Visit: Payer: Self-pay

## 2024-12-13 ENCOUNTER — Encounter: Payer: Self-pay | Admitting: Pharmacist

## 2024-12-13 ENCOUNTER — Other Ambulatory Visit: Payer: Self-pay | Admitting: Pharmacist

## 2024-12-13 DIAGNOSIS — C61 Malignant neoplasm of prostate: Secondary | ICD-10-CM

## 2024-12-13 MED ORDER — PREDNISONE 5 MG PO TABS: 5.0000 mg | ORAL_TABLET | Freq: Every day | ORAL | 1 refills | Status: AC

## 2024-12-13 NOTE — Telephone Encounter (Signed)
 Patient called to report that [facility] home delivery pharmacy would not fill prednisone  for patient since it was written as a 30-day supply.  Patient requested that prescription be resent for 90-day supply so he can fill it with [facility].  Prescription sent per patient's request.

## 2024-12-15 ENCOUNTER — Other Ambulatory Visit: Payer: Self-pay

## 2025-01-09 ENCOUNTER — Inpatient Hospital Stay: Admitting: Oncology

## 2025-01-09 ENCOUNTER — Inpatient Hospital Stay
